# Patient Record
Sex: Male | Born: 1950 | Race: Black or African American | Hispanic: No | Marital: Married | Smoking: Current every day smoker
Health system: Southern US, Community
[De-identification: ages and names within clinical notes are randomized; demographics above are authoritative.]

## PROBLEM LIST (undated history)

## (undated) ENCOUNTER — Emergency Department (HOSPITAL_COMMUNITY): Payer: Medicare Other | Source: Home / Self Care

## (undated) ENCOUNTER — Emergency Department (HOSPITAL_COMMUNITY): Admission: EM | Payer: Medicare Other | Source: Home / Self Care

## (undated) DIAGNOSIS — D649 Anemia, unspecified: Secondary | ICD-10-CM

## (undated) DIAGNOSIS — F1910 Other psychoactive substance abuse, uncomplicated: Secondary | ICD-10-CM

## (undated) DIAGNOSIS — H409 Unspecified glaucoma: Secondary | ICD-10-CM

## (undated) DIAGNOSIS — N318 Other neuromuscular dysfunction of bladder: Secondary | ICD-10-CM

## (undated) DIAGNOSIS — G40909 Epilepsy, unspecified, not intractable, without status epilepticus: Secondary | ICD-10-CM

## (undated) DIAGNOSIS — Z9289 Personal history of other medical treatment: Secondary | ICD-10-CM

## (undated) DIAGNOSIS — S02400A Malar fracture unspecified, initial encounter for closed fracture: Secondary | ICD-10-CM

## (undated) DIAGNOSIS — F419 Anxiety disorder, unspecified: Secondary | ICD-10-CM

## (undated) DIAGNOSIS — S02401A Maxillary fracture, unspecified, initial encounter for closed fracture: Secondary | ICD-10-CM

## (undated) DIAGNOSIS — K5900 Constipation, unspecified: Secondary | ICD-10-CM

## (undated) DIAGNOSIS — I10 Essential (primary) hypertension: Secondary | ICD-10-CM

## (undated) DIAGNOSIS — R58 Hemorrhage, not elsewhere classified: Secondary | ICD-10-CM

## (undated) DIAGNOSIS — T7840XA Allergy, unspecified, initial encounter: Secondary | ICD-10-CM

## (undated) DIAGNOSIS — K219 Gastro-esophageal reflux disease without esophagitis: Secondary | ICD-10-CM

## (undated) DIAGNOSIS — B1920 Unspecified viral hepatitis C without hepatic coma: Secondary | ICD-10-CM

## (undated) DIAGNOSIS — K759 Inflammatory liver disease, unspecified: Secondary | ICD-10-CM

## (undated) DIAGNOSIS — S82251C Displaced comminuted fracture of shaft of right tibia, initial encounter for open fracture type IIIA, IIIB, or IIIC: Secondary | ICD-10-CM

## (undated) DIAGNOSIS — F0390 Unspecified dementia without behavioral disturbance: Secondary | ICD-10-CM

## (undated) DIAGNOSIS — F329 Major depressive disorder, single episode, unspecified: Secondary | ICD-10-CM

## (undated) DIAGNOSIS — S069X9A Unspecified intracranial injury with loss of consciousness of unspecified duration, initial encounter: Secondary | ICD-10-CM

## (undated) DIAGNOSIS — F32A Depression, unspecified: Secondary | ICD-10-CM

## (undated) DIAGNOSIS — J45909 Unspecified asthma, uncomplicated: Secondary | ICD-10-CM

## (undated) DIAGNOSIS — S72402A Unspecified fracture of lower end of left femur, initial encounter for closed fracture: Secondary | ICD-10-CM

## (undated) HISTORY — DX: Anemia, unspecified: D64.9

## (undated) HISTORY — DX: Allergy, unspecified, initial encounter: T78.40XA

## (undated) HISTORY — DX: Other psychoactive substance abuse, uncomplicated: F19.10

## (undated) HISTORY — DX: Anxiety disorder, unspecified: F41.9

## (undated) HISTORY — PX: ORIF ANKLE FRACTURE: SHX5408

## (undated) HISTORY — DX: Epilepsy, unspecified, not intractable, without status epilepticus: G40.909

---
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
## 2000-10-07 ENCOUNTER — Emergency Department (HOSPITAL_COMMUNITY): Admission: EM | Admit: 2000-10-07 | Discharge: 2000-10-08 | Payer: Self-pay | Admitting: *Deleted

## 2004-03-14 ENCOUNTER — Ambulatory Visit: Payer: Self-pay | Admitting: Family Medicine

## 2004-04-03 ENCOUNTER — Ambulatory Visit: Payer: Self-pay | Admitting: *Deleted

## 2004-07-05 ENCOUNTER — Ambulatory Visit (HOSPITAL_COMMUNITY): Admission: RE | Admit: 2004-07-05 | Discharge: 2004-07-05 | Payer: Self-pay | Admitting: Gastroenterology

## 2004-07-05 ENCOUNTER — Encounter (INDEPENDENT_AMBULATORY_CARE_PROVIDER_SITE_OTHER): Payer: Self-pay | Admitting: *Deleted

## 2004-09-11 ENCOUNTER — Ambulatory Visit: Payer: Self-pay | Admitting: Internal Medicine

## 2005-06-04 ENCOUNTER — Emergency Department (HOSPITAL_COMMUNITY): Admission: EM | Admit: 2005-06-04 | Discharge: 2005-06-04 | Payer: Self-pay | Admitting: Emergency Medicine

## 2005-09-19 ENCOUNTER — Emergency Department (HOSPITAL_COMMUNITY): Admission: EM | Admit: 2005-09-19 | Discharge: 2005-09-20 | Payer: Self-pay | Admitting: Emergency Medicine

## 2006-07-23 ENCOUNTER — Emergency Department (HOSPITAL_COMMUNITY): Admission: EM | Admit: 2006-07-23 | Discharge: 2006-07-24 | Payer: Self-pay | Admitting: Emergency Medicine

## 2006-08-19 ENCOUNTER — Emergency Department (HOSPITAL_COMMUNITY): Admission: EM | Admit: 2006-08-19 | Discharge: 2006-08-19 | Payer: Self-pay | Admitting: Emergency Medicine

## 2006-11-16 ENCOUNTER — Emergency Department (HOSPITAL_COMMUNITY): Admission: EM | Admit: 2006-11-16 | Discharge: 2006-11-16 | Payer: Self-pay | Admitting: Emergency Medicine

## 2007-05-16 ENCOUNTER — Ambulatory Visit: Payer: Self-pay | Admitting: Family Medicine

## 2007-05-16 LAB — CONVERTED CEMR LAB
AST: 49 units/L — ABNORMAL HIGH (ref 0–37)
BUN: 8 mg/dL (ref 6–23)
Basophils Relative: 0 % (ref 0–1)
CO2: 22 meq/L (ref 19–32)
Calcium: 9.7 mg/dL (ref 8.4–10.5)
Chloride: 106 meq/L (ref 96–112)
Creatinine, Ser: 0.83 mg/dL (ref 0.40–1.50)
Glucose, Bld: 90 mg/dL (ref 70–99)
Hemoglobin: 15.2 g/dL (ref 13.0–17.0)
Lymphs Abs: 2.4 10*3/uL (ref 0.7–4.0)
MCHC: 34.5 g/dL (ref 30.0–36.0)
Monocytes Absolute: 0.6 10*3/uL (ref 0.1–1.0)
Monocytes Relative: 10 % (ref 3–12)
Neutro Abs: 3.2 10*3/uL (ref 1.7–7.7)
PSA: 0.78 ng/mL (ref 0.10–4.00)
RBC: 4.81 M/uL (ref 4.22–5.81)
WBC: 6.3 10*3/uL (ref 4.0–10.5)

## 2008-04-06 ENCOUNTER — Ambulatory Visit: Payer: Self-pay | Admitting: Family Medicine

## 2008-11-25 ENCOUNTER — Ambulatory Visit: Payer: Self-pay | Admitting: Family Medicine

## 2008-12-22 ENCOUNTER — Encounter: Payer: Self-pay | Admitting: Internal Medicine

## 2008-12-22 ENCOUNTER — Ambulatory Visit (HOSPITAL_COMMUNITY): Admission: RE | Admit: 2008-12-22 | Discharge: 2008-12-22 | Payer: Self-pay | Admitting: Gastroenterology

## 2008-12-25 ENCOUNTER — Encounter: Payer: Self-pay | Admitting: Internal Medicine

## 2009-04-08 ENCOUNTER — Ambulatory Visit: Payer: Self-pay | Admitting: Family Medicine

## 2009-04-08 LAB — CONVERTED CEMR LAB
ALT: 62 units/L — ABNORMAL HIGH (ref 0–53)
Alkaline Phosphatase: 107 units/L (ref 39–117)
Basophils Absolute: 0 10*3/uL (ref 0.0–0.1)
Creatinine, Ser: 0.78 mg/dL (ref 0.40–1.50)
HCV Quantitative: 2830000 intl units/mL — ABNORMAL HIGH (ref <43)
INR: 1.1 (ref 0.0–1.5)
Lymphocytes Relative: 35 % (ref 12–46)
Lymphs Abs: 1.5 10*3/uL (ref 0.7–4.0)
Neutro Abs: 2.3 10*3/uL (ref 1.7–7.7)
Neutrophils Relative %: 52 % (ref 43–77)
Platelets: 179 10*3/uL (ref 150–400)
RDW: 13.6 % (ref 11.5–15.5)
Sodium: 141 meq/L (ref 135–145)
Total Bilirubin: 0.9 mg/dL (ref 0.3–1.2)
Total Protein: 7.2 g/dL (ref 6.0–8.3)
Vit D, 25-Hydroxy: 16 ng/mL — ABNORMAL LOW (ref 30–89)
WBC: 4.4 10*3/uL (ref 4.0–10.5)

## 2009-05-26 ENCOUNTER — Ambulatory Visit: Payer: Self-pay | Admitting: Gastroenterology

## 2009-10-31 ENCOUNTER — Emergency Department (HOSPITAL_COMMUNITY): Admission: EM | Admit: 2009-10-31 | Discharge: 2009-10-31 | Payer: Self-pay | Admitting: Emergency Medicine

## 2010-05-23 ENCOUNTER — Emergency Department (HOSPITAL_COMMUNITY)
Admission: EM | Admit: 2010-05-23 | Discharge: 2010-05-23 | Payer: Self-pay | Source: Home / Self Care | Admitting: Emergency Medicine

## 2010-09-05 LAB — COMPREHENSIVE METABOLIC PANEL
ALT: 177 U/L — ABNORMAL HIGH (ref 0–53)
Albumin: 3.8 g/dL (ref 3.5–5.2)
Alkaline Phosphatase: 109 U/L (ref 39–117)
BUN: 6 mg/dL (ref 6–23)
Chloride: 108 mEq/L (ref 96–112)
Potassium: 3.7 mEq/L (ref 3.5–5.1)
Sodium: 138 mEq/L (ref 135–145)
Total Bilirubin: 1 mg/dL (ref 0.3–1.2)
Total Protein: 6.7 g/dL (ref 6.0–8.3)

## 2010-12-12 ENCOUNTER — Encounter (HOSPITAL_BASED_OUTPATIENT_CLINIC_OR_DEPARTMENT_OTHER): Payer: Self-pay | Admitting: Psychology

## 2010-12-12 DIAGNOSIS — F1020 Alcohol dependence, uncomplicated: Secondary | ICD-10-CM

## 2010-12-12 DIAGNOSIS — F1220 Cannabis dependence, uncomplicated: Secondary | ICD-10-CM

## 2010-12-22 ENCOUNTER — Encounter (HOSPITAL_COMMUNITY): Payer: Self-pay | Admitting: Psychology

## 2010-12-29 ENCOUNTER — Encounter (HOSPITAL_COMMUNITY): Payer: Self-pay | Admitting: Psychology

## 2010-12-31 ENCOUNTER — Telehealth: Payer: Self-pay | Admitting: Gastroenterology

## 2010-12-31 NOTE — Telephone Encounter (Signed)
FINAL REPORT    [facility]                            [place], [place] [address]    Patient Name:  Stephen, Buckley Medical Record Number  [id number] Date of Service 09/06/2010 Attending Psychologist Stephen Buckley, Ph.D.  Primary Diagnosis: Chronic Hepatitis C (070.54) Evaluation Duration: 1 hour  REFERRING PHYSICIAN: Tina Griffiths, PA-C, and Dr. Jacqualine Buckley, [facility] Hepatology   CONFIDENTIAL PSYCHOLOGICAL EVALUATIOON  Stephen Buckley is a 60 year old partnered African-American male from [place], who has been diagnosed with hepatitis C (HCV). He was seen in consultation today at the request of Stephen Griffiths, PA-C, to assess his psychosocial functioning and appropriateness for HCV treatment.  BEHAVIORAL OBSERVATIONS: On time; brought to visit by partner but unwilling to have her in the interview.  MENTAL STATUS EXAM:                                             Appearance: Dressed and groomed adequately; poor dentition       Behavior: Calm; no psychomotor agitation or retardation          Attitude: Cooperative, honest and forthcoming                    Speech: Audible and appropriate                                  Mood: "good"                                                     Affect: Normal range                                             Thought Process: Linear, logical and goal-directed               Thought Content: Appropriate to interview; Denies SI/HI, intent  or plan Insight: Poor                                            Judgment: Poor  HCV HISTORY:                                                    Diagnosed: 5-6 years ago                                         Genotype: 1  Il28b: TT                                                        Liver Disease Status: Grade 3, Stage 0 per 2006 biopsy           HCV Treatment Status: Naive                                      Possible mode of transmission: Injection  drug abuse, intranasal  cocaine abuse  Knowledge of HCV: Below average                   Knowledge of antiviral therapy regimens: Below average  MEDICAL HISTORY: HCV; osteoarthritis; bronchitis                Subjective Health Rating (0:Poor to 10:Excellent): 6             Primary complaints include: chest pains, lacerations inside of nostrils Medications: Rescue inhaler prn                                  Medication Adherence: No barriers identified  LIFESTYLE HABITS:                                               Nicotine: Half PPD                                               Caffeine: 1-2 cups coffee yesterday; typically does not consume  caffeine Nutrition/Appetite: Fair appetite; skips lunch          Weight Change: Denied                                            Sleep: Averages 6 hours/night  FAMILY AND SOCIAL FUNCTIONING:                                  Born/Raised: [place]                                                   Early Family Life: "normal" but very poor; raised by mother; denied abuse or neglect; 2 brothers/1 sister living; 1 brother and 1 sister deceased   Marital Status: Never married; male partner x 8 years Stephen Buckley)  Children: None  Current Family Life/Environment: Living w/Stephen Buckley in apartment;  living situation is stable                                        Social Support: Primary school teacher, Education officer, environmental, friends  FAMILY AND SOCIAL FUNCTIONING COMMENT: Transportation barriers: has driver's license, but no reliable transportation; indicated  that he could take a bus from [place] to [place], but travel times would restrict clinic visit times.  EDUCATIONAL STATUS: High school graduate; no difficulties with  reading; some difficulty with writing.  OCCUPATIONAL STATUS: Unemployed since 11/2006; Location manager  by trade; history of work-related difficulties resulting in termination. Minimal income from odd-end jobs; Stephen Buckley  is also unemployed; pt  has never applied for disability; uninsured; has not applied to  [facility]; was referred to financial counselor after visit.  SUBSTANCE USE HISTORY:                                           Illicit Drug Use: Injection drug abuse 1-2 x in past (>20 years ago); used intranasal cocaine then crack cocaine daily from 1994 to 2004. Stopped cocaine use 04/2003 after developed breathing issues w/bronchitis. Used marijuana daily as a teenager, denied current use.                                    Licit Drug Abuse/Misuse: Pill abuse as a teenager                Alcohol Use: Drug of choice; chronic alcohol dependence; 3 x 24oz beers weekdays, up to 12 beers on weekend days. Continues to consume daily.         Past Treatment and Status: [facility] 2004 to abstain from cocaine; [facility] (treatment [facility]) in 2009 to reduce alcohol use. Briefly Alcoholics Anonymous (AA) in past. No current treatment. Longest period of abstinence was 4 years, which was primarily during his period of daily crack cocaine abuse.                   Current Status: Continues to consume alcohol daily, at same rates previously reported. Was advised to abstain by Dr. Jacqualine Buckley in 05/2009. Advised again by Stephen Buckley in 05/2010. Has made no attempt to reduce alcohol use. Discussed alcohol use at length today. Used  motivational interviewing to discuss patient's readiness for change. Patient receptive about health risks for HCV and impact on HCV treatment, especially given TT IL28b. Understands HCV treatment unlikely until alcohol dependence addressed. Patient claimed willingness to seek treatment at AA. Not interested in structured substance abuse treatment. Partner Stephen Buckley also consumes alcohol regularly, but pt was unwilling to bring her into visit to discuss need for pt to cut back.  Substance Use Comment: Per [place] [employer] offender record, he has a significant  drug/alcohol related legal history. Most recent was drunk   disorderly conduct, conviction 08/12/09. He served probation; no longer on probation.  MENTAL HEALTH HISTORY: No evidence of primary mental illness.   Psychiatric Hospitalizations: Denied                              Suicide Attempts: Denied  Suicidal or Homicidal Ideation: Denied                           Self-Injurious Behavior: Denied                                  Family Mental Health History: Unknown                            Psychiatric medication: Denied                                   Counseling or other Psychological Treatments: None outside of substance abuse treatment.                                                  Psychiatric diagnoses: Substance-related diagnoses only          Current Symptoms: No current sxs; pt denied any history of Major Depression, anxiety, Bipolar-spectrum, or psychotic-spectrum sxs. CES-D=23 in 05/2009; pt noted that he likely felt down because of no job, but denied sxs persisted for 2 weeks or more.  MOTIVATION FOR TREATMENT: Patient somewhat motivated for HCV treatment. After discussion of alcohol, he claimed that "this meeting" convinced him to stop drinking if he is ever to get treatment. However, it is unclear whether this was genuine.  CLINICAL IMPRESSIONS: Mr. Stephen Buckley is a 25 year old partnered African-American male with HCV, genotype 1A. He has a TT IL28b and Grade 3, Stage 0 disease per 2006 biopsy. He is not a candidate  for HCV treatment at this time given his ongoing alcohol abuse.  He was advised by the Liver team on 2 occasions (05/2009 and 05/2010) to reduce his alcohol use but has yet to make any reductions in  use. Today, after a lengthy discussion and use of motivational interviewing skills, he indicated his interest in abstaining from alcohol and becoming eligible for treatment. However, barriers for  successful abstinence include a partner who regularly consumes alcohol in the home with the patient. He was unwilling to bring her into the interview to discuss his HCV or need to abstain from alcohol. He was also  unwilling to consider structured substance abuse treatment. He indicated interest in rejoining AA and he will discuss his alcohol use with his pastor, with whom he shares a close relationship. We also offered to help him locate treatment services if he so desires. Additionally, other barriers for HCV treatment include limited transportation  from [place] to attend [facility] visits. He agreed to work on these barriers concurrently, and will follow through with establishing [facility] while addressing his alcohol use. We recommend  that he return for follow up in approximately 6-12 months to reassess his readiness for treatment.  DSM-IV PSYCHIATRIC DIAGNOSES:                                   AXIS I: Alcohol Dependence without Physiological Dependence; CocaineD ependence, in Sustained Full Remission  AXIS II: Deferred                                                AXIS III: HCV; osteoarthritis; bronchitis                        AXIS IV: occupational, financial, healthcare access  TREATMENT RECOMMENDATIONS AND PLAN: 1. Stephen Buckley is not a candidate for HCV treatment at this  time due to ongoing alcohol dependence. He expressed interest in returning to Alcoholics Anonymous, but he declined our offer to  assist him in finding more formal treatment programs. 2. He also needs to establish [facility] because he is uninsured. He agreed to follow up with the financial counselors after today's visit. 3. He will also need to establish reliable transportation to attend future and on-treatment [facility] visits. 4. We recommend his referring physician assess his ongoing alcohol use and when he has been sober for 6-12 months, he can be referred back to [place]  for re-evaluation for HCV treatment. Daily and excessive alcohol usse decreases the efficacy of IFN treatment and is a contraindication for treatment.  5. We provided him with our contact numbers and he may contact  us with further questions or concerns.  Scherrie Gerlach. Colvin Caroli, Ph.D.                                          Psychology Postdoctoral Fellow  Cecil Cranker. Sander Radon, Ph.D.                                            Licensed Clinical Psychologist [phone]                             Asst Professor of Medicine                                       [facility] Hepatology  dd 09/06/2010 by Stephen Buckley, Ph.D. dt 09/11/2010  9:38 A by jjb:       Electronically signed on 09/15/2010 by Stephen Buckley

## 2011-01-02 ENCOUNTER — Encounter (HOSPITAL_COMMUNITY): Payer: Self-pay | Admitting: Psychology

## 2011-03-15 ENCOUNTER — Ambulatory Visit (INDEPENDENT_AMBULATORY_CARE_PROVIDER_SITE_OTHER): Payer: Self-pay | Admitting: Gastroenterology

## 2011-03-15 ENCOUNTER — Other Ambulatory Visit: Payer: Self-pay | Admitting: Gastroenterology

## 2011-03-15 DIAGNOSIS — B182 Chronic viral hepatitis C: Secondary | ICD-10-CM

## 2011-03-16 LAB — CBC WITH DIFFERENTIAL/PLATELET
Basophils Absolute: 0 10*3/uL (ref 0.0–0.1)
Basophils Relative: 0 % (ref 0–1)
Eosinophils Absolute: 0.1 10*3/uL (ref 0.0–0.7)
Eosinophils Relative: 1 % (ref 0–5)
HCT: 41.6 % (ref 39.0–52.0)
Lymphocytes Relative: 48 % — ABNORMAL HIGH (ref 12–46)
MCH: 31.1 pg (ref 26.0–34.0)
MCHC: 35.3 g/dL (ref 30.0–36.0)
MCV: 88.1 fL (ref 78.0–100.0)
Monocytes Absolute: 0.6 10*3/uL (ref 0.1–1.0)
Platelets: 177 10*3/uL (ref 150–400)
RDW: 13 % (ref 11.5–15.5)

## 2011-03-16 LAB — COMPREHENSIVE METABOLIC PANEL
BUN: 8 mg/dL (ref 6–23)
CO2: 22 mEq/L (ref 19–32)
Glucose, Bld: 77 mg/dL (ref 70–99)
Sodium: 140 mEq/L (ref 135–145)
Total Bilirubin: 0.5 mg/dL (ref 0.3–1.2)
Total Protein: 7.2 g/dL (ref 6.0–8.3)

## 2011-03-19 LAB — INTERLEUKIN 28B POLYMORPHISM GENOTYPE, RT-PCR

## 2011-03-20 LAB — HEPATITIS C GENOTYPE

## 2011-03-22 NOTE — Progress Notes (Signed)
NAME:  Stephen Buckley, Stephen Buckley  MR#:  [id number]      DATE:  03/15/2011  DOB:  1950/11/15    cc: Referring Physician:  Dow Adolph, MD, Health [address], [address], [place], [place] [address], Fax [phone]    REASON FOR VISIT:  Followup genotype 1 hepatitis C.    History:  The patient returns today accompanied by his girlfriend. He is a 60 year old gentleman with history of genotype 1 hepatitis C (subtype unknown), IL 28 B TT, with a liver biopsy on 06/30/2004, showing grade  3, stage zero disease without significant iron accumulation. He is naive to treatment. When last seen on 06/13/2010, he was not considered eligible for treatment because he was consuming 6 beers per  day. In addition, there was thinking he would wait for triple therapy. He has not been seen since but returns today reporting that he has been abstinent from alcohol completely for the last 7 months. There  are no symptoms to suggest decompensated or cryoglobulin mediated disease.   PAST MEDICAL HISTORY:  Significant for back pain and left knee pain for which he is on baclofen. There been no other interval events.   CURRENT MEDICATIONS:  Vitamin D 50,000 units p.o. weekly, baclofen 10 mg p.o. daily p.r.n., sertraline at 25 mg p.o. daily, pantoprazole 40 mg p.o. daily.   ALLERGIES:  None.   habits:  Smoking, a few cigarettes on a daily basis. Alcohol denies for the last 7 months. Stopped on his own without any counseling.   REVIEW OF SYSTEMS:  All 10 systems reviewed today with the patient and they are negative other than which was mentioned above. He did not complete the CES-D.   PHYSICAL EXAMINATION:  Constitutional:  Appears stated age without significant peripheral wasting. Vital signs: Height 69 inches, weight 158 pounds, blood pressure 127/83, pulse 73, temperature is 97.7 Fahrenheit.  Ears,  nose, mouth and throat:  Poor dentition. Unremarkable oropharynx.  No thyromegaly or neck masses.   Chest:  Resonant to percussion.  Clear to auscultation.  Cardiovascular:  Heart sounds normal S1, S2 without  murmurs or rubs.  There is no peripheral edema.  Abdominal:  Normal bowel sounds.  No masses or tenderness.  I could not appreciate a liver edge or spleen tip.  I could not appreciate any hernias.   Lymphatics:  No cervical or inguinal lymphadenopathy.  Central Nervous System:  No asterixis or focal neurologic findings.  Dermatologic:   Anicteric without palmar erythema or spider angiomata.  Eyes:  Anicteric sclerae.  Pupils are equal and reactive to light.   Laboratories:  There are no more recent labs since 06/13/2010, at which time his total bilirubin was 0.6, albumin 4.1. INR 1.0, AST 125, ALT 117, ALP 226, GGT 848, TSH was normal at 0.7, CBC was unremarkable. Iron  saturation 47% with a ferritin of 397. His HCV RNA was 4,696,295 international units per mL.   ASSESSMENT:  The patient is a 60 year old gentleman with history of genotype 1 (subtype unknown) IL 28 B TT genotype, with biopsy on 06/30/2004, showing grade 3, stage zero disease. He has not been treated to date.  He presents as a much better candidate for therapy with triple therapy, however I think it is worth restaging his disease to see if he can still what wait until perhaps the availability of [facility] 435 or  whether we should go ahead and treat him now. In addition, I need to update his lab work since last being  seen over 9 months ago. If there is progression of fibrosis on biopsy, then it may be worth treating  him now rather than waiting for [facility] 435.  In my discussion today with the patient and his girlfriend, we discussed treatment with triple therapy. We discussed his previous genotyping and his biopsy findings. We discussed his treatment with  pegylated interferon and ribavirin and a protease inhibitor. I discussed the psychiatric, constitutional, and specific system side effects, as well as response rates. We discussed the  fact it would be  worth getting another biopsy and liver tests to see where we are at in terms of severity disease before making a final decision on treatment. He was agreeable to this.   PLAN:  1. Liver enzymes today.  2. Arrange for liver biopsy in the coming weeks. 3. He was previously vaccinated against hepatitis A and B by report. 4. Followup to be arranged after his biopsy is complete to discuss results.            Brooke Dare, MD   ADDENDUM G 1a  IL28B TT   ALT 73  403 .[id number]  D:  Thu Sep 20 14:39:49 2012 ; T:  Thu Sep 20 15:14:09 2012  Job #:  [id number]

## 2011-03-23 ENCOUNTER — Other Ambulatory Visit: Payer: Self-pay | Admitting: Gastroenterology

## 2011-03-23 DIAGNOSIS — B182 Chronic viral hepatitis C: Secondary | ICD-10-CM

## 2011-04-03 ENCOUNTER — Ambulatory Visit (HOSPITAL_COMMUNITY)
Admission: RE | Admit: 2011-04-03 | Discharge: 2011-04-03 | Disposition: A | Discharge status: Home / Self Care | Payer: Self-pay | Source: Ambulatory Visit | Attending: Gastroenterology | Admitting: Gastroenterology

## 2011-04-03 ENCOUNTER — Other Ambulatory Visit: Payer: Self-pay | Admitting: Interventional Radiology

## 2011-04-03 DIAGNOSIS — Z01812 Encounter for preprocedural laboratory examination: Secondary | ICD-10-CM | POA: Insufficient documentation

## 2011-04-03 DIAGNOSIS — F17200 Nicotine dependence, unspecified, uncomplicated: Secondary | ICD-10-CM | POA: Insufficient documentation

## 2011-04-03 DIAGNOSIS — K219 Gastro-esophageal reflux disease without esophagitis: Secondary | ICD-10-CM | POA: Insufficient documentation

## 2011-04-03 DIAGNOSIS — B182 Chronic viral hepatitis C: Secondary | ICD-10-CM | POA: Insufficient documentation

## 2011-04-03 LAB — CBC
MCH: 32.1 pg (ref 26.0–34.0)
MCHC: 36.2 g/dL — ABNORMAL HIGH (ref 30.0–36.0)
MCV: 88.6 fL (ref 78.0–100.0)
Platelets: 172 10*3/uL (ref 150–400)
RBC: 4.58 MIL/uL (ref 4.22–5.81)
RDW: 13.2 % (ref 11.5–15.5)

## 2011-04-03 LAB — PROTIME-INR: Prothrombin Time: 13.6 seconds (ref 11.6–15.2)

## 2011-04-05 ENCOUNTER — Encounter: Payer: Self-pay | Admitting: Gastroenterology

## 2011-07-11 ENCOUNTER — Emergency Department (HOSPITAL_COMMUNITY)
Admission: EM | Admit: 2011-07-11 | Discharge: 2011-07-11 | Disposition: A | Discharge status: Home / Self Care | Payer: Self-pay | Attending: Emergency Medicine | Admitting: Emergency Medicine

## 2011-07-11 ENCOUNTER — Encounter (HOSPITAL_COMMUNITY): Payer: Self-pay | Admitting: Emergency Medicine

## 2011-07-11 DIAGNOSIS — S61209A Unspecified open wound of unspecified finger without damage to nail, initial encounter: Secondary | ICD-10-CM | POA: Insufficient documentation

## 2011-07-11 DIAGNOSIS — Y92009 Unspecified place in unspecified non-institutional (private) residence as the place of occurrence of the external cause: Secondary | ICD-10-CM | POA: Insufficient documentation

## 2011-07-11 DIAGNOSIS — S61019A Laceration without foreign body of unspecified thumb without damage to nail, initial encounter: Secondary | ICD-10-CM

## 2011-07-11 DIAGNOSIS — W268XXA Contact with other sharp object(s), not elsewhere classified, initial encounter: Secondary | ICD-10-CM | POA: Insufficient documentation

## 2011-07-11 HISTORY — DX: Gastro-esophageal reflux disease without esophagitis: K21.9

## 2011-07-11 HISTORY — DX: Major depressive disorder, single episode, unspecified: F32.9

## 2011-07-11 HISTORY — DX: Essential (primary) hypertension: I10

## 2011-07-11 HISTORY — DX: Depression, unspecified: F32.A

## 2011-07-11 MED ORDER — LIDOCAINE HCL 1 % IJ SOLN
2.0000 mL | Freq: Once | INTRAMUSCULAR | Status: AC
  Administered 2011-07-11: 2 mL via INTRADERMAL

## 2011-07-11 MED ORDER — TETANUS-DIPHTH-ACELL PERTUSSIS 5-2.5-18.5 LF-MCG/0.5 IM SUSP
INTRAMUSCULAR | Status: AC
  Administered 2011-07-11: 0.5 mL via INTRAMUSCULAR
  Filled 2011-07-11: qty 0.5

## 2011-07-11 MED ORDER — TETANUS-DIPHTHERIA TOXOIDS TD 5-2 LFU IM INJ: 0.5000 mL | INJECTION | Freq: Once | INTRAMUSCULAR | Status: DC

## 2011-07-11 MED ORDER — LIDOCAINE HCL 1 % IJ SOLN
INTRAMUSCULAR | Status: AC
  Administered 2011-07-11: 10:00:00
  Filled 2011-07-11: qty 20

## 2011-07-11 NOTE — ED Notes (Signed)
Patient has lacteration to left thumb, patient states " I was cleaning aluminum with a razor blade and cut myself"

## 2011-07-11 NOTE — ED Notes (Signed)
Patient verbalized understanding of discharge instuctions.

## 2011-07-11 NOTE — ED Provider Notes (Signed)
History     CSN: [id number]  Arrival date & time 07/11/11  4098   First MD Initiated Contact with Patient 07/11/11 [phone]      No chief complaint on file.   (Consider location/radiation/quality/duration/timing/severity/associated sxs/prior treatment) The history is provided by the patient.  pt c/o laceration to left thumb. Accidentally occurred at home w razor blade while cleaing a piece of aluminum. No other injury. Right hand dominant. Constant, dull pain localized to cut. Worse w palpation. No numbness/weakness or limited rom. Tetanus unknown.   No past medical history on file.  No past surgical history on file.  No family history on file.  History  Substance Use Topics  . Smoking status: Not on file  . Smokeless tobacco: Not on file  . Alcohol Use: Not on file      Review of Systems  Constitutional: Negative for fever.  Gastrointestinal: Negative for nausea.  Skin: Positive for wound.  Neurological: Negative for numbness.    Allergies  Review of patient's allergies indicates not on file.  Home Medications  No current outpatient prescriptions on file.  BP 120/83  Pulse 72  Temp(Src) 98.2 F (36.8 C) (Oral)  Resp 16  SpO2 100%  Physical Exam  Nursing note and vitals reviewed. Constitutional: He is oriented to person, place, and time. He appears well-developed and well-nourished. No distress.  HENT:  Head: Atraumatic.  Eyes: Pupils are equal, round, and reactive to light.  Neck: Neck supple. No tracheal deviation present.  Cardiovascular: Normal rate.   Pulmonary/Chest: Effort normal. No accessory muscle usage. No respiratory distress.  Musculoskeletal: Normal range of motion.       1 cm laceration to dorsum left thumb. No joint exposed. Good rom. Normal cap refill distally. Sensation intact distally.   Neurological: He is alert and oriented to person, place, and time.  Skin: Skin is warm and dry.  Psychiatric: He has a normal mood and affect.    ED  Course  Procedures (including critical care time)     MDM  Wound sutured.   LACERATION REPAIR Performed by: Suzi Roots Authorized by: Suzi Roots Consent: Verbal consent obtained. Risks and benefits: risks, benefits and alternatives were discussed Consent given by: patient Patient identity confirmed: provided demographic data Prepped and Draped in normal sterile fashion Wound explored  Laceration Location: left thumb  Laceration Length: 1cm  No Foreign Bodies seen or palpated  Anesthesia: local infiltration  Local anesthetic: lidocaine 2% wo epinephrine  Anesthetic total: 1 ml  Irrigation method: syringe Amount of cleaning: standard  Skin closure: 4-0 prolene  Number of sutures: 2  Technique: simple interrupted  Patient tolerance: Patient tolerated the procedure well with no immediate complications.  No fb seen or felt.      tetnaus im.      Suzi Roots, MD 07/11/11 [phone]

## 2011-07-11 NOTE — ED Notes (Signed)
Pt states cut his lt thumb with a box cutter, bleeding active

## 2012-01-24 DIAGNOSIS — S069X9A Unspecified intracranial injury with loss of consciousness of unspecified duration, initial encounter: Secondary | ICD-10-CM

## 2012-01-24 DIAGNOSIS — S069XAA Unspecified intracranial injury with loss of consciousness status unknown, initial encounter: Secondary | ICD-10-CM

## 2012-01-24 HISTORY — DX: Unspecified intracranial injury with loss of consciousness of unspecified duration, initial encounter: S06.9X9A

## 2012-01-24 HISTORY — DX: Unspecified intracranial injury with loss of consciousness status unknown, initial encounter: S06.9XAA

## 2012-02-07 ENCOUNTER — Ambulatory Visit: Payer: Self-pay | Admitting: Gastroenterology

## 2012-02-14 ENCOUNTER — Ambulatory Visit (INDEPENDENT_AMBULATORY_CARE_PROVIDER_SITE_OTHER): Payer: Self-pay | Admitting: Gastroenterology

## 2012-02-14 DIAGNOSIS — B182 Chronic viral hepatitis C: Secondary | ICD-10-CM

## 2012-02-21 ENCOUNTER — Emergency Department (HOSPITAL_COMMUNITY): Payer: Medicaid Other

## 2012-02-21 ENCOUNTER — Inpatient Hospital Stay (HOSPITAL_COMMUNITY): Payer: Medicaid Other

## 2012-02-21 ENCOUNTER — Encounter (HOSPITAL_COMMUNITY): Payer: Self-pay | Admitting: *Deleted

## 2012-02-21 ENCOUNTER — Inpatient Hospital Stay (HOSPITAL_COMMUNITY): Payer: Medicaid Other | Admitting: *Deleted

## 2012-02-21 ENCOUNTER — Inpatient Hospital Stay (HOSPITAL_COMMUNITY)
Admission: EM | Admit: 2012-02-21 | Discharge: 2012-03-03 | DRG: 956 | Disposition: A | Discharge status: Rehabilitation Facility | Payer: Medicaid Other | Attending: General Surgery | Admitting: General Surgery

## 2012-02-21 ENCOUNTER — Encounter (HOSPITAL_COMMUNITY): Admission: EM | Disposition: A | Discharge status: Rehabilitation Facility | Payer: Self-pay | Source: Home / Self Care

## 2012-02-21 DIAGNOSIS — S82209B Unspecified fracture of shaft of unspecified tibia, initial encounter for open fracture type I or II: Secondary | ICD-10-CM

## 2012-02-21 DIAGNOSIS — S02109A Fracture of base of skull, unspecified side, initial encounter for closed fracture: Secondary | ICD-10-CM | POA: Diagnosis present

## 2012-02-21 DIAGNOSIS — R29898 Other symptoms and signs involving the musculoskeletal system: Secondary | ICD-10-CM | POA: Diagnosis present

## 2012-02-21 DIAGNOSIS — J95821 Acute postprocedural respiratory failure: Secondary | ICD-10-CM

## 2012-02-21 DIAGNOSIS — F17200 Nicotine dependence, unspecified, uncomplicated: Secondary | ICD-10-CM | POA: Diagnosis present

## 2012-02-21 DIAGNOSIS — IMO0002 Reserved for concepts with insufficient information to code with codable children: Secondary | ICD-10-CM | POA: Diagnosis present

## 2012-02-21 DIAGNOSIS — S7290XA Unspecified fracture of unspecified femur, initial encounter for closed fracture: Secondary | ICD-10-CM

## 2012-02-21 DIAGNOSIS — S022XXA Fracture of nasal bones, initial encounter for closed fracture: Secondary | ICD-10-CM | POA: Diagnosis present

## 2012-02-21 DIAGNOSIS — S82409B Unspecified fracture of shaft of unspecified fibula, initial encounter for open fracture type I or II: Secondary | ICD-10-CM

## 2012-02-21 DIAGNOSIS — S72309A Unspecified fracture of shaft of unspecified femur, initial encounter for closed fracture: Secondary | ICD-10-CM | POA: Diagnosis present

## 2012-02-21 DIAGNOSIS — J9600 Acute respiratory failure, unspecified whether with hypoxia or hypercapnia: Secondary | ICD-10-CM | POA: Diagnosis present

## 2012-02-21 DIAGNOSIS — D62 Acute posthemorrhagic anemia: Secondary | ICD-10-CM | POA: Diagnosis present

## 2012-02-21 DIAGNOSIS — S7292XA Unspecified fracture of left femur, initial encounter for closed fracture: Secondary | ICD-10-CM

## 2012-02-21 DIAGNOSIS — S82251C Displaced comminuted fracture of shaft of right tibia, initial encounter for open fracture type IIIA, IIIB, or IIIC: Secondary | ICD-10-CM | POA: Diagnosis present

## 2012-02-21 DIAGNOSIS — Y92410 Unspecified street and highway as the place of occurrence of the external cause: Secondary | ICD-10-CM

## 2012-02-21 DIAGNOSIS — S0240DA Maxillary fracture, left side, initial encounter for closed fracture: Secondary | ICD-10-CM | POA: Diagnosis present

## 2012-02-21 DIAGNOSIS — S72402A Unspecified fracture of lower end of left femur, initial encounter for closed fracture: Secondary | ICD-10-CM | POA: Diagnosis present

## 2012-02-21 DIAGNOSIS — F3289 Other specified depressive episodes: Secondary | ICD-10-CM | POA: Diagnosis present

## 2012-02-21 DIAGNOSIS — S065X9A Traumatic subdural hemorrhage with loss of consciousness of unspecified duration, initial encounter: Secondary | ICD-10-CM

## 2012-02-21 DIAGNOSIS — S0231XA Fracture of orbital floor, right side, initial encounter for closed fracture: Secondary | ICD-10-CM | POA: Diagnosis present

## 2012-02-21 DIAGNOSIS — S06339A Contusion and laceration of cerebrum, unspecified, with loss of consciousness of unspecified duration, initial encounter: Secondary | ICD-10-CM | POA: Diagnosis present

## 2012-02-21 DIAGNOSIS — F329 Major depressive disorder, single episode, unspecified: Secondary | ICD-10-CM | POA: Diagnosis present

## 2012-02-21 DIAGNOSIS — S0280XA Fracture of other specified skull and facial bones, unspecified side, initial encounter for closed fracture: Secondary | ICD-10-CM | POA: Diagnosis present

## 2012-02-21 DIAGNOSIS — S066X9A Traumatic subarachnoid hemorrhage with loss of consciousness of unspecified duration, initial encounter: Secondary | ICD-10-CM | POA: Diagnosis present

## 2012-02-21 DIAGNOSIS — S069X9A Unspecified intracranial injury with loss of consciousness of unspecified duration, initial encounter: Secondary | ICD-10-CM

## 2012-02-21 DIAGNOSIS — M79609 Pain in unspecified limb: Secondary | ICD-10-CM

## 2012-02-21 HISTORY — PX: FASCIOTOMY: SHX132

## 2012-02-21 HISTORY — DX: Unspecified fracture of lower end of left femur, initial encounter for closed fracture: S72.402A

## 2012-02-21 HISTORY — PX: TIBIA IM NAIL INSERTION: SHX2516

## 2012-02-21 HISTORY — PX: FEMORAL ARTERY EXPLORATION: SHX5160

## 2012-02-21 HISTORY — PX: APPLICATION OF WOUND VAC: SHX5189

## 2012-02-21 HISTORY — PX: I & D EXTREMITY: SHX5045

## 2012-02-21 HISTORY — DX: Displaced comminuted fracture of shaft of right tibia, initial encounter for open fracture type IIIA, IIIB, or IIIC: S82.251C

## 2012-02-21 HISTORY — PX: FEMUR IM NAIL: SHX1597

## 2012-02-21 LAB — RAPID URINE DRUG SCREEN, HOSP PERFORMED
Amphetamines: NOT DETECTED
Barbiturates: NOT DETECTED
Benzodiazepines: POSITIVE — AB

## 2012-02-21 LAB — CBC
HCT: 37.3 % — ABNORMAL LOW (ref 39.0–52.0)
Hemoglobin: 12.9 g/dL — ABNORMAL LOW (ref 13.0–17.0)
MCH: 30.6 pg (ref 26.0–34.0)
MCV: 88.4 fL (ref 78.0–100.0)
Platelets: 162 10*3/uL (ref 150–400)
RBC: 4.22 MIL/uL (ref 4.22–5.81)
WBC: 8.2 10*3/uL (ref 4.0–10.5)

## 2012-02-21 LAB — COMPREHENSIVE METABOLIC PANEL
AST: 73 U/L — ABNORMAL HIGH (ref 0–37)
BUN: 11 mg/dL (ref 6–23)
CO2: 22 mEq/L (ref 19–32)
Calcium: 9 mg/dL (ref 8.4–10.5)
Chloride: 106 mEq/L (ref 96–112)
Creatinine, Ser: 0.73 mg/dL (ref 0.50–1.35)
GFR calc Af Amer: >90 mL/min (ref >90)
GFR calc non Af Amer: >90 mL/min (ref >90)
Glucose, Bld: 152 mg/dL — ABNORMAL HIGH (ref 70–99)
Total Bilirubin: 0.4 mg/dL (ref 0.3–1.2)

## 2012-02-21 LAB — PROTIME-INR: INR: 1.13 (ref 0.00–1.49)

## 2012-02-21 LAB — POCT I-STAT, CHEM 8
Chloride: 107 mEq/L (ref 96–112)
Creatinine, Ser: 0.8 mg/dL (ref 0.50–1.35)
Glucose, Bld: 147 mg/dL — ABNORMAL HIGH (ref 70–99)
Potassium: 3.6 mEq/L (ref 3.5–5.1)

## 2012-02-21 LAB — LACTIC ACID, PLASMA: Lactic Acid, Venous: 1.4 mmol/L (ref 0.5–2.2)

## 2012-02-21 LAB — ABO/RH: ABO/RH(D): B POS

## 2012-02-21 SURGERY — INSERTION, INTRAMEDULLARY ROD, TIBIA
Anesthesia: General | Site: Leg Upper | Laterality: Right | Wound class: Dirty or Infected

## 2012-02-21 MED ORDER — HYDROMORPHONE HCL PF 1 MG/ML IJ SOLN
INTRAMUSCULAR | Status: AC
  Filled 2012-02-21: qty 1

## 2012-02-21 MED ORDER — FENTANYL CITRATE 0.05 MG/ML IJ SOLN
INTRAMUSCULAR | Status: AC
  Filled 2012-02-21: qty 2

## 2012-02-21 MED ORDER — MIDAZOLAM HCL 2 MG/2ML IJ SOLN
INTRAMUSCULAR | Status: AC
  Filled 2012-02-21: qty 2

## 2012-02-21 MED ORDER — DOCUSATE SODIUM 100 MG PO CAPS
100.0000 mg | ORAL_CAPSULE | Freq: Two times a day (BID) | ORAL | Status: DC
  Filled 2012-02-21 (×3): qty 1

## 2012-02-21 MED ORDER — CEFAZOLIN SODIUM 1-5 GM-% IV SOLN
1.0000 g | Freq: Once | INTRAVENOUS | Status: AC
  Administered 2012-02-21: 1 g via INTRAVENOUS
  Filled 2012-02-21: qty 100

## 2012-02-21 MED ORDER — HYDROMORPHONE HCL PF 1 MG/ML IJ SOLN
1.0000 mg | INTRAMUSCULAR | Status: DC | PRN
  Administered 2012-02-21 (×2): 1 mg via INTRAVENOUS

## 2012-02-21 MED ORDER — FENTANYL BOLUS VIA INFUSION
50.0000 ug | Freq: Four times a day (QID) | INTRAVENOUS | Status: DC | PRN
  Filled 2012-02-21: qty 100

## 2012-02-21 MED ORDER — PROPOFOL 10 MG/ML IV EMUL
INTRAVENOUS | Status: DC | PRN
  Administered 2012-02-21: 200 mg via INTRAVENOUS

## 2012-02-21 MED ORDER — IOHEXOL 300 MG/ML  SOLN
80.0000 mL | Freq: Once | INTRAMUSCULAR | Status: AC | PRN
  Administered 2012-02-21: 80 mL via INTRAVENOUS

## 2012-02-21 MED ORDER — CEFAZOLIN SODIUM-DEXTROSE 2-3 GM-% IV SOLR
INTRAVENOUS | Status: DC | PRN
  Administered 2012-02-21 (×2): 2 g via INTRAVENOUS

## 2012-02-21 MED ORDER — MIDAZOLAM BOLUS VIA INFUSION
1.0000 mg | INTRAVENOUS | Status: DC | PRN
  Filled 2012-02-21 (×2): qty 2

## 2012-02-21 MED ORDER — FENTANYL CITRATE 0.05 MG/ML IJ SOLN
100.0000 ug | Freq: Once | INTRAMUSCULAR | Status: AC
  Administered 2012-02-21: 100 ug via INTRAVENOUS

## 2012-02-21 MED ORDER — ONDANSETRON HCL 4 MG/2ML IJ SOLN: 4.0000 mg | Freq: Four times a day (QID) | INTRAMUSCULAR | Status: DC | PRN

## 2012-02-21 MED ORDER — CEFAZOLIN SODIUM 1-5 GM-% IV SOLN
1.0000 g | Freq: Three times a day (TID) | INTRAVENOUS | Status: DC
  Administered 2012-02-22 – 2012-02-26 (×13): 1 g via INTRAVENOUS
  Filled 2012-02-21 (×15): qty 50

## 2012-02-21 MED ORDER — ROCURONIUM BROMIDE 100 MG/10ML IV SOLN
INTRAVENOUS | Status: DC | PRN
  Administered 2012-02-21: 20 mg via INTRAVENOUS
  Administered 2012-02-21: 50 mg via INTRAVENOUS
  Administered 2012-02-21: 10 mg via INTRAVENOUS
  Administered 2012-02-21: 20 mg via INTRAVENOUS

## 2012-02-21 MED ORDER — SUCCINYLCHOLINE CHLORIDE 20 MG/ML IJ SOLN
INTRAMUSCULAR | Status: DC | PRN
  Administered 2012-02-21: 100 mg via INTRAVENOUS

## 2012-02-21 MED ORDER — CEFAZOLIN SODIUM 1-5 GM-% IV SOLN
INTRAVENOUS | Status: AC
Start: 2012-02-21 — End: 2012-02-21
  Filled 2012-02-21: qty 100

## 2012-02-21 MED ORDER — CEFAZOLIN SODIUM 1-5 GM-% IV SOLN
1.0000 g | Freq: Three times a day (TID) | INTRAVENOUS | Status: DC
  Administered 2012-02-21: 1 g via INTRAVENOUS
  Filled 2012-02-21 (×2): qty 50

## 2012-02-21 MED ORDER — LIDOCAINE HCL (CARDIAC) 20 MG/ML IV SOLN
INTRAVENOUS | Status: DC | PRN
  Administered 2012-02-21: 100 mg via INTRAVENOUS

## 2012-02-21 MED ORDER — PHENYLEPHRINE HCL 10 MG/ML IJ SOLN
INTRAMUSCULAR | Status: DC | PRN
  Administered 2012-02-21 (×3): 80 ug via INTRAVENOUS

## 2012-02-21 MED ORDER — LACTATED RINGERS IV SOLN
INTRAVENOUS | Status: DC | PRN
  Administered 2012-02-21 (×3): via INTRAVENOUS

## 2012-02-21 MED ORDER — PANTOPRAZOLE SODIUM 40 MG IV SOLR
40.0000 mg | Freq: Every day | INTRAVENOUS | Status: DC
  Administered 2012-02-21: 40 mg via INTRAVENOUS
  Filled 2012-02-21 (×2): qty 40

## 2012-02-21 MED ORDER — IOHEXOL 350 MG/ML SOLN
INTRAVENOUS | Status: DC | PRN
  Administered 2012-02-21: 30 mL via INTRAVENOUS

## 2012-02-21 MED ORDER — ONDANSETRON HCL 4 MG/2ML IJ SOLN
INTRAMUSCULAR | Status: AC
  Administered 2012-02-21: 4 mg
  Filled 2012-02-21: qty 2

## 2012-02-21 MED ORDER — FENTANYL CITRATE 0.05 MG/ML IJ SOLN
INTRAMUSCULAR | Status: DC | PRN
  Administered 2012-02-21 (×2): 100 ug via INTRAVENOUS
  Administered 2012-02-21: 50 ug via INTRAVENOUS
  Administered 2012-02-21 (×5): 100 ug via INTRAVENOUS
  Administered 2012-02-21: 50 ug via INTRAVENOUS
  Administered 2012-02-21 (×2): 100 ug via INTRAVENOUS

## 2012-02-21 MED ORDER — PANTOPRAZOLE SODIUM 40 MG PO TBEC: 40.0000 mg | DELAYED_RELEASE_TABLET | Freq: Every day | ORAL | Status: DC

## 2012-02-21 MED ORDER — ONDANSETRON HCL 4 MG PO TABS
4.0000 mg | ORAL_TABLET | Freq: Four times a day (QID) | ORAL | Status: DC | PRN
  Filled 2012-02-21: qty 0.5

## 2012-02-21 MED ORDER — SODIUM CHLORIDE 0.9 % IV SOLN
2.0000 mg/h | INTRAVENOUS | Status: DC
  Administered 2012-02-21: 2 mg/h via INTRAVENOUS
  Administered 2012-02-22: 1 mg/h via INTRAVENOUS
  Filled 2012-02-21 (×3): qty 10

## 2012-02-21 MED ORDER — POTASSIUM CHLORIDE IN NACL 20-0.9 MEQ/L-% IV SOLN
INTRAVENOUS | Status: DC
  Administered 2012-02-21: 100 mL/h via INTRAVENOUS
  Administered 2012-02-22 – 2012-02-23 (×2): via INTRAVENOUS
  Administered 2012-02-24 (×2): 75 mL/h via INTRAVENOUS
  Administered 2012-02-25: 11:00:00 via INTRAVENOUS
  Administered 2012-02-26 (×2): 75 mL via INTRAVENOUS
  Administered 2012-02-26 (×2): via INTRAVENOUS
  Filled 2012-02-21 (×12): qty 1000

## 2012-02-21 MED ORDER — SODIUM CHLORIDE 0.9 % IV SOLN
50.0000 ug/h | INTRAVENOUS | Status: DC
  Administered 2012-02-21: 50 ug/h via INTRAVENOUS
  Administered 2012-02-23: 30 ug/h via INTRAVENOUS
  Administered 2012-02-25: 40 ug/h via INTRAVENOUS
  Administered 2012-02-26 – 2012-02-27 (×2): 200 ug/h via INTRAVENOUS
  Filled 2012-02-21 (×5): qty 50

## 2012-02-21 MED ORDER — SODIUM CHLORIDE 0.9 % IR SOLN
Status: DC | PRN
  Administered 2012-02-21: 9000 mL

## 2012-02-21 MED ORDER — MIDAZOLAM HCL 2 MG/2ML IJ SOLN
2.0000 mg | Freq: Once | INTRAMUSCULAR | Status: AC
Start: 2012-02-21 — End: 2012-02-21
  Administered 2012-02-21: 2 mg via INTRAVENOUS

## 2012-02-21 MED ORDER — ALBUMIN HUMAN 5 % IV SOLN
INTRAVENOUS | Status: DC | PRN
  Administered 2012-02-21 (×2): via INTRAVENOUS

## 2012-02-21 MED ORDER — SODIUM CHLORIDE 0.9 % IV SOLN
INTRAVENOUS | Status: DC | PRN
  Administered 2012-02-21: 15:00:00 via INTRAVENOUS

## 2012-02-21 SURGICAL SUPPLY — 114 items
10mm tibial nail/360mm (Nail) ×1 IMPLANT
500ML CANISTER ×1 IMPLANT
ADH SKN CLS LQ APL DERMABOND (GAUZE/BANDAGES/DRESSINGS) ×3
BANDAGE ELASTIC 4 VELCRO ST LF (GAUZE/BANDAGES/DRESSINGS) ×5 IMPLANT
BANDAGE ELASTIC 6 VELCRO ST LF (GAUZE/BANDAGES/DRESSINGS) ×5 IMPLANT
BANDAGE GAUZE ELAST BULKY 4 IN (GAUZE/BANDAGES/DRESSINGS) ×10 IMPLANT
BIOMET LAG SCREW GUIDEWIRE 3.2MM ×1 IMPLANT
BIT DRILL 2.5X110 QC LCP DISP (BIT) ×1 IMPLANT
BIT DRILL CAL 3.2 LONG (BIT) ×1 IMPLANT
BIT DRILL CALIBRATED 4.2 (BIT) IMPLANT
BIT DRILL CALIBRATED 4.3MMX365 (DRILL) IMPLANT
BIT DRILL CROWE PNT TWST 4.5MM (DRILL) IMPLANT
BLADE SURG 10 STRL SS (BLADE) ×7 IMPLANT
BLADE SURG 15 STRL LF DISP TIS (BLADE) IMPLANT
BLADE SURG 15 STRL SS (BLADE) ×4
BNDG COHESIVE 4X5 TAN STRL (GAUZE/BANDAGES/DRESSINGS) ×4 IMPLANT
BNDG GAUZE STRTCH 6 (GAUZE/BANDAGES/DRESSINGS) ×12 IMPLANT
BRUSH SCRUB DISP (MISCELLANEOUS) ×8 IMPLANT
CANISTER WOUND CARE 500ML ATS (WOUND CARE) ×2 IMPLANT
CLOTH BEACON ORANGE TIMEOUT ST (SAFETY) ×4 IMPLANT
COVER SURGICAL LIGHT HANDLE (MISCELLANEOUS) ×7 IMPLANT
DERMABOND ADHESIVE PROPEN (GAUZE/BANDAGES/DRESSINGS) ×1
DERMABOND ADVANCED .7 DNX6 (GAUZE/BANDAGES/DRESSINGS) IMPLANT
DRAPE C-ARM 42X72 X-RAY (DRAPES) ×4 IMPLANT
DRAPE C-ARMOR (DRAPES) ×7 IMPLANT
DRAPE EXTREMITY BILATERAL (DRAPE) ×1 IMPLANT
DRAPE INCISE IOBAN 66X45 STRL (DRAPES) ×4 IMPLANT
DRAPE ORTHO SPLIT 77X108 STRL (DRAPES) ×4
DRAPE PROXIMA HALF (DRAPES) ×4 IMPLANT
DRAPE SURG ORHT 6 SPLT 77X108 (DRAPES) ×6 IMPLANT
DRAPE U-SHAPE 47X51 STRL (DRAPES) ×6 IMPLANT
DRILL BIT CALIBRATED 4.2 (BIT) ×4
DRILL CALIBRATED 4.3MMX365 (DRILL) ×4
DRILL CROWE POINT TWIST 4.5MM (DRILL) ×4
DRSG ADAPTIC 3X8 NADH LF (GAUZE/BANDAGES/DRESSINGS) ×4 IMPLANT
DRSG MEPILEX BORDER 4X4 (GAUZE/BANDAGES/DRESSINGS) ×1 IMPLANT
DRSG MEPITEL 4X7.2 (GAUZE/BANDAGES/DRESSINGS) ×2 IMPLANT
DRSG PAD ABDOMINAL 8X10 ST (GAUZE/BANDAGES/DRESSINGS) ×1 IMPLANT
DRSG VAC ATS MED SENSATRAC (GAUZE/BANDAGES/DRESSINGS) ×1 IMPLANT
DRSG VAC ATS SM SENSATRAC (GAUZE/BANDAGES/DRESSINGS) ×1 IMPLANT
ELECT CAUTERY BLADE 6.4 (BLADE) ×1 IMPLANT
ELECT REM PT RETURN 9FT ADLT (ELECTROSURGICAL) ×4
ELECTRODE REM PT RTRN 9FT ADLT (ELECTROSURGICAL) ×3 IMPLANT
EVACUATOR 1/8 PVC DRAIN (DRAIN) IMPLANT
GLOVE BIO SURGEON STRL SZ7.5 (GLOVE) ×7 IMPLANT
GLOVE BIO SURGEON STRL SZ8 (GLOVE) ×6 IMPLANT
GLOVE BIO SURGEON STRL SZ8.5 (GLOVE) ×3 IMPLANT
GLOVE BIOGEL PI IND STRL 7.5 (GLOVE) ×3 IMPLANT
GLOVE BIOGEL PI IND STRL 8 (GLOVE) ×3 IMPLANT
GLOVE BIOGEL PI INDICATOR 7.5 (GLOVE) ×1
GLOVE BIOGEL PI INDICATOR 8 (GLOVE) ×1
GOWN PREVENTION PLUS XLARGE (GOWN DISPOSABLE) ×4 IMPLANT
GOWN STRL NON-REIN LRG LVL3 (GOWN DISPOSABLE) ×8 IMPLANT
GUIDEWIRE BEAD TIP (WIRE) ×1 IMPLANT
HANDPIECE INTERPULSE COAX TIP (DISPOSABLE) ×4
KIT BASIN OR (CUSTOM PROCEDURE TRAY) ×5 IMPLANT
KIT INFUSE LRG II (Orthopedic Implant) ×1 IMPLANT
KIT ROOM TURNOVER OR (KITS) ×4 IMPLANT
MANIFOLD NEPTUNE II (INSTRUMENTS) ×4 IMPLANT
NAIL FEM RETRO 10.5X400 (Nail) ×1 IMPLANT
NDL PERC 18GX7CM (NEEDLE) IMPLANT
NEEDLE PERC 18GX7CM (NEEDLE) ×4 IMPLANT
NS IRRIG 1000ML POUR BTL (IV SOLUTION) ×4 IMPLANT
PACK GENERAL/GYN (CUSTOM PROCEDURE TRAY) ×4 IMPLANT
PACK ORTHO EXTREMITY (CUSTOM PROCEDURE TRAY) ×3 IMPLANT
PACK PERIPHERAL VASCULAR (CUSTOM PROCEDURE TRAY) ×1 IMPLANT
PAD ARMBOARD 7.5X6 YLW CONV (MISCELLANEOUS) ×8 IMPLANT
PAD CAST 4YDX4 CTTN HI CHSV (CAST SUPPLIES) IMPLANT
PADDING CAST ABS 6INX4YD NS (CAST SUPPLIES) ×1
PADDING CAST ABS COTTON 6X4 NS (CAST SUPPLIES) IMPLANT
PADDING CAST COTTON 4X4 STRL (CAST SUPPLIES) ×8
PADDING CAST COTTON 6X4 STRL (CAST SUPPLIES) ×4 IMPLANT
PLATE LCP 3.5 1/3 TUB 5HX57 (Plate) ×1 IMPLANT
REAMER ROD DEEP FLUTE 2.5X950 (INSTRUMENTS) ×1 IMPLANT
SCREW CORT TI DBL LEAD 5X32 (Screw) ×1 IMPLANT
SCREW CORT TI DBL LEAD 5X34 (Screw) ×1 IMPLANT
SCREW CORT TI DBL LEAD 5X70 (Screw) ×2 IMPLANT
SCREW CORT TI DBL LEAD 5X75 (Screw) ×2 IMPLANT
SCREW CORTEX 3.5 10MM (Screw) ×3 IMPLANT
SCREW CORTEX 3.5 12MM (Screw) ×1 IMPLANT
SCREW DUAL CORE 5.0 75MM (Screw) ×1 IMPLANT
SCREW DUAL CORE LOCKING 5.0X70 (Screw) ×1 IMPLANT
SCREW LOCK CORT ST 3.5X10 (Screw) IMPLANT
SCREW LOCK CORT ST 3.5X12 (Screw) IMPLANT
SCREW LOCK STAR 5X30 (Screw) ×1 IMPLANT
SCREW LOCK STAR 5X32 (Screw) ×1 IMPLANT
SET HNDPC FAN SPRY TIP SCT (DISPOSABLE) IMPLANT
SPONGE GAUZE 4X4 12PLY (GAUZE/BANDAGES/DRESSINGS) ×5 IMPLANT
SPONGE LAP 18X18 X RAY DECT (DISPOSABLE) ×5 IMPLANT
STAPLER VISISTAT 35W (STAPLE) ×4 IMPLANT
STOCKINETTE IMPERVIOUS 9X36 MD (GAUZE/BANDAGES/DRESSINGS) ×4 IMPLANT
STOPCOCK 4 WAY LG BORE MALE ST (IV SETS) ×1 IMPLANT
STRIP CLOSURE SKIN 1/2X4 (GAUZE/BANDAGES/DRESSINGS) ×3 IMPLANT
SUT ETHILON 1 TP 1 60 (SUTURE) ×1 IMPLANT
SUT ETHILON 2 0 FS 18 (SUTURE) ×5 IMPLANT
SUT PDS AB 2-0 CT1 27 (SUTURE) ×1 IMPLANT
SUT PROLENE 3 0 PS 2 (SUTURE) IMPLANT
SUT VIC AB 0 CT1 27 (SUTURE) ×8
SUT VIC AB 0 CT1 27XBRD ANBCTR (SUTURE) IMPLANT
SUT VIC AB 2-0 CT1 27 (SUTURE) ×8
SUT VIC AB 2-0 CT1 TAPERPNT 27 (SUTURE) IMPLANT
SUT VIC AB 2-0 CT3 27 (SUTURE) IMPLANT
SUT VIC AB 3-0 SH 27 (SUTURE) ×4
SUT VIC AB 3-0 SH 27XBRD (SUTURE) IMPLANT
SUT VICRYL 4-0 PS2 18IN ABS (SUTURE) ×1 IMPLANT
TOWEL OR 17X24 6PK STRL BLUE (TOWEL DISPOSABLE) ×4 IMPLANT
TOWEL OR 17X26 10 PK STRL BLUE (TOWEL DISPOSABLE) ×9 IMPLANT
TRAY FOLEY CATH 14FR (SET/KITS/TRAYS/PACK) ×3 IMPLANT
TUBE ANAEROBIC SPECIMEN COL (MISCELLANEOUS) IMPLANT
TUBE CONNECTING 12X1/4 (SUCTIONS) ×3 IMPLANT
TUBING EXTENTION W/L.L. (IV SETS) ×1 IMPLANT
UNDERPAD 30X30 INCONTINENT (UNDERPADS AND DIAPERS) ×5 IMPLANT
WATER STERILE IRR 1000ML POUR (IV SOLUTION) ×4 IMPLANT
YANKAUER SUCT BULB TIP NO VENT (SUCTIONS) ×4 IMPLANT

## 2012-02-21 NOTE — Brief Op Note (Signed)
02/21/2012  6:41 PM  PATIENT:  Stephen Buckley  61 y.o. male  PRE-OPERATIVE DIAGNOSIS:   R open tibia, grade 3A L distal femoral shaft fracture Pulseless right leg below knee  POST-OPERATIVE DIAGNOSIS:   R open tibia, grade 3A L distal femoral shaft fracture Multifactorial reduction in vascular flow right leg, restored  PROCEDURE:  Procedure(s) (LRB): INTRAMEDULLARY (IM) NAIL TIBIAL (Right) with Synthes EX 10mm x INTRAMEDULLARY (IM) RETROGRADE FEMORAL NAILING (Left) Biomet [place] 10.62mm x IRRIGATION AND DEBRIDEMENT EXTREMITY (Right) tibia including bone APPLICATION OF WOUND VAC (Right) Large 4 compartment fasciotomy  SURGEON:  Surgeon(s) and Role:    * Budd Palmer, MD - Primary  PHYSICIAN ASSISTANT: [name], [facility]  ANESTHESIA:   general  EBL:  Total I/O In: 2200 [I.V.:1700; IV Piggyback:500] Out: 1350 [Urine:1000; Blood:350]  DRAINS: none  LOCAL MEDICATIONS USED:  NONE  SPECIMEN:  No Specimen  DISPOSITION OF SPECIMEN:  N/A  COUNTS:  YES  TOURNIQUET:  * No tourniquets in log *  DICTATION: .Other Dictation: Dictation Number [id number] and [id number]  PLAN OF CARE: Admit to inpatient   PATIENT DISPOSITION:  ICU, hemodynamically stable   Delay start of Pharmacological VTE agent (>24hrs) due to surgical blood loss or risk of bleeding: no

## 2012-02-21 NOTE — Preoperative (Signed)
Beta Blockers   Reason not to administer Beta Blockers:Not Applicable 

## 2012-02-21 NOTE — Consult Note (Signed)
I have seen and examined the patient. I agree with the findings above. The risks and benefits of surgery, including the possibility of infection, nerve injury, vessel injury, wound breakdown, arthritis, symptomatic hardware, DVT/ PE, loss of motion, and need for further surgery among others were discussed.  After this discussion, patient wished to proceed.  Budd Palmer, MD 02/21/2012 1:18 PM

## 2012-02-21 NOTE — Procedures (Signed)
FAST  Four quadrant ultrasound performed in the trauma resuscitation room.  No evidence of intra-abdominal blood.  CT scan pending.  Marta Lamas. Gae Bon, MD, FACS [phone] Trauma Surgeon
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
## 2012-02-21 NOTE — Progress Notes (Signed)
Orthopedic Tech Progress Note Patient Details:  Stephen Buckley 1950/09/19 [id number]  Ortho Devices Type of Ortho Device: Knee Immobilizer;Long leg splint Ortho Device/Splint Location: left long leg splint , right knee immobilizer Ortho Device/Splint Interventions: Application   Cammer, Mickie Bail 02/21/2012, 10:45 AM
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
## 2012-02-21 NOTE — ED Provider Notes (Signed)
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 interpretation on 02/21/2012 at 08:10 a.m. to Dr. Lindie Spruce, who verbally acknowledged these results.   Original Report Authenticated By: Jamesetta Orleans. MATTERN, M.D.      1. Subdural hematoma   2. Femur fracture, left       MDM  Airway stable. Will turn over to trauma team for workup.         Loren Racer, MD 02/22/12 [phone]

## 2012-02-21 NOTE — Progress Notes (Signed)
Chaplain responded to trauma page.  No family present as of this time. Patient sleeping.  No needs identified by RN. Please page [phone] if needed.  Rev. [place], [place] [phone]
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
## 2012-02-21 NOTE — OR Nursing (Signed)
1st call @[phone]  to [phone]

## 2012-02-21 NOTE — Anesthesia Procedure Notes (Signed)
Procedure Name: Intubation Date/Time: 02/21/2012 2:05 PM Performed by: Glendora Score A Pre-anesthesia Checklist: Patient identified, Emergency Drugs available, Suction available and Patient being monitored Patient Re-evaluated:Patient Re-evaluated prior to inductionOxygen Delivery Method: Circle system utilized Preoxygenation: Pre-oxygenation with 100% oxygen Intubation Type: IV induction and Rapid sequence Tube size: 7.5 mm Number of attempts: 1 Airway Equipment and Method: Stylet and Video-laryngoscopy Placement Confirmation: ETT inserted through vocal cords under direct vision,  positive ETCO2 and breath sounds checked- equal and bilateral Secured at: 23 cm Tube secured with: Tape Dental Injury: Teeth and Oropharynx as per pre-operative assessment
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
## 2012-02-21 NOTE — Progress Notes (Signed)
NAME:  Stephen Buckley, Stephen Buckley  MR#:  [id number]      DATE:  02/14/2012  DOB:  [birth year]    cc: Primary Care Physician: Jaclyn Shaggy, MD, [address] [place], [place] [address], Fax [phone]  Referring Physician: Dow Adolph, MD, [facility], [address], [place], [place] [address], Fax [phone]    REASON FOR VISIT:  Follow up of genotype 1 hepatitis C.   HISTORY:  The patient returns accompanied by his girlfriend. He is a 61 year old gentleman with history genotype 1 hepatitis C, subtype unknown, IL28B TT with a liver biopsy on 04/09/2005, showing grade 3 stage zero disease without significant iron accumulation. He has remained naive to treatment. While followed by me previously, I would not consider him eligible for treatment because of his significant alcohol use. When I saw him in followup on 03/15/2011, he had been abstinent for 7 months. I arranged for a liver biopsy to restage his disease. This was done on 04/03/2011, and showed of MHAI of 3/18 with a fibrosis score 1/6. This corresponds on the Ludwig and Batts scale roughly as grade 1 inflammation/stage I fibrosis. He has not been back since that time.   Currently he has no symptoms referable to his history of hepatitis C. There are no symptoms to suggest cryoglobulin mediated or decompensated liver disease. Although he complains of intermittent right flank pain, this is not a symptom of hepatitis C and beyond the scope of my practice. I have noted, however, that he has lost 18 pounds since last being seen. When seen on 12/04/2011, his weight was 64.9 kilograms compared to today's weight of 63.2 kilograms so virtually unchanged since June.   PAST MEDICAL HISTORY:  There has been no other interval changes.   CURRENT MEDICATIONS:  1. Baclofen 10 mg daily p.r.n.  2. Tapazole 40 mg daily.  3. Vitamin D 50,000 units weekly.   ALLERGIES:  Denies.   HABITS:  Smoking half pack of  cigarettes per day. Alcohol, he has not had any alcohol in over 18 months.   REVIEW OF SYSTEMS:  All 10 systems reviewed today with the patient. The only other complaint that he has is that of urinary incontinence at times, but otherwise all 10 systems reviewed today with the patient and they are negative other than which was mentioned above.   PHYSICAL EXAMINATION:  Constitutional: Well appearing without stigmata of chronic liver disease. Vital signs: Height 69 inches, weight 139 pounds or 63.2 kilograms. Blood pressure 102/74, pulse 72, temperature 97.8 Fahrenheit. Ears, nose, mouth, and throat: Poor dentition. Otherwise, unremarkable oropharynx with no thyromegaly or neck masses. Chest: resonant to percussion. Clear to auscultation. Cardiovascular: Heart sounds normal S1, S2 without murmurs or rubs. There is no peripheral edema. Abdominal exam: Normal bowel sounds. No masses or tenderness. I could not appreciate liver edge or spleen tip. Lymphatics: No cervical or inguinal lymphadenopathy. Central nervous system: No asterixis or focal neurologic findings. Dermatologic: Anicteric. There was no palmar erythema. Eyes: Anicteric sclera. Pupils equal and reactive to light.   LABORATORIES:  Done on 12/04/2011, [facility], which was not sent with the records sent and is not accessible through [facility]. It is my understanding [facility] is now closed, so it may not be possible to get these lab results.   There has been no further imaging liver since ultrasound-guided biopsy on 04/03/2011.   ASSESSMENT:  The patient is a 61 year old gentleman with history of genotype 1, subtype unknown, IL28B TT  genotype, with biopsy 06/30/2004, showing grade 3 stage zero disease and a biopsy on 04/03/2011, showing grade 2 stage I fibrosis at the very most. He remains naive to treatment. His abstinence from alcohol is commendable because this previously represented a contraindication to treating him. It would be worth his  while to wait for the availability of sofosbuvir in combination with peginterferon, and ribavirin, which is expected after December 2012.   In my discussion today with the patient, we discussed the significance of his genotype and his biopsy findings. I explained he could certainly afford to wait for the availability of sofosbuvir. I also told him that his other complaints are not directly referable to his liver disease, and so therefore he should seek care through his primary physician for the urinary incontinence.   PLAN:  1. I did not draw any labs today.  2. I will call [facility] to see if there is any way of getting those labs drawn from June faxed to [place].  3. Previously reported to be hepatitis A and B vaccinated in the past.  4. He will be followed up in early 2014, at which time I expect that sofosbuvir will be on the market to be used.               Brooke Dare, MD   ADDENDUM Labs from 12/17/11 AST 56  ALT 57  ALP 86  T bili 0.7 Alb 3.9  creat 0.82  [phone]  D:  Thu Aug 22 16:52:35 2013 ; T:  Thu Aug 22 19:45:14 2013  Job #:  [id number]

## 2012-02-21 NOTE — Transfer of Care (Signed)
Immediate Anesthesia Transfer of Care Note  Patient: Stephen Buckley  Procedure(s) Performed: Procedure(s) (LRB): INTRAMEDULLARY (IM) NAIL TIBIAL (Right) IRRIGATION AND DEBRIDEMENT EXTREMITY (Right) INTRAMEDULLARY (IM) RETROGRADE FEMORAL NAILING (Left) APPLICATION OF WOUND VAC (Right) FASCIOTOMY (Right) LOWER EXTREMITY ANGIOGRAM (Right) FEMORAL ARTERY EXPLORATION (Right)  Patient Location: NICU  Anesthesia Type: General  Level of Consciousness: sedated and unresponsive  Airway & Oxygen Therapy: Patient remains intubated per anesthesia plan  Post-op Assessment: Report given to PACU RN, Post -op Vital signs reviewed and stable and Patient moving all extremities  Post vital signs: Reviewed and stable  Complications: No apparent anesthesia complications
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
## 2012-02-21 NOTE — ED Notes (Signed)
Patient with family at bedside at this time, patient arousable to voice, patient in NAD at this time

## 2012-02-21 NOTE — ED Notes (Signed)
ortho tech completed applying long leg splint on left leg and knee immobilizer on right leg

## 2012-02-21 NOTE — ED Notes (Signed)
Patient in NAD at this time, vitals WNL, patient resting with eyes closed , easily arousable

## 2012-02-21 NOTE — Anesthesia Postprocedure Evaluation (Addendum)
  Anesthesia Post-op Note  Patient: Stephen Buckley  Procedure(s) Performed: Procedure(s) (LRB): INTRAMEDULLARY (IM) NAIL TIBIAL (Right) IRRIGATION AND DEBRIDEMENT EXTREMITY (Right) INTRAMEDULLARY (IM) RETROGRADE FEMORAL NAILING (Left) APPLICATION OF WOUND VAC (Right) FASCIOTOMY (Right) LOWER EXTREMITY ANGIOGRAM (Right) FEMORAL ARTERY EXPLORATION (Right)  Patient Location: PACU  Anesthesia Type: General  Level of Consciousness: sedated and Patient remains intubated per anesthesia plan  Airway and Oxygen Therapy: Patient remains intubated per anesthesia plan and Patient placed on Ventilator (see vital sign flow sheet for setting)  Post-op Pain: none  Post-op Assessment: Post-op Vital signs reviewed, Patient's Cardiovascular Status Stable, Respiratory Function Stable, Patent Airway, No signs of Nausea or vomiting and Pain level controlled  Post-op Vital Signs: stable  Complications: No apparent anesthesia complications

## 2012-02-22 ENCOUNTER — Inpatient Hospital Stay (HOSPITAL_COMMUNITY): Payer: Medicaid Other

## 2012-02-22 ENCOUNTER — Encounter (HOSPITAL_COMMUNITY): Payer: Self-pay | Admitting: Orthopedic Surgery

## 2012-02-22 DIAGNOSIS — S82251C Displaced comminuted fracture of shaft of right tibia, initial encounter for open fracture type IIIA, IIIB, or IIIC: Secondary | ICD-10-CM

## 2012-02-22 DIAGNOSIS — S72402A Unspecified fracture of lower end of left femur, initial encounter for closed fracture: Secondary | ICD-10-CM | POA: Diagnosis present

## 2012-02-22 HISTORY — DX: Unspecified fracture of lower end of left femur, initial encounter for closed fracture: S72.402A

## 2012-02-22 HISTORY — DX: Displaced comminuted fracture of shaft of right tibia, initial encounter for open fracture type IIIA, IIIB, or IIIC: S82.251C

## 2012-02-22 LAB — BASIC METABOLIC PANEL
BUN: 13 mg/dL (ref 6–23)
CO2: 24 mEq/L (ref 19–32)
Chloride: 109 mEq/L (ref 96–112)
Creatinine, Ser: 0.97 mg/dL (ref 0.50–1.35)
GFR calc Af Amer: >90 mL/min (ref >90)
Glucose, Bld: 155 mg/dL — ABNORMAL HIGH (ref 70–99)
Potassium: 4.5 mEq/L (ref 3.5–5.1)

## 2012-02-22 LAB — CBC
HCT: 20.3 % — ABNORMAL LOW (ref 39.0–52.0)
Hemoglobin: 6.9 g/dL — CL (ref 13.0–17.0)
MCV: 90.2 fL (ref 78.0–100.0)
RDW: 14.1 % (ref 11.5–15.5)
WBC: 15.9 10*3/uL — ABNORMAL HIGH (ref 4.0–10.5)

## 2012-02-22 LAB — BLOOD GAS, ARTERIAL
Acid-base deficit: 1.5 mmol/L (ref 0.0–2.0)
Drawn by: 311371
FIO2: 0.4 %
MECHVT: 650 mL
RATE: 12 resp/min
pCO2 arterial: 37.9 mmHg (ref 35.0–45.0)
pH, Arterial: 7.395 (ref 7.350–7.450)

## 2012-02-22 LAB — HEMOGLOBIN AND HEMATOCRIT, BLOOD: HCT: 25 % — ABNORMAL LOW (ref 39.0–52.0)

## 2012-02-22 MED ORDER — PANTOPRAZOLE SODIUM 40 MG PO PACK
40.0000 mg | PACK | Freq: Every day | ORAL | Status: DC
  Administered 2012-02-22 – 2012-02-27 (×6): 40 mg
  Filled 2012-02-22 (×8): qty 20

## 2012-02-22 MED ORDER — SODIUM CHLORIDE 0.9 % IJ SOLN: 10.0000 mL | INTRAMUSCULAR | Status: DC | PRN

## 2012-02-22 MED ORDER — CHLORHEXIDINE GLUCONATE 0.12 % MT SOLN
15.0000 mL | Freq: Two times a day (BID) | OROMUCOSAL | Status: DC
  Administered 2012-02-22 – 2012-03-03 (×19): 15 mL via OROMUCOSAL
  Filled 2012-02-22 (×23): qty 15

## 2012-02-22 MED ORDER — BIOTENE DRY MOUTH MT LIQD
15.0000 mL | Freq: Four times a day (QID) | OROMUCOSAL | Status: DC
  Administered 2012-02-22 – 2012-03-03 (×32): 15 mL via OROMUCOSAL

## 2012-02-22 MED ORDER — SODIUM CHLORIDE 0.9 % IJ SOLN
10.0000 mL | Freq: Two times a day (BID) | INTRAMUSCULAR | Status: DC
  Administered 2012-02-22: 30 mL
  Administered 2012-02-23 – 2012-02-24 (×4): 10 mL
  Administered 2012-02-25: 20 mL
  Administered 2012-02-25: 10 mL
  Administered 2012-02-26: 20 mL
  Administered 2012-02-26 – 2012-03-02 (×8): 10 mL
  Administered 2012-03-03: 30 mL

## 2012-02-22 MED ORDER — PIVOT 1.5 CAL PO LIQD
1000.0000 mL | ORAL | Status: DC
  Filled 2012-02-22 (×2): qty 1000

## 2012-02-22 MED ORDER — ADULT MULTIVITAMIN LIQUID CH
5.0000 mL | Freq: Every day | ORAL | Status: DC
  Administered 2012-02-22 – 2012-02-27 (×6): 5 mL
  Filled 2012-02-22 (×8): qty 5

## 2012-02-22 MED ORDER — PIVOT 1.5 CAL PO LIQD
1000.0000 mL | ORAL | Status: DC
  Administered 2012-02-22 – 2012-02-26 (×4): 1000 mL
  Filled 2012-02-22 (×8): qty 1000

## 2012-02-22 MED ORDER — DOCUSATE SODIUM 50 MG/5ML PO LIQD
100.0000 mg | Freq: Every day | ORAL | Status: DC
  Administered 2012-02-22 – 2012-02-27 (×6): 100 mg via ORAL
  Filled 2012-02-22 (×8): qty 10

## 2012-02-22 MED ORDER — PIVOT 1.5 CAL PO LIQD
1000.0000 mL | ORAL | Status: DC
  Filled 2012-02-22: qty 1000

## 2012-02-22 MED ORDER — PRO-STAT SUGAR FREE PO LIQD
30.0000 mL | Freq: Two times a day (BID) | ORAL | Status: DC
  Administered 2012-02-22 – 2012-02-27 (×11): 30 mL
  Filled 2012-02-22 (×12): qty 30
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
## 2012-02-22 NOTE — Op Note (Signed)
NAMEMarland Buckley  GEAROLD, WAINER NO.:  [id number]  MEDICAL RECORD NO.:  [id number]  LOCATION:  3104                         FACILITY:  [facility]  PHYSICIAN:  Doralee Albino. Carola Frost, M.D. DATE OF BIRTH:  10-16-1950  DATE OF PROCEDURE: DATE OF DISCHARGE:                              OPERATIVE REPORT   ADDENDUM:  [name], PA-C assisted me throughout that procedure and was absolutely necessary for assistance with control of the fracture. Also with placement of both provisional and definitive fixation and reaming.  It should be noted that prior to closure, we did remove the one-third tubular plate and applied a large Infuse BMP sponge.  Attention was then turned to the left leg where a closed reduction maneuver was performed after placing appropriate bumps and using the radiolucent triangle to reduce the fracture.  A 2-cm incision was made medial to the patellar tendon with use of the guidepin for the correct starting point and trajectory, sequential reaming up to 12 and placement of a 10.5-mm Phoenix Biomet nail, 380 mm in length.  All distal locks were placed and a telescoping device used to convert these locking bolts to fixed angle.  Proximally, two screws were placed using perfect circle technique.  Final images showed appropriate reduction, hardware, trajectory, and length.  [name] pulled traction and maintain the reduction throughout instrumentation and his assistance was necessary for the case.  Wounds were irrigated and closed in standard layered fashion.  During application of the dressings, re-evaluation was performed of the extremities, palpating a 1 to 2+ pulse on the left and not being able to palpate any pulses on the right, which was a variance from preop.  The foot likewise was cold.  Compartments remained soft in the leg.  After warming, I again repeated the vascular examination, finding an excellent femoral pulse without a palpable or discernible  popliteal pulse, no discernible dorsalis pedis or posterior tibial pulses either by Doppler or palpation.  I contacted the Vascular Service and Dr. Edilia Bo who was on-call, discussed the case with him and he came in and we proceeded with a 4-compartment fasciotomy, which I performed.  These were through extensile incisions making certain I was in both the anterior and posterior compartment clearly identify superficial peroneal nerve.  The muscle tissue did not appear to be under significant duress and the muscle was pink and contractile.  This was true on the anterior and lateral compartments as well as the superficial, deep and deep posterior, which was entered after takedown of the soleus.  [name] assisted me with these as well and then we applied wound VACs to both the medial and lateral fasciotomy wounds to help with control of swelling and delayed closure.  Dr. Edilia Bo performed an arteriogram, which did show patent vasculature and adequate anterior tib flow despite some generalized vessel disease and another irregularities that were noted on his separate dictation.  At that point, we then proceeded with application of noncompressive dressings and transport to the ICU in stable condition, but intubated.  PROGNOSIS:  Continue to follow Mr. Delprado who will remain on the Trauma Service with Dr. Edilia Bo consulting from Vascular.  Because of his head injury with intracerebral hemorrhage, he was  a poor candidate for revascularization or other similar procedure.  He remains at high risk for infection and skin breakdown given the significant trauma about the knee, but we were hopeful this is mitigated by intramedullary fixation.     Doralee Albino. Carola Frost, M.D.     MHH/MEDQ  D:  02/21/2012  T:  02/22/2012  Job:  [id number]

## 2012-02-22 NOTE — Progress Notes (Signed)
Neurosurgeon notified of CT scan results. Recommends not weaning patient for 5 days. Trauma MD notified. Will continue to assess.
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
## 2012-02-22 NOTE — Progress Notes (Signed)
UR completed 

## 2012-02-22 NOTE — Progress Notes (Signed)
VASCULAR PROGRESS NOTE  SUBJECTIVE: Sedated  PHYSICAL EXAM: Filed Vitals:   02/22/12 0900 02/22/12 0905 02/22/12 0915 02/22/12 0930  BP: 105/71 105/71 107/62 103/89  Pulse: 100 99 97 112  Temp:  98.8 F (37.1 C)    TempSrc:  Oral    Resp: 12 12 12 13   Height:      Weight:      SpO2: 100%      Doppler right DP and PT - brisk  LABS: Lab Results  Component Value Date   WBC 15.9* 02/22/2012   HGB 6.9* 02/22/2012   HCT 20.3* 02/22/2012   MCV 90.2 02/22/2012   PLT 119* 02/22/2012   ASSESSMENT/PLAN: 1. Vascular will be available as needed.  Waverly Ferrari, MD, FACS Beeper: [phone] 02/22/2012

## 2012-02-22 NOTE — Progress Notes (Signed)
Peripherally Inserted Central Catheter/Midline Placement  The IV Nurse has discussed with the patient and/or persons authorized to consent for the patient, the purpose of this procedure and the potential benefits and risks involved with this procedure.  The benefits include less needle sticks, lab draws from the catheter and patient may be discharged home with the catheter.  Risks include, but not limited to, infection, bleeding, blood clot (thrombus formation), and puncture of an artery; nerve damage and irregular heat beat.  Alternatives to this procedure were also discussed.  PICC/Midline Placement Documentation  PICC Triple Lumen 02/22/12 PICC Right Basilic (Active)       Sybella Harnish, Lajean Manes 02/22/2012, 10:38 AM

## 2012-02-22 NOTE — Progress Notes (Addendum)
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 Extremities: palp DPs BLE, ortho dressings Neuro: on sedation but F/C, moved BLE and LLE with wake-up this AM Pupils L 3mm R 2mm slug - girlfrien says L pupil larger chronically Results for orders placed during the hospital encounter of 02/21/12 (from the past 24 hour(s))  URINE RAPID DRUG SCREEN (HOSP PERFORMED)     Status: Abnormal   Collection Time   02/21/12  9:12 AM      Component Value Range   Opiates NONE DETECTED  NONE DETECTED   Cocaine NONE DETECTED  NONE DETECTED   Benzodiazepines POSITIVE (*) NONE DETECTED  Amphetamines NONE DETECTED  NONE DETECTED   Tetrahydrocannabinol POSITIVE (*) NONE DETECTED   Barbiturates NONE DETECTED  NONE DETECTED  MRSA PCR SCREENING     Status: Normal   Collection Time   02/21/12  9:35 PM      Component Value Range   MRSA by PCR NEGATIVE  NEGATIVE  BLOOD GAS, ARTERIAL     Status: Abnormal   Collection Time   02/22/12 12:28 AM      Component Value Range   FIO2 0.40     Delivery systems VENTILATOR     Mode PRESSURE REGULATED VOLUME CONTROL     VT 650     Rate 12     Peep/cpap 5.0     pH, Arterial 7.395  7.350 - 7.450   pCO2 arterial 37.9  35.0 - 45.0 mmHg   pO2, Arterial 168.0 (*) 80.0 - 100.0 mmHg   Bicarbonate 22.7  20.0 - 24.0 mEq/L   TCO2 23.8  0 - 100 mmol/L   Acid-base deficit 1.5  0.0 -  2.0 mmol/L   O2 Saturation 100.0     Patient temperature 98.6     Collection site RIGHT RADIAL     Drawn by [id number]     Sample type ARTERIAL DRAW     Allens test (pass/fail) PASS  PASS  CBC     Status: Abnormal   Collection Time   02/22/12  5:07 AM      Component Value Range   WBC 15.9 (*) 4.0 - 10.5 K/uL   RBC 2.25 (*) 4.22 - 5.81 MIL/uL   Hemoglobin 6.9 (*) 13.0 - 17.0 g/dL   HCT 11.9 (*) 14.7 - 82.9 %   MCV 90.2  78.0 - 100.0 fL   MCH 30.7  26.0 - 34.0 pg   MCHC 34.0  30.0 - 36.0 g/dL   RDW 56.2  13.0 - 86.5 %   Platelets 119 (*) 150 - 400 K/uL  BASIC METABOLIC PANEL     Status: Abnormal   Collection Time   02/22/12  5:07 AM      Component Value Range   Sodium 141  135 - 145 mEq/L   Potassium 4.5  3.5 - 5.1 mEq/L   Chloride 109  96 - 112 mEq/L   CO2 24  19 - 32 mEq/L   Glucose, Bld 155 (*) 70 - 99 mg/dL   BUN 13  6 - 23 mg/dL   Creatinine, Ser 7.84  0.50 - 1.35 mg/dL   Calcium 7.6 (*) 8.4 - 10.5 mg/dL   GFR calc non Af Amer 89 (*) >90 mL/min   GFR calc Af Amer >90  >90 mL/min  PREPARE RBC (CROSSMATCH)     Status: Normal   Collection Time   02/22/12  6:32 AM      Component Value Range   Order Confirmation ORDER PROCESSED BY BLOOD BANK      Assessment & Plan: Present on Admission:  **None**   LOS: 1 day   Additional comments:I reviewed the patient's new clinical lab test results. and CXR PHBC TBI/R SDH, R temporal ICC, R SAH - F/U CT H per Dr. Gerlene Fee today Nasal, B orbit, and L max sinus Fxs - per Dr. Annalee Genta non-operative management VDRF - will try to begin weaning, good gas exchange and appropriate PCO2 ABL anemia - transfuse 2U PRBC ID - Ancef open Fx Open R tib fib Fx, L distal femur Fx - S/P ORIF by Dr. Carola Frost FEN - start TF VTE -  PAS (TBI) I spoke to the patient's girlfriend at the bedside Critical Care Total Time*: 6 Minutes  Violeta Gelinas, MD, MPH, FACS Pager: [phone]  02/22/2012  *Care during the described time interval was provided by me  and/or other providers on the critical care team.  I have reviewed this patient's available data, including medical history, events of note, physical examination and test results as part of my evaluation.

## 2012-02-22 NOTE — Progress Notes (Addendum)
Clinical Social Work  CSW attempted to meet with patient but patient out of room for procedure. No family present. CSW attempted to submit pasarr but needs patient social security number. CSW saved FL2 in [facility] in case SNF placement is needed. CSW needs patient or family permission before faxing out for bed offer. CSW will continue to follow.  [place], [place] [phone] (Coverage for Macario Golds)

## 2012-02-22 NOTE — Progress Notes (Signed)
I have seen and examined the patient. I agree with the findings above. Return to OR on Tue for fasciotomy closure on the right  Arren Laminack H, MD 02/22/2012 12:39 PM
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
## 2012-02-23 ENCOUNTER — Inpatient Hospital Stay (HOSPITAL_COMMUNITY): Payer: Medicaid Other

## 2012-02-23 LAB — TYPE AND SCREEN
ABO/RH(D): B POS
Antibody Screen: NEGATIVE
Unit division: 0
Unit division: 0

## 2012-02-23 LAB — BLOOD GAS, ARTERIAL
Acid-Base Excess: 1.6 mmol/L (ref 0.0–2.0)
Bicarbonate: 25.1 mEq/L — ABNORMAL HIGH (ref 20.0–24.0)
FIO2: 0.3 %
MECHVT: 650 mL
O2 Saturation: 99.5 %
PEEP: 5 cmH2O
Patient temperature: 98.6
RATE: 12 resp/min
TCO2: 26.2 mmol/L (ref 0–100)
pCO2 arterial: 36.2 mmHg (ref 35.0–45.0)
pH, Arterial: 7.455 — ABNORMAL HIGH (ref 7.350–7.450)
pO2, Arterial: 101 mmHg — ABNORMAL HIGH (ref 80.0–100.0)

## 2012-02-23 LAB — BASIC METABOLIC PANEL
Calcium: 7.4 mg/dL — ABNORMAL LOW (ref 8.4–10.5)
GFR calc Af Amer: >90 mL/min (ref >90)
GFR calc non Af Amer: >90 mL/min (ref >90)
Glucose, Bld: 143 mg/dL — ABNORMAL HIGH (ref 70–99)
Potassium: 3.8 mEq/L (ref 3.5–5.1)
Sodium: 142 mEq/L (ref 135–145)

## 2012-02-23 LAB — CBC
Hemoglobin: 7.6 g/dL — ABNORMAL LOW (ref 13.0–17.0)
MCH: 30.8 pg (ref 26.0–34.0)
MCHC: 34.9 g/dL (ref 30.0–36.0)
RDW: 14 % (ref 11.5–15.5)

## 2012-02-23 LAB — GLUCOSE, CAPILLARY: Glucose-Capillary: 121 mg/dL — ABNORMAL HIGH (ref 70–99)

## 2012-02-23 MED ORDER — INSULIN ASPART 100 UNIT/ML ~~LOC~~ SOLN
0.0000 [IU] | SUBCUTANEOUS | Status: DC
  Administered 2012-02-23 – 2012-02-28 (×19): 2 [IU] via SUBCUTANEOUS
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
## 2012-02-23 NOTE — Progress Notes (Signed)
Patient ID: Stephen Buckley, male   DOB: 10/20/1950, 61 y.o.   MRN: [id number] Follow up - Trauma and Critical Care  Patient Details:    Stephen Buckley is an 61 y.o. male.  Lines/tubes : Airway 7.5 mm (Active)  Secured at (cm) 23 cm 02/23/2012  7:41 AM  Measured From Lips 02/23/2012  7:41 AM  Secured Location Right 02/23/2012  7:41 AM  Secured By [employer] 02/23/2012  7:41 AM  Tube Holder Repositioned Yes 02/23/2012  7:41 AM  Cuff Pressure (cm H2O) 24 cm H2O 02/22/2012  8:36 PM  Site Condition Dry 02/22/2012  8:00 PM     PICC Triple Lumen 02/22/12 PICC Right Basilic (Active)  Indication for Insertion or Continuance of Line Limited venous access - need for IV therapy >5 days (PICC only) 02/22/2012 10:00 PM  Length mark (cm) 2 cm 02/22/2012 10:38 AM  Site Assessment Clean;Dry;Intact 02/22/2012 10:00 PM  Lumen #1 Status Flushed;Saline locked 02/22/2012 10:00 PM  Lumen #2 Status Flushed;Saline locked 02/22/2012 10:00 PM  Lumen #3 Status Infusing 02/22/2012 10:00 PM  Dressing Type Transparent 02/22/2012 10:00 PM  Dressing Status Clean;Dry;Intact;Antimicrobial disc in place 02/22/2012 10:00 PM  Line Care Connections checked and tightened;Line pulled back 02/22/2012 10:00 PM  Dressing Intervention Dressing reinforced 02/22/2012 10:00 PM  Dressing Change Due 02/27/12 02/22/2012 10:00 PM     Negative Pressure Wound Therapy Leg Right (Active)  Site / Wound Assessment Other (Comment) 02/22/2012  8:00 PM  Cycle Continuous 02/22/2012  8:00 PM  Target Pressure (mmHg) 125 02/22/2012  8:00 PM  Canister Changed No 02/22/2012  8:00 PM  Dressing Status Intact 02/22/2012  8:00 PM  Drainage Amount Other (Comment) 02/22/2012  8:00 PM  Drainage Description Serosanguineous 02/22/2012  8:00 PM  Output (mL) 350 mL 02/23/2012  5:51 AM     NG/OG Tube Orogastric 16 Fr. Center mouth (Active)  Placement Verification Auscultation 02/22/2012  8:00 PM  Site Assessment Clean;Dry;Intact 02/22/2012  8:00 PM  Status  Infusing tube feed 02/22/2012  8:00 PM  Drainage Appearance Tan 02/22/2012  8:00 PM  Gastric Residual 15 mL 02/23/2012 12:00 AM  Intake (mL) 40 mL 02/23/2012  8:00 AM     Urethral Catheter Straight-tip 14 Fr. (Active)  Indication for Insertion or Continuance of Catheter Prolonged immobilization 02/23/2012  8:00 AM  Site Assessment Clean;Intact;Dry 02/23/2012  8:00 AM  Collection Container Standard drainage bag 02/23/2012  8:00 AM  Securement Method Leg strap 02/23/2012  8:00 AM  Urinary Catheter Interventions Unclamped 02/23/2012  8:00 AM  Output (mL) 30 mL 02/22/2012  6:00 AM    Microbiology/Sepsis markers: Results for orders placed during the hospital encounter of 02/21/12  MRSA PCR SCREENING     Status: Normal   Collection Time   02/21/12  9:35 PM      Component Value Range Status Comment   MRSA by PCR NEGATIVE  NEGATIVE Final     Anti-infectives:  Anti-infectives     Start     Dose/Rate Route Frequency Ordered Stop   02/22/12 0400   ceFAZolin (ANCEF) IVPB 1 g/50 mL premix        1 g 100 mL/hr over 30 Minutes Intravenous Every 8 hours 02/21/12 2143     02/21/12 0800   ceFAZolin (ANCEF) IVPB 1 g/50 mL premix  Status:  Discontinued        1 g 100 mL/hr over 30 Minutes Intravenous 3 times per day 02/21/12 0758 02/21/12 2145   02/21/12 0730   ceFAZolin (ANCEF) IVPB  1 g/50 mL premix        1 g 100 mL/hr over 30 Minutes Intravenous  Once 02/21/12 1610 02/21/12 0800             Consults: Treatment Team:  Budd Palmer, MD Reinaldo Meeker, MD    Events:  Subjective:    Overnight Issues: Remains stable on vent  Objective:  Vital signs for last 24 hours: Temp:  [98.9 F (37.2 C)-100 F (37.8 C)] 99.1 F (37.3 C) (08/31 0700) Pulse Rate:  [80-114] 97  (08/31 0800) Resp:  [12-16] 15  (08/31 0800) BP: (94-133)/(52-96) 104/58 mmHg (08/31 0800) SpO2:  [99 %-100 %] 100 % (08/31 0800) FiO2 (%):  [30 %-40 %] 30 % (08/31 0741) Weight:  [154 lb 12.2 oz (70.2 kg)] 154 lb  12.2 oz (70.2 kg) (08/31 0600)  Hemodynamic parameters for last 24 hours:    Intake/Output from previous day: 08/30 0701 - 08/31 0700 In: 2677.7 [I.V.:1407.7; Blood:700; NG/GT:370; IV Piggyback:200] Out: 9604 [VWUJW:1191; Drains:350]  Intake/Output this shift: Total I/O In: 122 [I.V.:82; NG/GT:40] Out: 150 [Urine:150]  Vent settings for last 24 hours: Vent Mode:  [-] PRVC FiO2 (%):  [30 %-40 %] 30 % Set Rate:  [12 bmp] 12 bmp Vt Set:  [650 mL] 650 mL PEEP:  [5 cmH20] 5 cmH20 Plateau Pressure:  [19 cmH20-21 cmH20] 20 cmH20  Physical Exam:  Intubated and sedated c-collar in place Lungs clear cv RRR Abd soft, non distended Ext:  VAC in place  Results for orders placed during the hospital encounter of 02/21/12 (from the past 24 hour(s))  HEMOGLOBIN AND HEMATOCRIT, BLOOD     Status: Abnormal   Collection Time   02/22/12  2:30 PM      Component Value Range   Hemoglobin 8.8 (*) 13.0 - 17.0 g/dL   HCT 47.8 (*) 29.5 - 62.1 %  CBC     Status: Abnormal   Collection Time   02/23/12  4:15 AM      Component Value Range   WBC 11.3 (*) 4.0 - 10.5 K/uL   RBC 2.47 (*) 4.22 - 5.81 MIL/uL   Hemoglobin 7.6 (*) 13.0 - 17.0 g/dL   HCT 30.8 (*) 65.7 - 84.6 %   MCV 88.3  78.0 - 100.0 fL   MCH 30.8  26.0 - 34.0 pg   MCHC 34.9  30.0 - 36.0 g/dL   RDW 96.2  95.2 - 84.1 %   Platelets 94 (*) 150 - 400 K/uL  BASIC METABOLIC PANEL     Status: Abnormal   Collection Time   02/23/12  4:15 AM      Component Value Range   Sodium 142  135 - 145 mEq/L   Potassium 3.8  3.5 - 5.1 mEq/L   Chloride 112  96 - 112 mEq/L   CO2 27  19 - 32 mEq/L   Glucose, Bld 143 (*) 70 - 99 mg/dL   BUN 17  6 - 23 mg/dL   Creatinine, Ser 3.24  0.50 - 1.35 mg/dL   Calcium 7.4 (*) 8.4 - 10.5 mg/dL   GFR calc non Af Amer >90  >90 mL/min   GFR calc Af Amer >90  >90 mL/min     Assessment/Plan:   NEURO  Per neurosurg     PULM  Continue current vent settings.   Hold on weening until next week per neurosur request             RENAL  Good UOP,  lytes stable     GI  Tolerating tube feeds     ID  Continue current antibiotics     HEME  Hgb/hct up after two units           Global Issues      LOS: 2 days  Critical Care Total Time*: 15 Minutes  Ashton Sabine A 02/23/2012  *Care during the described time interval was provided by me and/or other providers on the critical care team.  I have reviewed this patient's available data, including medical history, events of note, physical examination and test results as part of my evaluation.

## 2012-02-23 NOTE — Progress Notes (Signed)
Patient ID: Stephen Buckley, male   DOB: Oct 22, 1950, 61 y.o.   MRN: [id number]  Wound VACS functioning well.  Minimal blood in tubes.  Will follow next week by Dr Carola Frost and [name], PA-C  Oris Drone. Aleda Grana [facility] [phone]  02/23/2012 11:10 AM

## 2012-02-24 LAB — BLOOD GAS, ARTERIAL
Acid-Base Excess: 1.7 mmol/L (ref 0.0–2.0)
FIO2: 0.3 %
O2 Saturation: 99 %
TCO2: 26.6 mmol/L (ref 0–100)
pCO2 arterial: 38.5 mmHg (ref 35.0–45.0)
pO2, Arterial: 110 mmHg — ABNORMAL HIGH (ref 80.0–100.0)

## 2012-02-24 LAB — CBC
HCT: 18.5 % — ABNORMAL LOW (ref 39.0–52.0)
Hemoglobin: 6.4 g/dL — CL (ref 13.0–17.0)
MCV: 90.7 fL (ref 78.0–100.0)
RBC: 2.04 MIL/uL — ABNORMAL LOW (ref 4.22–5.81)
WBC: 9.9 10*3/uL (ref 4.0–10.5)

## 2012-02-24 LAB — PREPARE RBC (CROSSMATCH)

## 2012-02-24 LAB — GLUCOSE, CAPILLARY
Glucose-Capillary: 137 mg/dL — ABNORMAL HIGH (ref 70–99)
Glucose-Capillary: 141 mg/dL — ABNORMAL HIGH (ref 70–99)
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
## 2012-02-24 NOTE — Progress Notes (Signed)
Subjective: Patient reports sedated and on vent.  Objective: Vital signs in last 24 hours: Temp:  [98 F (36.7 C)-100 F (37.8 C)] 99.6 F (37.6 C) (09/01 0845) Pulse Rate:  [78-96] 81  (09/01 0900) Resp:  [12-17] 12  (09/01 0900) BP: (103-141)/(50-70) 123/63 mmHg (09/01 0900) SpO2:  [99 %-100 %] 100 % (09/01 0900) FiO2 (%):  [30 %] 30 % (09/01 0800) Weight:  [70.7 kg (155 lb 13.8 oz)] 70.7 kg (155 lb 13.8 oz) (09/01 0500)  Intake/Output from previous day: 08/31 0701 - 09/01 0700 In: 3662.6 [I.V.:2122.6; Blood:350; NG/GT:1040; IV Piggyback:150] Out: 2125 [Urine:2025; Drains:100] Intake/Output this shift: Total I/O In: 472 [I.V.:82; Blood:350; NG/GT:40] Out: 140 [Urine:140]  Physical Exam: PERRL. Patient has been purposeful with arms off sedation, wearing mitts, not following commands. Has opened eyes.  Lab Results:  [facility] 02/24/12 0438 02/23/12 0415  WBC 9.9 11.3*  HGB 6.4* 7.6*  HCT 18.5* 21.8*  PLT 91* 94*   BMET  Basename 02/23/12 0415 02/22/12 0507  NA 142 141  K 3.8 4.5  CL 112 109  CO2 27 24  GLUCOSE 143* 155*  BUN 17 13  CREATININE 0.76 0.97  CALCIUM 7.4* 7.6*    Studies/Results: Ct Head Wo Contrast  02/23/2012  *RADIOLOGY REPORT*  Clinical Data: Decreased responsiveness.  Follow up intracranial hemorrhage.  CT HEAD WITHOUT CONTRAST  Technique:  Contiguous axial images were obtained from the base of the skull through the vertex without contrast.  Comparison: Unenhanced cranial CT yesterday and 02/21/2012.  Findings: No significant change in the hemorrhagic contusion involving the right frontal and temporal lobes, that there is significant increase in the amount of associated edema.  No change in the small convexity right subdural hematoma measuring approximately 6 mm in maximum thickness (image 20).  No change in the subarachnoid hemorrhage involving both cerebral convexities and the right sylvian fissure.  Increased mass effect with effacement of the  right lateral ventricle, effacement of the right-sided cortical sulci, and shift of the midline of approximately 5 mm (3 mm yesterday). No new areas of intracranial hemorrhage.  Mucus retention cyst or polyp in the left sphenoid sinus and mucosal thickening involving both sphenoid sinuses.  Mastoid air cells and middle ear cavities well-aerated.  Bilateral carotid siphon and left vertebral artery atherosclerosis.  Old blowout type fracture of the medial wall of the right orbit as noted previously.  IMPRESSION:  1.  Increased edema in the right cerebral hemisphere related to the hemorrhagic contusions in the right frontal and temporal lobes. This accounts for increased mass effect and shift of the midline to the left approximating 5 mm (3 mm yesterday). 2.  Stable convexity subdural hematoma on the right and stable bilateral convexities subarachnoid hemorrhage and subarachnoid hemorrhage in the right sylvian fissure. 3.  No new intracranial hemorrhage. 4.  Chronic bilateral sphenoid sinusitis.   Original Report Authenticated By: Arnell Sieving, M.D.    Ct Head Wo Contrast  02/22/2012  *RADIOLOGY REPORT*  Clinical Data: Head trauma  CT HEAD WITHOUT CONTRAST  Technique:  Contiguous axial images were obtained from the base of the skull through the vertex without contrast.  Comparison: CT 02/21/2012  Findings: There has been significant worsening of hemorrhagic contusion on the right.  There is increased parenchymal hemorrhage in the right lateral frontal lobe and also in the right temporal lobe.  Decrease in surrounding white matter edema and mass effect. There is also a small amount of hemorrhagic contusion in the left inferior frontal  lobe which is new.  Small right-sided subdural hematoma measures approximately 3 mm and is slightly larger.  Bilateral tentorial subdural hematoma.  Small amount of interhemispheric subdural blood.  Subarachnoid hemorrhage is present in the right sylvian fissure and over the  convexity.  This also has increased slightly in the interval.  Ventricle size is normal.  Mild midline shift 2.5 mm toward the left.  Facial fractures as described previously.  IMPRESSION: Significant progression of hemorrhagic contusion in the right frontal and temporal lobe of a new small hemorrhagic contusion in the left inferior frontal lobe.  Small right-sided subdural hematoma is slightly larger.  Increase of subarachnoid hemorrhage.   Original Report Authenticated By: Camelia Phenes, M.D.    Dg Chest Port 1 View  02/23/2012  *RADIOLOGY REPORT*  Clinical Data: Assess support apparatus.  Evaluate endotracheal tube.  PORTABLE CHEST - 1 VIEW  Comparison: 02/21/2012.  Findings: Endotracheal tube tip 56 mm from the carina.  Enteric tube is present with the proximal side port in the proximal stomach.  Basilar atelectasis.  No airspace disease or effusion. No pneumothorax.  Right upper extremity PICC is present with the tip terminating at the cavoatrial junction.  IMPRESSION:  1.  Support apparatus in good position.  Endotracheal tube tip 56 mm from the carina. 2.  Mild basilar atelectasis.   Original Report Authenticated By: Andreas Newport, M.D.     Assessment/Plan: Expected evolution of hemorrhagic contusions on CT. Exam stable.  Continue supportive care and ventilator.  Will repeat Head CT in am.  Situation reviewed with patient's girlfriend.    LOS: 3 days    Dorian Heckle, MD 02/24/2012, 9:28 AM

## 2012-02-25 ENCOUNTER — Inpatient Hospital Stay (HOSPITAL_COMMUNITY): Payer: Medicaid Other

## 2012-02-25 LAB — TYPE AND SCREEN
ABO/RH(D): B POS
Antibody Screen: NEGATIVE
Unit division: 0

## 2012-02-25 LAB — GLUCOSE, CAPILLARY
Glucose-Capillary: 131 mg/dL — ABNORMAL HIGH (ref 70–99)
Glucose-Capillary: 129 mg/dL — ABNORMAL HIGH (ref 70–99)
Glucose-Capillary: 122 mg/dL — ABNORMAL HIGH (ref 70–99)

## 2012-02-25 LAB — COMPREHENSIVE METABOLIC PANEL
Alkaline Phosphatase: 51 U/L (ref 39–117)
BUN: 18 mg/dL (ref 6–23)
CO2: 27 mEq/L (ref 19–32)
Chloride: 116 mEq/L — ABNORMAL HIGH (ref 96–112)
Creatinine, Ser: 0.63 mg/dL (ref 0.50–1.35)
GFR calc non Af Amer: >90 mL/min (ref >90)
Glucose, Bld: 126 mg/dL — ABNORMAL HIGH (ref 70–99)
Potassium: 3.6 mEq/L (ref 3.5–5.1)
Total Bilirubin: 0.5 mg/dL (ref 0.3–1.2)

## 2012-02-25 LAB — PREALBUMIN: Prealbumin: 6.6 mg/dL — ABNORMAL LOW (ref 17.0–34.0)

## 2012-02-25 LAB — CBC
HCT: 25.7 % — ABNORMAL LOW (ref 39.0–52.0)
Hemoglobin: 8.7 g/dL — ABNORMAL LOW (ref 13.0–17.0)
MCV: 88.6 fL (ref 78.0–100.0)
RBC: 2.9 MIL/uL — ABNORMAL LOW (ref 4.22–5.81)
WBC: 10.2 10*3/uL (ref 4.0–10.5)

## 2012-02-25 MED ORDER — CEFAZOLIN SODIUM 1-5 GM-% IV SOLN
1.0000 g | Freq: Once | INTRAVENOUS | Status: AC
  Administered 2012-02-26: 1 g via INTRAVENOUS
  Filled 2012-02-25: qty 50
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
## 2012-02-25 NOTE — Progress Notes (Signed)
Subjective: Patient reports opens eyes  Objective: Vital signs in last 24 hours: Temp:  [98.6 F (37 C)-100.2 F (37.9 C)] 99 F (37.2 C) (09/02 0400) Pulse Rate:  [68-90] 68  (09/02 0600) Resp:  [12-18] 12  (09/02 0600) BP: (110-134)/(54-68) 120/63 mmHg (09/02 0600) SpO2:  [97 %-100 %] 100 % (09/02 0600) FiO2 (%):  [30 %] 30 % (09/02 0600) Weight:  [69.6 kg (153 lb 7 oz)] 69.6 kg (153 lb 7 oz) (09/02 0500)  Intake/Output from previous day: 09/01 0701 - 09/02 0700 In: 3241 [I.V.:1801; Blood:350; NG/GT:1040; IV Piggyback:50] Out: 2140 [Urine:2140] Intake/Output this shift: Total I/O In: 1319 [I.V.:879; NG/GT:440] Out: 915 [Urine:915]  Physical Exam: Opens eyes, attends, not following commands, spontaneously moves extremities and withdraws  Lab Results:  [facility] 02/25/12 0440 02/24/12 0438  WBC 10.2 9.9  HGB 8.7* 6.4*  HCT 25.7* 18.5*  PLT 119* 91*   BMET  Basename 02/25/12 0440 02/23/12 0415  NA 148* 142  K 3.6 3.8  CL 116* 112  CO2 27 27  GLUCOSE 126* 143*  BUN 18 17  CREATININE 0.63 0.76  CALCIUM 8.2* 7.4*    Studies/Results: Ct Head Wo Contrast  02/25/2012  *RADIOLOGY REPORT*  Clinical Data: Hemorrhagic contusions, follow-up  CT HEAD WITHOUT CONTRAST  Technique:  Contiguous axial images were obtained from the base of the skull through the vertex without contrast.  Comparison: 02/23/2012  Findings: Again identified numerous hemorrhagic contusions within the right frontal and temporal lobes. Additional small focus of hemorrhage at left subfrontal region stable. Small amount of scattered subarachnoid blood, decreased since previous exam. Posterior right parietal subdural hematoma decreased in size from 6 to 4 mm. Again identified extension of subdural hematoma along the right tentorium to the falx. Approximately 4 mm of right to left midline shift little changed. No new areas of hemorrhage identified. Persistent diffuse edema in the right hemisphere. No hydrocephalus  or new areas of infarction. Bones and sinuses stable.  IMPRESSION: No significant change in previously identified hemorrhagic contusions of the right cerebral hemisphere. Persistent right hemispheric edema with 4 mm of right-to-left midline shift, 5 mm previously. Decreased subarachnoid blood. Slightly decreased right posterior parietal subdural hematoma.   Original Report Authenticated By: Lollie Marrow, M.D.    Ct Head Wo Contrast  02/23/2012  *RADIOLOGY REPORT*  Clinical Data: Decreased responsiveness.  Follow up intracranial hemorrhage.  CT HEAD WITHOUT CONTRAST  Technique:  Contiguous axial images were obtained from the base of the skull through the vertex without contrast.  Comparison: Unenhanced cranial CT yesterday and 02/21/2012.  Findings: No significant change in the hemorrhagic contusion involving the right frontal and temporal lobes, that there is significant increase in the amount of associated edema.  No change in the small convexity right subdural hematoma measuring approximately 6 mm in maximum thickness (image 20).  No change in the subarachnoid hemorrhage involving both cerebral convexities and the right sylvian fissure.  Increased mass effect with effacement of the right lateral ventricle, effacement of the right-sided cortical sulci, and shift of the midline of approximately 5 mm (3 mm yesterday). No new areas of intracranial hemorrhage.  Mucus retention cyst or polyp in the left sphenoid sinus and mucosal thickening involving both sphenoid sinuses.  Mastoid air cells and middle ear cavities well-aerated.  Bilateral carotid siphon and left vertebral artery atherosclerosis.  Old blowout type fracture of the medial wall of the right orbit as noted previously.  IMPRESSION:  1.  Increased edema in the right cerebral  hemisphere related to the hemorrhagic contusions in the right frontal and temporal lobes. This accounts for increased mass effect and shift of the midline to the left approximating 5  mm (3 mm yesterday). 2.  Stable convexity subdural hematoma on the right and stable bilateral convexities subarachnoid hemorrhage and subarachnoid hemorrhage in the right sylvian fissure. 3.  No new intracranial hemorrhage. 4.  Chronic bilateral sphenoid sinusitis.   Original Report Authenticated By: Arnell Sieving, M.D.     Assessment/Plan: Head CT stable today.  Continue support with vent.  Still some shift (4mm).    LOS: 4 days    Dorian Heckle, MD 02/25/2012, 6:46 AM

## 2012-02-26 ENCOUNTER — Encounter (HOSPITAL_COMMUNITY): Payer: Self-pay | Admitting: Certified Registered"

## 2012-02-26 ENCOUNTER — Inpatient Hospital Stay (HOSPITAL_COMMUNITY): Payer: Medicaid Other

## 2012-02-26 ENCOUNTER — Inpatient Hospital Stay (HOSPITAL_COMMUNITY): Payer: Medicaid Other | Admitting: Certified Registered"

## 2012-02-26 ENCOUNTER — Encounter (HOSPITAL_COMMUNITY): Admission: EM | Disposition: A | Discharge status: Rehabilitation Facility | Payer: Self-pay | Source: Home / Self Care

## 2012-02-26 DIAGNOSIS — S065X9A Traumatic subdural hemorrhage with loss of consciousness of unspecified duration, initial encounter: Secondary | ICD-10-CM | POA: Diagnosis present

## 2012-02-26 DIAGNOSIS — D62 Acute posthemorrhagic anemia: Secondary | ICD-10-CM | POA: Diagnosis not present

## 2012-02-26 DIAGNOSIS — S066X9A Traumatic subarachnoid hemorrhage with loss of consciousness of unspecified duration, initial encounter: Secondary | ICD-10-CM | POA: Diagnosis present

## 2012-02-26 DIAGNOSIS — S022XXA Fracture of nasal bones, initial encounter for closed fracture: Secondary | ICD-10-CM | POA: Diagnosis present

## 2012-02-26 DIAGNOSIS — J95821 Acute postprocedural respiratory failure: Secondary | ICD-10-CM | POA: Diagnosis present

## 2012-02-26 DIAGNOSIS — S0240DA Maxillary fracture, left side, initial encounter for closed fracture: Secondary | ICD-10-CM | POA: Diagnosis present

## 2012-02-26 DIAGNOSIS — S0231XA Fracture of orbital floor, right side, initial encounter for closed fracture: Secondary | ICD-10-CM | POA: Diagnosis present

## 2012-02-26 LAB — GLUCOSE, CAPILLARY

## 2012-02-26 LAB — URINE MICROSCOPIC-ADD ON

## 2012-02-26 LAB — URINALYSIS, ROUTINE W REFLEX MICROSCOPIC
Bilirubin Urine: NEGATIVE
Glucose, UA: NEGATIVE mg/dL
Hgb urine dipstick: NEGATIVE
Specific Gravity, Urine: 1.017 (ref 1.005–1.030)

## 2012-02-26 LAB — CBC
HCT: 27.6 % — ABNORMAL LOW (ref 39.0–52.0)
Hemoglobin: 9 g/dL — ABNORMAL LOW (ref 13.0–17.0)
MCHC: 32.6 g/dL (ref 30.0–36.0)
MCV: 91.4 fL (ref 78.0–100.0)
WBC: 10.3 10*3/uL (ref 4.0–10.5)

## 2012-02-26 SURGERY — INCISION AND DRAINAGE WOUND WITH FASCIOTOMY
Anesthesia: General | Site: Leg Lower | Laterality: Right | Wound class: Clean

## 2012-02-26 MED ORDER — ACETAMINOPHEN 160 MG/5ML PO SOLN
650.0000 mg | ORAL | Status: DC | PRN
  Administered 2012-02-26 – 2012-02-27 (×2): 650 mg
  Filled 2012-02-26 (×3): qty 20.3

## 2012-02-26 MED ORDER — SODIUM CHLORIDE 0.9 % IR SOLN
Status: DC | PRN
  Administered 2012-02-26: 3000 mL

## 2012-02-26 MED ORDER — LACTATED RINGERS IV SOLN
INTRAVENOUS | Status: DC | PRN
  Administered 2012-02-26: 08:00:00 via INTRAVENOUS

## 2012-02-26 MED ORDER — ROCURONIUM BROMIDE 100 MG/10ML IV SOLN
INTRAVENOUS | Status: DC | PRN
  Administered 2012-02-26: 30 mg via INTRAVENOUS
  Administered 2012-02-26 (×2): 10 mg via INTRAVENOUS

## 2012-02-26 MED ORDER — SODIUM CHLORIDE 0.9 % IV SOLN
2500.0000 ug | INTRAVENOUS | Status: DC | PRN
  Administered 2012-02-26: 200 ug/h via INTRAVENOUS

## 2012-02-26 SURGICAL SUPPLY — 39 items
BANDAGE GAUZE ELAST BULKY 4 IN (GAUZE/BANDAGES/DRESSINGS) ×2 IMPLANT
BLADE SURG 10 STRL SS (BLADE) ×2 IMPLANT
BNDG ADH 5X4 AIR PERM ELC (GAUZE/BANDAGES/DRESSINGS) ×2
BNDG CMPR MED 10X6 ELC LF (GAUZE/BANDAGES/DRESSINGS) ×2
BNDG COHESIVE 4X5 TAN STRL (GAUZE/BANDAGES/DRESSINGS) IMPLANT
BNDG COHESIVE 4X5 WHT NS (GAUZE/BANDAGES/DRESSINGS) ×2 IMPLANT
BNDG ELASTIC 6X10 VLCR STRL LF (GAUZE/BANDAGES/DRESSINGS) ×2 IMPLANT
CLOTH BEACON ORANGE TIMEOUT ST (SAFETY) ×2 IMPLANT
COVER SURGICAL LIGHT HANDLE (MISCELLANEOUS) ×2 IMPLANT
DRAPE U-SHAPE 47X51 STRL (DRAPES) ×2 IMPLANT
DRSG ADAPTIC 3X8 NADH LF (GAUZE/BANDAGES/DRESSINGS) ×6 IMPLANT
DRSG MEPILEX BORDER 4X4 (GAUZE/BANDAGES/DRESSINGS) ×4 IMPLANT
ELECT REM PT RETURN 9FT ADLT (ELECTROSURGICAL) ×3
ELECTRODE REM PT RTRN 9FT ADLT (ELECTROSURGICAL) ×1 IMPLANT
GLOVE BIO SURGEON STRL SZ7.5 (GLOVE) ×2 IMPLANT
GLOVE BIO SURGEON STRL SZ8 (GLOVE) ×2 IMPLANT
GLOVE BIOGEL PI IND STRL 7.5 (GLOVE) ×1 IMPLANT
GLOVE BIOGEL PI IND STRL 8 (GLOVE) ×1 IMPLANT
GLOVE BIOGEL PI INDICATOR 7.5 (GLOVE) ×1
GLOVE BIOGEL PI INDICATOR 8 (GLOVE) ×1
GOWN PREVENTION PLUS XLARGE (GOWN DISPOSABLE) ×2 IMPLANT
GOWN STRL NON-REIN LRG LVL3 (GOWN DISPOSABLE) ×2 IMPLANT
KIT BASIN OR (CUSTOM PROCEDURE TRAY) ×2 IMPLANT
KIT ROOM TURNOVER OR (KITS) ×2 IMPLANT
NS IRRIG 1000ML POUR BTL (IV SOLUTION) ×2 IMPLANT
PACK ORTHO EXTREMITY (CUSTOM PROCEDURE TRAY) ×2 IMPLANT
PADDING CAST COTTON 6X4 STRL (CAST SUPPLIES) IMPLANT
SPONGE GAUZE 4X4 12PLY (GAUZE/BANDAGES/DRESSINGS) ×4 IMPLANT
SPONGE LAP 18X18 X RAY DECT (DISPOSABLE) ×2 IMPLANT
STOCKINETTE IMPERVIOUS 9X36 MD (GAUZE/BANDAGES/DRESSINGS) ×2 IMPLANT
SUT ETHILON 1 TP 1 60 (SUTURE) ×6 IMPLANT
SUT ETHILON 2 0 FS 18 (SUTURE) ×2 IMPLANT
SUT ETHILON 3 0 PS 1 (SUTURE) ×4 IMPLANT
SUT PDS AB 2-0 CT1 27 (SUTURE) ×6 IMPLANT
TOWEL OR 17X24 6PK STRL BLUE (TOWEL DISPOSABLE) ×2 IMPLANT
TOWEL OR 17X26 10 PK STRL BLUE (TOWEL DISPOSABLE) ×2 IMPLANT
TUBE CONNECTING 12X1/4 (SUCTIONS) ×2 IMPLANT
UNDERPAD 30X30 INCONTINENT (UNDERPADS AND DIAPERS) ×2 IMPLANT
YANKAUER SUCT BULB TIP NO VENT (SUCTIONS) ×2 IMPLANT

## 2012-02-26 NOTE — Progress Notes (Signed)
Pt back from OR without complications. Fiance updated on pt status.

## 2012-02-26 NOTE — Preoperative (Signed)
Beta Blockers   Reason not to administer Beta Blockers:Not Applicable 

## 2012-02-26 NOTE — Anesthesia Postprocedure Evaluation (Signed)
  Anesthesia Post-op Note  Patient: Stephen Buckley  Procedure(s) Performed: Procedure(s) (LRB) with comments: INCISION AND DRAINAGE WOUND WITH FASCIOTOMY (Right) - Closure of Right Leg Fasciotomy Wounds   Patient Location: SICU  Anesthesia Type: General  Level of Consciousness: sedated  Airway and Oxygen Therapy: Patient remains intubated per anesthesia plan  Post-op Pain: mild  Post-op Assessment: Patient's Cardiovascular Status Stable and Respiratory Function Stable  Post-op Vital Signs: stable  Complications: No apparent anesthesia complications

## 2012-02-26 NOTE — Progress Notes (Signed)
UR complete 
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
## 2012-02-26 NOTE — Progress Notes (Signed)
Pt transported to OR by RN, Respiratory, and OR tech. No complications. Report given to [facility], Scientist, clinical (histocompatibility and immunogenetics).

## 2012-02-26 NOTE — Progress Notes (Signed)
Orthopedic Tech Progress Note Patient Details:  Stephen Buckley [birth year] [id number]  Ortho Devices Type of Ortho Device: Other (comment) Ortho Device/Splint Location: right parfo boot Ortho Device/Splint Interventions: Application   Cammer, Mickie Bail 02/26/2012, 1:42 PM

## 2012-02-26 NOTE — Transfer of Care (Signed)
Immediate Anesthesia Transfer of Care Note  Patient: Stephen Buckley  Procedure(s) Performed: Procedure(s) (LRB): INCISION AND DRAINAGE WOUND WITH FASCIOTOMY (Right)  Patient Location: NICU  Anesthesia Type: General  Level of Consciousness: unresponsive  Airway & Oxygen Therapy: Patient remains intubated per anesthesia plan  Post-op Assessment: Report given to PACU RN and Post -op Vital signs reviewed and stable  Post vital signs: Reviewed and stable  Complications: No apparent anesthesia complications

## 2012-02-26 NOTE — Clinical Social Work Note (Addendum)
Clinical Social Worker spoke with patient girlfriend Reynaldo Minium [phone] / [phone]) over the phone to offer continued support and discuss patient home situation for discharge planning needs.  Patient girlfriend states that they live in condo type housing and they have a 1st floor and 2nd floor.  She feels certain that there is enough room for patient to sleep downstairs in a hospital bed if needed.  Patient girlfriend is home 24 hours a day to provide assistance as needed.  CSW spoke about different rehab options prior to discharge home and patient girlfriend in agreement at this time.  MD hopeful for extubation in the next few days and CSW will begin to explore SNF vs. CIR for discharge needs.  Clinical Social Worker remains available for emotional support and discharge planning needs.  Macario Golds, [place] [phone]

## 2012-02-26 NOTE — Brief Op Note (Signed)
02/21/2012 - 02/26/2012  9:06 AM  PATIENT:  Stephen Buckley  61 y.o. male  PRE-OPERATIVE DIAGNOSIS:  open fasciotomy wounds right leg   POST-OPERATIVE DIAGNOSIS:  open fasciotomy wound right leg  PROCEDURE:  Procedure(s) (LRB): IRRIGATION AND WOUND CLOSURE S/P FASCIOTOMY (Right), 38 CM TOTAL;  21 cm lateral layered with retention sutures, 17cm medial layered  SURGEON:  Surgeon(s) and Role:    * Budd Palmer, MD - Primary  PHYSICIAN ASSISTANT: [name], [facility]  ANESTHESIA:   general  EBL:  Total I/O In: 95 [I.V.:95] Out: 25 [Blood:25]  BLOOD ADMINISTERED:none  DRAINS: none   LOCAL MEDICATIONS USED:  NONE  SPECIMEN:  No Specimen  DISPOSITION OF SPECIMEN:  N/A  COUNTS:  YES  TOURNIQUET:  * No tourniquets in log *  DICTATION: .Other Dictation: Dictation Number [id number]  PLAN OF CARE: Admit to inpatient   PATIENT DISPOSITION:  ICU, stable   Delay start of Pharmacological VTE agent (>24hrs) due to surgical blood loss or risk of bleeding: no

## 2012-02-26 NOTE — Anesthesia Preprocedure Evaluation (Addendum)
Anesthesia Evaluation  Patient identified by MRN, date of birth, ID band Patient unresponsive    Reviewed: Allergy & Precautions, H&P , NPO status , Patient's Chart, lab work & pertinent test results  Airway       Dental   Pulmonary Current Smoker,  breath sounds clear to auscultation        Cardiovascular negative cardio ROS  Rhythm:Regular Rate:Normal     Neuro/Psych    GI/Hepatic GERD-  Medicated and Controlled,(+) Hepatitis -Patient's nurse states patient has Hepatitis C.  No note of such found in chart.   Endo/Other  negative endocrine ROS  Renal/GU negative Renal ROS     Musculoskeletal negative musculoskeletal ROS (+)   Abdominal   Peds  Hematology negative hematology ROS (+)   Anesthesia Other Findings   Reproductive/Obstetrics                         Anesthesia Physical Anesthesia Plan  ASA: III  Anesthesia Plan: General   Post-op Pain Management:    Induction: Intravenous  Airway Management Planned: Oral ETT  Additional Equipment:   Intra-op Plan:   Post-operative Plan: Post-operative intubation/ventilation  Informed Consent: I have reviewed the patients History and Physical, chart, labs and discussed the procedure including the risks, benefits and alternatives for the proposed anesthesia with the patient or authorized representative who has indicated his/her understanding and acceptance.     Plan Discussed with: Anesthesiologist, Surgeon and CRNA  Anesthesia Plan Comments:        Anesthesia Quick Evaluation

## 2012-02-26 NOTE — Progress Notes (Signed)
Patient ID: Stephen Buckley, male   DOB: 06/03/1951, 61 y.o.   MRN: [id number] Subjective: Patient reports intubated  Objective: Vital signs in last 24 hours: Temp:  [98.5 F (36.9 C)-100.7 F (38.2 C)] 98.8 F (37.1 C) (09/03 0400) Pulse Rate:  [71-101] 76  (09/03 0727) Resp:  [12-20] 13  (09/03 0727) BP: (114-195)/(57-95) 115/57 mmHg (09/03 0700) SpO2:  [98 %-100 %] 98 % (09/03 0727) FiO2 (%):  [30 %] 30 % (09/03 0727) Weight:  [70.7 kg (155 lb 13.8 oz)] 70.7 kg (155 lb 13.8 oz) (09/03 0500)  Intake/Output from previous day: 09/02 0701 - 09/03 0700 In: 2790.1 [I.V.:1760.1; NG/GT:740; IV Piggyback:200] Out: 3175 [Urine:2850; Drains:325] Intake/Output this shift:    eyes open, following commands, nodding yes and no  Lab Results:  Basename 02/25/12 0440 02/24/12 0438  WBC 10.2 9.9  HGB 8.7* 6.4*  HCT 25.7* 18.5*  PLT 119* 91*   BMET  Basename 02/25/12 0440  NA 148*  K 3.6  CL 116*  CO2 27  GLUCOSE 126*  BUN 18  CREATININE 0.63  CALCIUM 8.2*    Studies/Results: Ct Head Wo Contrast  02/25/2012  *RADIOLOGY REPORT*  Clinical Data: Hemorrhagic contusions, follow-up  CT HEAD WITHOUT CONTRAST  Technique:  Contiguous axial images were obtained from the base of the skull through the vertex without contrast.  Comparison: 02/23/2012  Findings: Again identified numerous hemorrhagic contusions within the right frontal and temporal lobes. Additional small focus of hemorrhage at left subfrontal region stable. Small amount of scattered subarachnoid blood, decreased since previous exam. Posterior right parietal subdural hematoma decreased in size from 6 to 4 mm. Again identified extension of subdural hematoma along the right tentorium to the falx. Approximately 4 mm of right to left midline shift little changed. No new areas of hemorrhage identified. Persistent diffuse edema in the right hemisphere. No hydrocephalus or new areas of infarction. Bones and sinuses stable.  IMPRESSION: No  significant change in previously identified hemorrhagic contusions of the right cerebral hemisphere. Persistent right hemispheric edema with 4 mm of right-to-left midline shift, 5 mm previously. Decreased subarachnoid blood. Slightly decreased right posterior parietal subdural hematoma.   Original Report Authenticated By: Lollie Marrow, M.D.     Assessment/Plan: Marked improvement. Will continue present rx.  LOS: 5 days  as above   Reinaldo Meeker, MD 02/26/2012, 7:49 AM
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
## 2012-02-26 NOTE — Progress Notes (Signed)
Hopefully secretions will improve and allow extubation over the next day or two. CXR ETT OK Patient examined and I agree with the assessment and plan  Violeta Gelinas, MD, MPH, FACS Pager: [phone]  02/26/2012 2:02 PM

## 2012-02-27 ENCOUNTER — Inpatient Hospital Stay (HOSPITAL_COMMUNITY): Payer: Medicaid Other

## 2012-02-27 ENCOUNTER — Encounter (HOSPITAL_COMMUNITY): Payer: Self-pay | Admitting: Orthopedic Surgery

## 2012-02-27 LAB — GLUCOSE, CAPILLARY
Glucose-Capillary: 113 mg/dL — ABNORMAL HIGH (ref 70–99)
Glucose-Capillary: 115 mg/dL — ABNORMAL HIGH (ref 70–99)

## 2012-02-27 LAB — CBC
HCT: 25.3 % — ABNORMAL LOW (ref 39.0–52.0)
Hemoglobin: 8.3 g/dL — ABNORMAL LOW (ref 13.0–17.0)
MCH: 30.2 pg (ref 26.0–34.0)
MCV: 92 fL (ref 78.0–100.0)
RBC: 2.75 MIL/uL — ABNORMAL LOW (ref 4.22–5.81)

## 2012-02-27 LAB — URINE CULTURE
Colony Count: NO GROWTH
Culture: NO GROWTH

## 2012-02-27 LAB — BASIC METABOLIC PANEL
BUN: 20 mg/dL (ref 6–23)
CO2: 28 mEq/L (ref 19–32)
Calcium: 8.3 mg/dL — ABNORMAL LOW (ref 8.4–10.5)
Creatinine, Ser: 0.69 mg/dL (ref 0.50–1.35)
Glucose, Bld: 124 mg/dL — ABNORMAL HIGH (ref 70–99)
Sodium: 151 mEq/L — ABNORMAL HIGH (ref 135–145)

## 2012-02-27 MED ORDER — PIPERACILLIN-TAZOBACTAM 3.375 G IVPB
3.3750 g | Freq: Three times a day (TID) | INTRAVENOUS | Status: DC
  Administered 2012-02-27 – 2012-03-03 (×14): 3.375 g via INTRAVENOUS
  Filled 2012-02-27 (×21): qty 50

## 2012-02-27 MED ORDER — SODIUM CHLORIDE 0.45 % IV SOLN
INTRAVENOUS | Status: DC
  Administered 2012-02-27: 50 mL/h via INTRAVENOUS

## 2012-02-27 MED ORDER — VANCOMYCIN HCL 1000 MG IV SOLR
1250.0000 mg | Freq: Two times a day (BID) | INTRAVENOUS | Status: DC
  Administered 2012-02-27 – 2012-03-02 (×8): 1250 mg via INTRAVENOUS
  Filled 2012-02-27 (×9): qty 1250
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
## 2012-02-27 NOTE — Progress Notes (Signed)
02/27/12 extubate pt to 4L . Pt. Is stable RT will continue monitor

## 2012-02-27 NOTE — Progress Notes (Signed)
Will extubate the patient .  Doing well with weaning.  If needs to be reintubated with use subglottic tube..  This patient has been seen and I agree with the findings and treatment plan.  Marta Lamas. Gae Bon, MD, FACS [phone] (pager) [phone] (direct pager) Trauma Surgeon

## 2012-02-27 NOTE — Progress Notes (Signed)
Patient ID: Stephen Buckley, male   DOB: [birth year], 61 y.o.   MRN: [id number] Neuro stable. Resting now but nurse says still following commands. Issue at this time is respiratory. Once weaned will need aggressive rehab. Will recheck scan tomorrow.
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
## 2012-02-28 ENCOUNTER — Inpatient Hospital Stay (HOSPITAL_COMMUNITY): Payer: Medicaid Other

## 2012-02-28 ENCOUNTER — Encounter (HOSPITAL_COMMUNITY): Payer: Self-pay | Admitting: Radiology

## 2012-02-28 DIAGNOSIS — I609 Nontraumatic subarachnoid hemorrhage, unspecified: Secondary | ICD-10-CM

## 2012-02-28 DIAGNOSIS — I619 Nontraumatic intracerebral hemorrhage, unspecified: Secondary | ICD-10-CM

## 2012-02-28 DIAGNOSIS — IMO0002 Reserved for concepts with insufficient information to code with codable children: Secondary | ICD-10-CM

## 2012-02-28 DIAGNOSIS — S82109B Unspecified fracture of upper end of unspecified tibia, initial encounter for open fracture type I or II: Secondary | ICD-10-CM

## 2012-02-28 DIAGNOSIS — R1310 Dysphagia, unspecified: Secondary | ICD-10-CM

## 2012-02-28 DIAGNOSIS — S069X9A Unspecified intracranial injury with loss of consciousness of unspecified duration, initial encounter: Secondary | ICD-10-CM

## 2012-02-28 DIAGNOSIS — S72453A Displaced supracondylar fracture without intracondylar extension of lower end of unspecified femur, initial encounter for closed fracture: Secondary | ICD-10-CM

## 2012-02-28 LAB — GLUCOSE, CAPILLARY
Glucose-Capillary: 116 mg/dL — ABNORMAL HIGH (ref 70–99)
Glucose-Capillary: 124 mg/dL — ABNORMAL HIGH (ref 70–99)
Glucose-Capillary: 133 mg/dL — ABNORMAL HIGH (ref 70–99)

## 2012-02-28 LAB — CBC
HCT: 27.6 % — ABNORMAL LOW (ref 39.0–52.0)
MCHC: 32.2 g/dL (ref 30.0–36.0)
Platelets: 221 10*3/uL (ref 150–400)
RDW: 14.8 % (ref 11.5–15.5)
WBC: 14.7 10*3/uL — ABNORMAL HIGH (ref 4.0–10.5)

## 2012-02-28 LAB — CULTURE, RESPIRATORY W GRAM STAIN

## 2012-02-28 MED ORDER — SERTRALINE HCL 50 MG PO TABS
50.0000 mg | ORAL_TABLET | Freq: Every day | ORAL | Status: DC
  Administered 2012-02-28 – 2012-03-03 (×5): 50 mg via ORAL
  Filled 2012-02-28 (×5): qty 1
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
## 2012-02-28 NOTE — Clinical Social Work Note (Signed)
Clinical Social Worker spoke with patient, patient girlfriend, and patient niece at bedside to offer continued support and discuss patient discharge plans.  Patient girlfriend is still able to provide 24 hour support at home and make arrangements for patient to stay on the first floor.  Patient is now extubated but shows definite signs of a Traumatic Brain Injury.  CSW awaiting inpatient rehab consult.  Patient girlfriend expressed hesitancy with SNF at this time.  Patient girlfriend has spoken with her landlord who has provided permission for patient to return home and her provide care to him in the home.  Patient with other family and friends available for support.  Patient has already applied for Disability per patient girlfriend.  Patient girlfriend is unsure but feels as though patient also applied for Medicaid.  Financial Counseling to see patient and patient family to get remaining details.  CSW will continue to follow for support and to facilitate patient discharge needs if appropriate.  Stephen Buckley, [place] [phone]
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
## 2012-02-28 NOTE — Evaluation (Signed)
SLP has reviewed and agrees with student's note below.  Breck Coons [employer] [phone]  02/28/2012
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
## 2012-02-28 NOTE — Progress Notes (Signed)
Pt reviewed in hospital LOS meeting today.  

## 2012-02-29 DIAGNOSIS — E876 Hypokalemia: Secondary | ICD-10-CM

## 2012-02-29 LAB — CBC
Platelets: 282 10*3/uL (ref 150–400)
RBC: 3.07 MIL/uL — ABNORMAL LOW (ref 4.22–5.81)
WBC: 14.1 10*3/uL — ABNORMAL HIGH (ref 4.0–10.5)

## 2012-02-29 LAB — BASIC METABOLIC PANEL
CO2: 25 mEq/L (ref 19–32)
Chloride: 111 mEq/L (ref 96–112)
GFR calc Af Amer: >90 mL/min (ref >90)
Potassium: 3.2 mEq/L — ABNORMAL LOW (ref 3.5–5.1)
Sodium: 144 mEq/L (ref 135–145)

## 2012-02-29 LAB — GLUCOSE, CAPILLARY
Glucose-Capillary: 110 mg/dL — ABNORMAL HIGH (ref 70–99)
Glucose-Capillary: 94 mg/dL (ref 70–99)

## 2012-02-29 MED ORDER — DOCUSATE SODIUM 100 MG PO CAPS
100.0000 mg | ORAL_CAPSULE | Freq: Two times a day (BID) | ORAL | Status: DC
  Administered 2012-02-29 – 2012-03-03 (×7): 100 mg via ORAL
  Filled 2012-02-29 (×8): qty 1

## 2012-02-29 MED ORDER — ADULT MULTIVITAMIN W/MINERALS CH
1.0000 | ORAL_TABLET | Freq: Every day | ORAL | Status: DC
  Administered 2012-02-29 – 2012-03-03 (×4): 1 via ORAL
  Filled 2012-02-29 (×4): qty 1

## 2012-02-29 MED ORDER — ZOLPIDEM TARTRATE 5 MG PO TABS
5.0000 mg | ORAL_TABLET | Freq: Once | ORAL | Status: AC
  Administered 2012-02-29: 5 mg via ORAL
  Filled 2012-02-29: qty 1

## 2012-02-29 MED ORDER — PANTOPRAZOLE SODIUM 40 MG PO TBEC
40.0000 mg | DELAYED_RELEASE_TABLET | Freq: Every day | ORAL | Status: DC
  Administered 2012-02-29: 40 mg via ORAL
  Filled 2012-02-29: qty 1

## 2012-02-29 MED ORDER — HYDROMORPHONE HCL PF 1 MG/ML IJ SOLN
0.5000 mg | INTRAMUSCULAR | Status: DC | PRN
  Administered 2012-03-01 – 2012-03-03 (×10): 0.5 mg via INTRAVENOUS
  Filled 2012-02-29 (×10): qty 1

## 2012-02-29 NOTE — Progress Notes (Signed)
Pt pulled his PICC line out. Picc catheter intact, bleeding stopped with direct pressure. Pressure dressing applied. Sitter states  "turned my back to look in the closet and the patient pulled out the picc line".
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
## 2012-02-29 NOTE — Progress Notes (Signed)
Physical Therapy Treatment Patient Details Name: Stephen Buckley MRN: [id number] DOB: 06-Oct-1950 Today's Date: 02/29/2012 Time: 4098-1191 PT Time Calculation (min): 30 min  PT Assessment / Plan / Recommendation Comments on Treatment Session  pt continues to have problems maintaining focus on task.  With cuing, pt made attempts to assist, but lost focus before completion of the task.    Follow Up Recommendations  Inpatient Rehab    Barriers to Discharge        Equipment Recommendations  None recommended by SLP    Recommendations for Other Services Rehab consult  Frequency Min 3X/week   Plan Discharge plan remains appropriate;Frequency remains appropriate    Precautions / Restrictions Precautions Precautions: Fall Restrictions RLE Weight Bearing: Non weight bearing LLE Weight Bearing: Non weight bearing   Pertinent Vitals/Pain     Mobility  Bed Mobility Bed Mobility: Supine to Sit;Sitting - Scoot to Edge of Bed Supine to Sit: 1: +2 Total assist;HOB flat Supine to Sit: Patient Percentage: 20% Sitting - Scoot to Edge of Bed: 1: +2 Total assist Sitting - Scoot to Edge of Bed: Patient Percentage: 10% Details for Bed Mobility Assistance: v/tc's for guidance and to keep on task; truncal assist significant throughout Transfers Transfers: Counselling psychologist Transfer: 1: +2 Total assist Anterior-Posterior Transfers: Patient Percentage:  (15%) Details for Transfer Assistance: vc's for help keeping pt focused on task. Hands/UE;s placed appropriately to get pt participation.  Significant truncal assist Ambulation/Gait Ambulation/Gait Assistance: Not tested (comment) Stairs: No Wheelchair Mobility Wheelchair Mobility: No    Exercises     PT Diagnosis:    PT Problem List:   PT Treatment Interventions:     PT Goals Acute Rehab PT Goals Time For Goal Achievement: 03/13/12 Potential to Achieve Goals: Good PT Goal: Supine/Side to Sit - Progress:  Progressing toward goal PT Transfer Goal: Bed to Chair/Chair to Bed - Progress: Not met  Visit Information  Last PT Received On: 02/29/12 Assistance Needed: +2 PT/OT Co-Evaluation/Treatment: Yes    Subjective Data      Cognition  Overall Cognitive Status: Impaired Arousal/Alertness: Awake/alert Orientation Level: Disoriented to;Place;Time;Situation Behavior During Session: Restless Current Attention Level: Sustained Following Commands: Follows multi-step commands inconsistently Safety/Judgement: Decreased awareness of safety precautions;Decreased safety judgement for tasks assessed;Impulsive;Decreased awareness of need for assistance    Balance  Balance Balance Assessed: Yes Static Sitting Balance Static Sitting - Balance Support: Feet unsupported;No upper extremity supported Static Sitting - Level of Assistance: 1: +1 Total assist Static Sitting - Comment/# of Minutes: 8  End of Session PT - End of Session Activity Tolerance: Patient tolerated treatment well Patient left: in chair;with call bell/phone within reach;with family/visitor present Nurse Communication: Mobility status;Need for lift equipment;Other (comment)   GP     Danyia Borunda, Eliseo Gum 02/29/2012, 12:57 PM  02/29/2012  Irwinton Bing, PT [phone] (pager)

## 2012-02-29 NOTE — Progress Notes (Signed)
Patient seen and examined.  Agree with PA's note.  
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
## 2012-02-29 NOTE — Clinical Social Work Note (Signed)
Clinical Social Worker continuing to follow patient for emotional support and discharge needs.  Per chart and phone conversation with inpatient rehab admissions coordinator patient will be admitted to inpatient rehab next week once medically stable.  Patient girlfriend in agreement with this plan and plans to provide care at discharge.  Patient girlfriend is unable to get home over the weekend with limited/no transportation home and back to the hospital, therefore CSW provided with food vouchers for the weekend.  Patient girlfriend overly appreciative.  CSW remains available for support as needed.  Macario Golds, [place] [phone]
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
## 2012-02-29 NOTE — Progress Notes (Signed)
Speech Language Pathology Dysphagia Treatment Patient Details Name: Stephen Buckley MRN: [id number] DOB: 03/22/1951 Today's Date: 02/29/2012 Time: 5621-3086 SLP Time Calculation (min): 18 min  Assessment / Plan / Recommendation Clinical Impression  Py. seen for dysphagia treatment and possibility of liquid upgrade.  Pt. in recliner with family present.  He continues to be confused requiring frequent redirection,however calmer and less restless than yesterday.  SLP provided total assit during cup sips of thin water due to significant impulsivity and attempts to drink entire cup in consecutive sips.  Intermittent wet vocal quality and consistent delayed cough with water observed.  Nectar thick juice significantly reduced s/s aspiration.  Educated girlfriend regarding impulsivity as she stated several times "he needs more to drink, he drinks it all in one gulp" as well as swallow precautions and clinical rationale.  Recommend he continue Dys 2 diet with nectar thick liquids over weekend and SLP can f/u Monday for potential diet and liquid upgrade.    Diet Recommendation  Continue with Current Diet: Dysphagia 2 (fine chop);Nectar-thick liquid    SLP Plan Continue with current plan of care       Swallowing Goals  SLP Swallowing Goals Patient will consume recommended diet without observed clinical signs of aspiration with: Moderate assistance Swallow Study Goal #1 - Progress: Progressing toward goal Patient will utilize recommended strategies during swallow to increase swallowing safety with: Moderate assistance Swallow Study Goal #2 - Progress: Progressing toward goal  General Respiratory Status: Room air Behavior/Cognition: Confused;Impulsive;Distractible;Requires cueing;Decreased sustained attention Oral Cavity - Dentition: Poor condition;Missing dentition Patient Positioning: Upright in chair  Oral Cavity - Oral Hygiene Does patient have any of the following "at risk" factors?: Diet -  patient on thickened liquids;Other - dysphagia Brush patient's teeth BID with toothbrush (using toothpaste with fluoride): Yes Patient is HIGH RISK - Oral Care Protocol followed (see row info): Yes   Dysphagia Treatment Treatment focused on: Skilled observation of diet tolerance;Upgraded PO texture trials;Patient/family/caregiver education Treatment Methods/Modalities: Skilled observation Patient observed directly with PO's: Yes Type of PO's observed: Thin liquids;Nectar-thick liquids Feeding: Needs assist Liquids provided via: Cup Oral Phase Signs & Symptoms: Anterior loss/spillage Pharyngeal Phase Signs & Symptoms: Delayed cough;Wet vocal quality Type of cueing: Verbal;Tactile;Visual Amount of cueing: Total   GO     Breck Coons [place].Ed [employer] [phone]
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
## 2012-02-29 NOTE — Progress Notes (Signed)
Report called, Pt transferring to 5N18 via bed with belongings, girlfriend and sitter. Stephen Buckley

## 2012-02-29 NOTE — Progress Notes (Signed)
UR complete 
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
## 2012-02-29 NOTE — Progress Notes (Signed)
Patient ID: Stephen Buckley, male   DOB: 02-Jun-1951, 61 y.o.   MRN: [id number] Patient now extubated and in intermediate care. Is awake, alert, conversant. Follows complex commands.  Impression is doing much better. Should be ready for disposition soon.

## 2012-02-29 NOTE — Progress Notes (Signed)
I met with patient and his girlfriend at bedside. Patient will benefit from an inpt rehab stay and can be admitted next week when felt medically ready. GF in agreement. [phone]

## 2012-03-01 LAB — BASIC METABOLIC PANEL
BUN: 12 mg/dL (ref 6–23)
Calcium: 8.9 mg/dL (ref 8.4–10.5)
GFR calc Af Amer: >90 mL/min (ref >90)
GFR calc non Af Amer: >90 mL/min (ref >90)
Glucose, Bld: 110 mg/dL — ABNORMAL HIGH (ref 70–99)
Sodium: 141 mEq/L (ref 135–145)

## 2012-03-01 MED ORDER — RESOURCE THICKENUP CLEAR PO POWD
ORAL | Status: DC | PRN
  Filled 2012-03-01: qty 125

## 2012-03-01 MED ORDER — WHITE PETROLATUM GEL
Status: AC
  Administered 2012-03-01: 11:00:00
  Filled 2012-03-01: qty 5
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
## 2012-03-01 NOTE — Progress Notes (Signed)
Patient pulled off his condom catheter around midnight. Non restraint mitts were placed at this time to prevent him from pulling his IV or condom cath.

## 2012-03-01 NOTE — Progress Notes (Signed)
Administered with dilaudid 1 mg via IV as per MD's order. Patient sleeping with family and sitter at bedside.

## 2012-03-01 NOTE — Progress Notes (Signed)
Patient noted with attempts to slide himself out of lounge chair by bedside. Staff assisted him back to bed via saralift.

## 2012-03-01 NOTE — Progress Notes (Signed)
Subjective: Patient no c/o  Objective: Vital signs in last 24 hours: Temp:  [98.5 F (36.9 C)-100.6 F (38.1 C)] 100.1 F (37.8 C) (09/07 0646) Pulse Rate:  [71-73] 73  (09/07 0646) Resp:  [18-32] 18  (09/07 0646) BP: (144-151)/(67-76) 151/67 mmHg (09/07 0646) SpO2:  [100 %] 100 % (09/07 0646)  Intake/Output from previous day: 09/06 0701 - 09/07 0700 In: 2040 [P.O.:1440; IV Piggyback:600] Out: 775 [Urine:775] Intake/Output this shift:    PE -  AAOx2 , FC all 4   Lab Results:  [facility] 02/29/12 0356 02/28/12 0448  WBC 14.1* 14.7*  HGB 9.2* 8.9*  HCT 27.6* 27.6*  PLT 282 221   BMET  Basename 03/01/12 0630 02/29/12 0356  NA 141 144  K 3.5 3.2*  CL 109 111  CO2 23 25  GLUCOSE 110* 122*  BUN 12 12  CREATININE 0.70 0.58  CALCIUM 8.9 8.6    Studies/Results: No results found.  Assessment/Plan: Making progress - rehab next week  LOS: 9 days     Maxon Kresse R, MD 03/01/2012, 9:32 AM
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
## 2012-03-02 MED ORDER — HYDROCODONE-ACETAMINOPHEN 5-325 MG PO TABS
1.0000 | ORAL_TABLET | ORAL | Status: DC | PRN
  Administered 2012-03-02 – 2012-03-03 (×4): 2 via ORAL
  Filled 2012-03-02 (×4): qty 2

## 2012-03-02 MED ORDER — LORAZEPAM 2 MG/ML IJ SOLN
1.0000 mg | Freq: Three times a day (TID) | INTRAMUSCULAR | Status: DC | PRN
  Filled 2012-03-02: qty 1

## 2012-03-02 MED ORDER — VANCOMYCIN HCL IN DEXTROSE 1-5 GM/200ML-% IV SOLN
1000.0000 mg | Freq: Three times a day (TID) | INTRAVENOUS | Status: DC
  Administered 2012-03-02 – 2012-03-03 (×4): 1000 mg via INTRAVENOUS
  Filled 2012-03-02 (×6): qty 200

## 2012-03-02 NOTE — Progress Notes (Signed)
Neuro unchanged. Waiting for rehabilitation
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
## 2012-03-02 NOTE — Progress Notes (Signed)
Patient continues to exhibit s/s of confusion using inappropriate language: profanity and vulgar language on male care providers. Pulling tubings and drains. Mittens applied to both hands occasionally to prevent harming himself. Administered pain reliever as ordered. Tolerating well thickened po fluids. Dr Corliss Skains notified regarding patient's behavior. Dr Ezzard Standing visited and spoke to patient and ordered vicodin for pain control. Will continue to provide nursing care and monitor to prevent injury.

## 2012-03-03 ENCOUNTER — Inpatient Hospital Stay (HOSPITAL_COMMUNITY)
Admission: EM | Admit: 2012-03-03 | Discharge: 2012-03-27 | DRG: 946 | Disposition: A | Discharge status: Home Health | Payer: Medicaid Other | Source: Other Acute Inpatient Hospital | Attending: Physical Medicine & Rehabilitation | Admitting: Physical Medicine & Rehabilitation

## 2012-03-03 DIAGNOSIS — S0633AA Contusion and laceration of cerebrum, unspecified, with loss of consciousness status unknown, initial encounter: Secondary | ICD-10-CM

## 2012-03-03 DIAGNOSIS — Z9889 Other specified postprocedural states: Secondary | ICD-10-CM

## 2012-03-03 DIAGNOSIS — Y9301 Activity, walking, marching and hiking: Secondary | ICD-10-CM

## 2012-03-03 DIAGNOSIS — R1310 Dysphagia, unspecified: Secondary | ICD-10-CM

## 2012-03-03 DIAGNOSIS — S82209B Unspecified fracture of shaft of unspecified tibia, initial encounter for open fracture type I or II: Secondary | ICD-10-CM

## 2012-03-03 DIAGNOSIS — S022XXA Fracture of nasal bones, initial encounter for closed fracture: Secondary | ICD-10-CM

## 2012-03-03 DIAGNOSIS — S069X9A Unspecified intracranial injury with loss of consciousness of unspecified duration, initial encounter: Secondary | ICD-10-CM

## 2012-03-03 DIAGNOSIS — J189 Pneumonia, unspecified organism: Secondary | ICD-10-CM

## 2012-03-03 DIAGNOSIS — Z5189 Encounter for other specified aftercare: Secondary | ICD-10-CM

## 2012-03-03 DIAGNOSIS — F329 Major depressive disorder, single episode, unspecified: Secondary | ICD-10-CM

## 2012-03-03 DIAGNOSIS — D72829 Elevated white blood cell count, unspecified: Secondary | ICD-10-CM

## 2012-03-03 DIAGNOSIS — S0280XA Fracture of other specified skull and facial bones, unspecified side, initial encounter for closed fracture: Secondary | ICD-10-CM

## 2012-03-03 DIAGNOSIS — IMO0002 Reserved for concepts with insufficient information to code with codable children: Secondary | ICD-10-CM

## 2012-03-03 DIAGNOSIS — S72309A Unspecified fracture of shaft of unspecified femur, initial encounter for closed fracture: Secondary | ICD-10-CM

## 2012-03-03 DIAGNOSIS — S72409A Unspecified fracture of lower end of unspecified femur, initial encounter for closed fracture: Secondary | ICD-10-CM

## 2012-03-03 DIAGNOSIS — S82109A Unspecified fracture of upper end of unspecified tibia, initial encounter for closed fracture: Secondary | ICD-10-CM

## 2012-03-03 DIAGNOSIS — S06330A Contusion and laceration of cerebrum, unspecified, without loss of consciousness, initial encounter: Secondary | ICD-10-CM

## 2012-03-03 DIAGNOSIS — Y92410 Unspecified street and highway as the place of occurrence of the external cause: Secondary | ICD-10-CM

## 2012-03-03 DIAGNOSIS — K5900 Constipation, unspecified: Secondary | ICD-10-CM

## 2012-03-03 DIAGNOSIS — F17200 Nicotine dependence, unspecified, uncomplicated: Secondary | ICD-10-CM

## 2012-03-03 DIAGNOSIS — F3289 Other specified depressive episodes: Secondary | ICD-10-CM

## 2012-03-03 DIAGNOSIS — D649 Anemia, unspecified: Secondary | ICD-10-CM

## 2012-03-03 LAB — CULTURE, BLOOD (ROUTINE X 2): Culture: NO GROWTH

## 2012-03-03 MED ORDER — LORAZEPAM 2 MG/ML IJ SOLN
0.5000 mg | INTRAMUSCULAR | Status: DC | PRN
  Administered 2012-03-24: 0.5 mg via INTRAMUSCULAR
  Filled 2012-03-03 (×3): qty 1

## 2012-03-03 MED ORDER — SERTRALINE HCL 50 MG PO TABS
50.0000 mg | ORAL_TABLET | Freq: Every day | ORAL | Status: DC
  Administered 2012-03-04 – 2012-03-14 (×11): 50 mg via ORAL
  Filled 2012-03-03 (×15): qty 1

## 2012-03-03 MED ORDER — ONDANSETRON HCL 4 MG/2ML IJ SOLN: 4.0000 mg | Freq: Four times a day (QID) | INTRAMUSCULAR | Status: DC | PRN

## 2012-03-03 MED ORDER — LORAZEPAM 0.5 MG PO TABS
0.5000 mg | ORAL_TABLET | ORAL | Status: DC | PRN
  Administered 2012-03-03 – 2012-03-27 (×50): 0.5 mg via ORAL
  Filled 2012-03-03 (×53): qty 1

## 2012-03-03 MED ORDER — ONDANSETRON HCL 4 MG PO TABS: 4.0000 mg | ORAL_TABLET | Freq: Four times a day (QID) | ORAL | Status: DC | PRN

## 2012-03-03 MED ORDER — RESOURCE THICKENUP CLEAR PO POWD
ORAL | Status: DC | PRN
  Filled 2012-03-03: qty 125

## 2012-03-03 MED ORDER — ACETAMINOPHEN 325 MG PO TABS
325.0000 mg | ORAL_TABLET | ORAL | Status: DC | PRN
  Administered 2012-03-13: 650 mg via ORAL
  Filled 2012-03-03: qty 2

## 2012-03-03 MED ORDER — SORBITOL 70 % SOLN
30.0000 mL | Freq: Every day | Status: DC | PRN
  Administered 2012-03-14 – 2012-03-17 (×3): 30 mL via ORAL
  Filled 2012-03-03 (×3): qty 30

## 2012-03-03 MED ORDER — HYDROCODONE-ACETAMINOPHEN 5-325 MG PO TABS
1.0000 | ORAL_TABLET | ORAL | Status: DC | PRN
  Administered 2012-03-03 – 2012-03-05 (×8): 2 via ORAL
  Administered 2012-03-05: 1 via ORAL
  Administered 2012-03-05 – 2012-03-06 (×3): 2 via ORAL
  Administered 2012-03-07: 1 via ORAL
  Administered 2012-03-07 (×4): 2 via ORAL
  Administered 2012-03-07 – 2012-03-08 (×2): 1 via ORAL
  Administered 2012-03-08 – 2012-03-09 (×4): 2 via ORAL
  Administered 2012-03-09: 1 via ORAL
  Administered 2012-03-10 (×2): 2 via ORAL
  Administered 2012-03-10 (×2): 1 via ORAL
  Administered 2012-03-10 – 2012-03-11 (×2): 2 via ORAL
  Administered 2012-03-11 (×3): 1 via ORAL
  Administered 2012-03-12 – 2012-03-20 (×13): 2 via ORAL
  Administered 2012-03-21 – 2012-03-22 (×2): 1 via ORAL
  Administered 2012-03-23 – 2012-03-25 (×6): 2 via ORAL
  Administered 2012-03-25: 1 via ORAL
  Administered 2012-03-25: 2 via ORAL
  Administered 2012-03-26: 1 via ORAL
  Administered 2012-03-26: 2 via ORAL
  Administered 2012-03-26 (×2): 1 via ORAL
  Administered 2012-03-27 (×2): 2 via ORAL
  Filled 2012-03-03: qty 1
  Filled 2012-03-03: qty 2
  Filled 2012-03-03: qty 1
  Filled 2012-03-03 (×5): qty 2
  Filled 2012-03-03: qty 1
  Filled 2012-03-03 (×4): qty 2
  Filled 2012-03-03: qty 1
  Filled 2012-03-03 (×3): qty 2
  Filled 2012-03-03: qty 1
  Filled 2012-03-03: qty 2
  Filled 2012-03-03 (×3): qty 1
  Filled 2012-03-03 (×6): qty 2
  Filled 2012-03-03: qty 1
  Filled 2012-03-03 (×7): qty 2
  Filled 2012-03-03: qty 1
  Filled 2012-03-03 (×9): qty 2
  Filled 2012-03-03: qty 1
  Filled 2012-03-03: qty 2
  Filled 2012-03-03: qty 1
  Filled 2012-03-03 (×13): qty 2
  Filled 2012-03-03: qty 1
  Filled 2012-03-03: qty 2

## 2012-03-03 MED ORDER — ADULT MULTIVITAMIN W/MINERALS CH
1.0000 | ORAL_TABLET | Freq: Every day | ORAL | Status: DC
  Administered 2012-03-04 – 2012-03-12 (×9): 1 via ORAL
  Filled 2012-03-03 (×13): qty 1

## 2012-03-03 MED ORDER — BIOTENE DRY MOUTH MT LIQD
15.0000 mL | Freq: Four times a day (QID) | OROMUCOSAL | Status: DC
  Administered 2012-03-04 – 2012-03-19 (×25): 15 mL via OROMUCOSAL
  Filled 2012-03-03: qty 15

## 2012-03-03 MED ORDER — CHLORHEXIDINE GLUCONATE 0.12 % MT SOLN
15.0000 mL | Freq: Two times a day (BID) | OROMUCOSAL | Status: DC
  Administered 2012-03-03 – 2012-03-19 (×27): 15 mL via OROMUCOSAL
  Filled 2012-03-03 (×36): qty 15

## 2012-03-03 MED ORDER — POLYETHYLENE GLYCOL 3350 17 G PO PACK
17.0000 g | PACK | Freq: Every day | ORAL | Status: DC | PRN
  Administered 2012-03-14: 17 g via ORAL
  Filled 2012-03-03 (×2): qty 1

## 2012-03-03 NOTE — Progress Notes (Signed)
CIR pending, I also spoke to the patient's girlfriend. Patient examined and I agree with the assessment and plan  Violeta Gelinas, MD, MPH, FACS Pager: [phone]  03/03/2012 2:10 PM
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
## 2012-03-03 NOTE — Progress Notes (Addendum)
Speech Language Pathology Dysphagia Treatment Patient Details Name: Stephen Buckley MRN: [id number] DOB: 26-Oct-1950 Today's Date: 03/03/2012 Time: 4098-1191 SLP Time Calculation (min): 25 min  Assessment / Plan / Recommendation Clinical Impression  Treatment focused on dysphagia and cognitive therapy and ability to upgrade to thin at bedside.  Girlfriend reports she has been giving him thin water over the weekend via a sippy cup.  SLP reviewed recommendations thus far for nectar thick and clinical rationale.  Pt. exhibited one delayed cough out of 6 sips thin liquids.  He continues to need verbal/tactile/visual cues to take small single sips.  Pt. is congested and coughs without food/liquid therefore difficult to assess safety with thin at bedside.  SLP has been unable to recommend thin liquids via bedside observation thus far.  Given continuing s/s of penetration and aspiration at bedside, recent temp spikes (1013 Sat.), recommend an MBS to determine safest liquid consistency.  Stephen Buckley continues to exhibit disorientation to place and situation.  He required max verbal and visual cues to sustain attention to task as he is highly distractible and needs frequent redirection.  MBS will be scheduled for 9/10.    Diet Recommendation  Continue with Current Diet: Dysphagia 2 (fine chop);Nectar-thick liquid    SLP Plan MBS       Swallowing Goals  SLP Swallowing Goals Patient will consume recommended diet without observed clinical signs of aspiration with: Moderate assistance Swallow Study Goal #1 - Progress: Progressing toward goal Patient will utilize recommended strategies during swallow to increase swallowing safety with: Moderate assistance Swallow Study Goal #2 - Progress: Progressing toward goal  General Temperature Spikes Noted: Yes (101.3 on 9/7, 100.6 on 9/6) Respiratory Status: Room air Behavior/Cognition: Impulsive;Distractible;Requires cueing;Decreased sustained  attention;Confused;Alert Oral Cavity - Dentition: Poor condition;Missing dentition Patient Positioning: Upright in bed  Oral Cavity - Oral Hygiene Does patient have any of the following "at risk" factors?: None of the above Brush patient's teeth BID with toothbrush (using toothpaste with fluoride): Yes Patient is HIGH RISK - Oral Care Protocol followed (see row info): Yes   Dysphagia Treatment Treatment focused on: Skilled observation of diet tolerance;Upgraded PO texture trials;Patient/family/caregiver education Treatment Methods/Modalities: Skilled observation Patient observed directly with PO's: Yes Type of PO's observed: Thin liquids;Nectar-thick liquids;Dysphagia 2 (chopped) Feeding: Needs assist Liquids provided via: Cup (family brought in sippy cup) Oral Phase Signs & Symptoms: Anterior loss/spillage Pharyngeal Phase Signs & Symptoms: Delayed cough Type of cueing: Verbal;Tactile;Visual Amount of cueing: Maximal   GO     Breck Coons Brit Carbonell M.Ed [employer] [phone]  03/03/2012

## 2012-03-03 NOTE — Clinical Social Work Note (Signed)
Clinical Social Worker was continuing to follow for support.  Patient girlfriend feels comfortable at this time with patient transfer to inpatient rehab and then plans to take him home with her following his rehab stay.  Patient and patient girlfriend motivated to return home.  Patient girlfriend to complete [employer] application for patient transportation to and from MD appointments once home.  Patient to transfer to inpatient rehab today - CSW with follow up on rehab.  Clinical Social Worker will sign off for now as social work intervention is no longer needed. Please consult [place] again if new need arises.  Macario Golds, [place] [phone]
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
## 2012-03-03 NOTE — Progress Notes (Signed)
Pt moving often while in bed. Continues to try to get out of bed. Pain medication given to diminish pain from movement.Is still disoriented to place and time. Is having more normal conversations and is less hostile and aggressive to staff.

## 2012-03-03 NOTE — Progress Notes (Signed)
Occupational Therapy Treatment Patient Details Name: Stephen Buckley MRN: [id number] DOB: [birth year] Today's Date: 03/03/2012 Time: 4098-1191 OT Time Calculation (min): 23 min  OT Assessment / Plan / Recommendation Comments on Treatment Session Decreased participation today, most likely due to medicine. Plan is for pt to D/C to CIR.    Follow Up Recommendations  Inpatient Rehab    Barriers to Discharge       Equipment Recommendations  Defer to next venue    Recommendations for Other Services Rehab consult  Frequency Min 2X/week   Plan Discharge plan remains appropriate    Precautions / Restrictions Precautions Precautions: Fall Restrictions RLE Weight Bearing: Non weight bearing   Pertinent Vitals/Pain none    ADL  Lower Body Bathing: +1 Total assistance Upper Body Dressing: Maximal assistance;Performed Where Assessed - Upper Body Dressing: Supine, head of bed up Toileting - Clothing Manipulation and Hygiene: +1 Total assistance Where Assessed - Toileting Clothing Manipulation and Hygiene: Rolling right and/or left;Supine, head of bed flat ADL Comments: Worked with girlfriend with toileting and hygiene after toileting @ bed level.    OT Diagnosis:    OT Problem List:   OT Treatment Interventions:     OT Goals Acute Rehab OT Goals OT Goal Formulation: With family Time For Goal Achievement: 03/13/12 Potential to Achieve Goals: Good ADL Goals Pt Will Perform Grooming: with min assist;Sitting, chair;Supported;with cueing (comment type and amount) ADL Goal: Grooming - Progress: Progressing toward goals Miscellaneous OT Goals Miscellaneous OT Goal #1: Pt will demonstrate sustain attention 30 seconds to demonstrate progress toward [place] recovery level V OT Goal: Miscellaneous Goal #1 - Progress: Progressing toward goals Miscellaneous OT Goal #2: Pt will follow simple command 4 out 5 times during session OT Goal: Miscellaneous Goal #2 - Progress: Progressing  toward goals  Visit Information  Last OT Received On: 03/03/12    Subjective Data      Prior Functioning       Cognition  Overall Cognitive Status: Impaired Area of Impairment: Attention;Memory;Following commands;Safety/judgement;Awareness of errors;Awareness of deficits;Problem solving;Rancho level Arousal/Alertness: Lethargic Orientation Level: Disoriented to;Place;Time;Situation Behavior During Session: Lethargic Current Attention Level: Focused Following Commands: Follows one step commands inconsistently Safety/Judgement: Decreased awareness of safety precautions;Decreased safety judgement for tasks assessed;Impulsive;Decreased awareness of need for assistance Cognition - Other Comments: increased lethargy today. Pt's girlfriend states that it is due to medicine that he was given for agitation and that this is not his "normal" behavior. Dr. Riley Kill aware.     Mobility  Shoulder Instructions Bed Mobility Bed Mobility: Rolling Right;Rolling Left Rolling Right: 1: +1 Total assist Rolling Left: 1: +1 Total assist       Exercises      Balance     End of Session OT - End of Session Activity Tolerance: Treatment limited secondary to medication Patient left: in bed;with call bell/phone within reach;with family/visitor present Nurse Communication: Other (comment) (lethargy due to ?medicine)  GO     Stephen Buckley,HILLARY 03/03/2012, 5:10 PM [facility], OTR/L  [phone] 03/03/2012

## 2012-03-03 NOTE — Anesthesia Postprocedure Evaluation (Signed)
  Anesthesia Post-op Note  Patient: Stephen Buckley  Procedure(s) Performed: Procedure(s) (LRB) with comments: INTRAMEDULLARY (IM) NAIL TIBIAL (Right) IRRIGATION AND DEBRIDEMENT EXTREMITY (Right) INTRAMEDULLARY (IM) RETROGRADE FEMORAL NAILING (Left) APPLICATION OF WOUND VAC (Right) FASCIOTOMY (Right) - start of Procedure:  19:54-End: 20:46 LOWER EXTREMITY ANGIOGRAM (Right) FEMORAL ARTERY EXPLORATION (Right) - Right Femoral Artery Cutdown.    Start of Case:  19:58-End: 20:40  Patient Location: PACU  Anesthesia Type: General  Level of Consciousness: sedated and Patient remains intubated per anesthesia plan  Airway and Oxygen Therapy: Patient remains intubated per anesthesia plan and Patient placed on Ventilator (see vital sign flow sheet for setting)  Post-op Pain: none  Post-op Assessment: Post-op Vital signs reviewed, Patient's Cardiovascular Status Stable, Respiratory Function Stable, Patent Airway, No signs of Nausea or vomiting and Pain level controlled  Post-op Vital Signs: stable  Complications: No apparent anesthesia complications

## 2012-03-03 NOTE — Interval H&P Note (Signed)
Stephen Buckley was admitted today to Inpatient Rehabilitation with the diagnosis of severe TBI/polytrauma.  The patient's history has been reviewed, patient examined, and there is no change in status.  Patient continues to be appropriate for intensive inpatient rehabilitation.  I have reviewed the patient's chart and labs.  Questions were answered to the patient's satisfaction.  SWARTZ,ZACHARY T 03/03/2012, 8:14 PM
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
## 2012-03-04 ENCOUNTER — Inpatient Hospital Stay (HOSPITAL_COMMUNITY): Payer: Medicaid Other | Admitting: *Deleted

## 2012-03-04 ENCOUNTER — Inpatient Hospital Stay (HOSPITAL_COMMUNITY): Payer: Medicaid Other

## 2012-03-04 ENCOUNTER — Inpatient Hospital Stay (HOSPITAL_COMMUNITY): Payer: Medicaid Other | Admitting: Physical Therapy

## 2012-03-04 ENCOUNTER — Inpatient Hospital Stay (HOSPITAL_COMMUNITY): Payer: Medicaid Other | Admitting: Occupational Therapy

## 2012-03-04 DIAGNOSIS — IMO0002 Reserved for concepts with insufficient information to code with codable children: Secondary | ICD-10-CM

## 2012-03-04 DIAGNOSIS — Z5189 Encounter for other specified aftercare: Secondary | ICD-10-CM

## 2012-03-04 DIAGNOSIS — S069XAA Unspecified intracranial injury with loss of consciousness status unknown, initial encounter: Secondary | ICD-10-CM

## 2012-03-04 DIAGNOSIS — S069X9A Unspecified intracranial injury with loss of consciousness of unspecified duration, initial encounter: Secondary | ICD-10-CM

## 2012-03-04 DIAGNOSIS — S82109A Unspecified fracture of upper end of unspecified tibia, initial encounter for closed fracture: Secondary | ICD-10-CM

## 2012-03-04 DIAGNOSIS — S72309A Unspecified fracture of shaft of unspecified femur, initial encounter for closed fracture: Secondary | ICD-10-CM

## 2012-03-04 LAB — COMPREHENSIVE METABOLIC PANEL
ALT: 35 U/L (ref 0–53)
AST: 42 U/L — ABNORMAL HIGH (ref 0–37)
Albumin: 2.9 g/dL — ABNORMAL LOW (ref 3.5–5.2)
Alkaline Phosphatase: 100 U/L (ref 39–117)
BUN: 12 mg/dL (ref 6–23)
CO2: 27 mEq/L (ref 19–32)
Calcium: 9.4 mg/dL (ref 8.4–10.5)
Chloride: 103 mEq/L (ref 96–112)
Creatinine, Ser: 0.66 mg/dL (ref 0.50–1.35)
GFR calc Af Amer: >90 mL/min (ref >90)
GFR calc non Af Amer: >90 mL/min (ref >90)
Glucose, Bld: 107 mg/dL — ABNORMAL HIGH (ref 70–99)
Potassium: 3.5 mEq/L (ref 3.5–5.1)
Sodium: 137 mEq/L (ref 135–145)
Total Bilirubin: 1 mg/dL (ref 0.3–1.2)
Total Protein: 7 g/dL (ref 6.0–8.3)

## 2012-03-04 LAB — URINALYSIS, ROUTINE W REFLEX MICROSCOPIC
Bilirubin Urine: NEGATIVE
Glucose, UA: NEGATIVE mg/dL
Hgb urine dipstick: NEGATIVE
Ketones, ur: NEGATIVE mg/dL
Leukocytes, UA: NEGATIVE
Nitrite: NEGATIVE
Protein, ur: NEGATIVE mg/dL
Specific Gravity, Urine: 1.007 (ref 1.005–1.030)
Urobilinogen, UA: 1 mg/dL (ref 0.0–1.0)
pH: 6.5 (ref 5.0–8.0)

## 2012-03-04 LAB — CBC WITH DIFFERENTIAL/PLATELET
Basophils Absolute: 0.1 10*3/uL (ref 0.0–0.1)
Basophils Relative: 0 % (ref 0–1)
Eosinophils Absolute: 0.2 10*3/uL (ref 0.0–0.7)
Eosinophils Relative: 1 % (ref 0–5)
HCT: 33.8 % — ABNORMAL LOW (ref 39.0–52.0)
Hemoglobin: 11.3 g/dL — ABNORMAL LOW (ref 13.0–17.0)
Lymphocytes Relative: 13 % (ref 12–46)
Lymphs Abs: 2.3 10*3/uL (ref 0.7–4.0)
MCH: 30 pg (ref 26.0–34.0)
MCHC: 33.4 g/dL (ref 30.0–36.0)
MCV: 89.7 fL (ref 78.0–100.0)
Monocytes Absolute: 1.8 10*3/uL — ABNORMAL HIGH (ref 0.1–1.0)
Monocytes Relative: 10 % (ref 3–12)
Neutro Abs: 13.9 10*3/uL — ABNORMAL HIGH (ref 1.7–7.7)
Neutrophils Relative %: 76 % (ref 43–77)
Platelets: 438 10*3/uL — ABNORMAL HIGH (ref 150–400)
RBC: 3.77 MIL/uL — ABNORMAL LOW (ref 4.22–5.81)
RDW: 14.4 % (ref 11.5–15.5)
WBC: 18.2 10*3/uL — ABNORMAL HIGH (ref 4.0–10.5)
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
## 2012-03-04 NOTE — Progress Notes (Signed)
Patient information reviewed and entered into [employer]-PRO system by Hser Belanger, RN, CRRN, PPS Coordinator.  Information including medical coding and functional independence measure will be reviewed and updated through discharge.    

## 2012-03-04 NOTE — Plan of Care (Signed)
Problem: Consults Goal: RH BRAIN INJURY PATIENT EDUCATION Description: See Patient Education module for eduction specifics  Outcome: Progressing Pts girlfriend
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
## 2012-03-04 NOTE — Progress Notes (Signed)
Recreational Therapy Session Note  Patient Details  Name: Jaydyn Bozzo MRN: [id number] Date of Birth: 1951-04-21 Today's Date: 03/04/2012 Time:  930-10  Skilled Therapeutic Interventions/Progress Updates: pt sleeping throughout eval.  Discussed TR role on treatment team and initiated leisure interest assessment with girlfriend.  Pt on HOLD for TR services at this time, will continue to monitor through team for future participation.  Nekayla Heider 03/04/2012, 4:31 PM
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
## 2012-03-05 ENCOUNTER — Inpatient Hospital Stay (HOSPITAL_COMMUNITY): Payer: Medicaid Other | Admitting: Speech Pathology

## 2012-03-05 ENCOUNTER — Inpatient Hospital Stay (HOSPITAL_COMMUNITY): Payer: Medicaid Other

## 2012-03-05 ENCOUNTER — Inpatient Hospital Stay (HOSPITAL_COMMUNITY): Payer: Medicaid Other | Admitting: *Deleted

## 2012-03-05 DIAGNOSIS — S069X9A Unspecified intracranial injury with loss of consciousness of unspecified duration, initial encounter: Secondary | ICD-10-CM

## 2012-03-05 DIAGNOSIS — IMO0002 Reserved for concepts with insufficient information to code with codable children: Secondary | ICD-10-CM

## 2012-03-05 DIAGNOSIS — S72309A Unspecified fracture of shaft of unspecified femur, initial encounter for closed fracture: Secondary | ICD-10-CM

## 2012-03-05 DIAGNOSIS — Z5189 Encounter for other specified aftercare: Secondary | ICD-10-CM

## 2012-03-05 DIAGNOSIS — S82109A Unspecified fracture of upper end of unspecified tibia, initial encounter for closed fracture: Secondary | ICD-10-CM

## 2012-03-05 LAB — CBC
HCT: 33.1 % — ABNORMAL LOW (ref 39.0–52.0)
MCH: 29.8 pg (ref 26.0–34.0)
MCV: 88.7 fL (ref 78.0–100.0)
RBC: 3.73 MIL/uL — ABNORMAL LOW (ref 4.22–5.81)
RDW: 14.5 % (ref 11.5–15.5)
WBC: 17.4 10*3/uL — ABNORMAL HIGH (ref 4.0–10.5)

## 2012-03-05 LAB — URINE CULTURE
Colony Count: NO GROWTH
Culture: NO GROWTH

## 2012-03-05 MED ORDER — NICOTINE 21 MG/24HR TD PT24
21.0000 mg | MEDICATED_PATCH | Freq: Every day | TRANSDERMAL | Status: DC
  Administered 2012-03-05 – 2012-03-07 (×3): 21 mg via TRANSDERMAL
  Filled 2012-03-05 (×5): qty 1
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
## 2012-03-05 NOTE — Progress Notes (Signed)
Speech Language Pathology Daily Session Note  Patient Details  Name: Miguel Christiana MRN: [id number] Date of Birth: 09-01-1950  Today's Date: 03/05/2012 Time: 1415-1510 Time Calculation (min): 55 min  Short Term Goals: Week 1: SLP Short Term Goal 1 (Week 1): Patient will sustain attention to basic, familiar task for 1 minute increments with mod assist verbal and visual cues. SLP Short Term Goal 2 (Week 1): Patient will utilize external aids to assist with orientation with max assist multi modal cues. SLP Short Term Goal 3 (Week 1): Patient will consume Dys.2 textures and nectar-thick liquids without observed s/s of aspiration and max assist to carryover safe swallow strategies. SLP Short Term Goal 4 (Week 1): Patient will consume trials of thin liquid via cup with mod assist cues and no overt s/s of aspiration.  Skilled Therapeutic Interventions: Treatment focus on attention, initiation, orientation and participation in tasks. Pt asleep in recliner and required Max questions to engage in conversation. Pt required Total A verbal cues to orient to place and situation and demonstrated language of confusion throughout session. Pt required total A to demonstrate focus attention to color matching task from field of two.  Pt able to name 100% of colors but unable to functionally match the colors due to attention. Pt also independently able to add numbers from two cards X 1.    FIM:  Comprehension Comprehension Mode: Auditory Comprehension: 1-Understands basic less than 25% of the time/requires cueing 75% of the time Expression Expression Mode: Verbal Expression: 1-Expresses basis less than 25% of the time/requires cueing greater than 75% of the time. Social Interaction Social Interaction: 1-Interacts appropriately less than 25% of the time. May be withdrawn or combative. Problem Solving Problem Solving: 1-Solves basic less than 25% of the time - needs direction nearly all the time or does not  effectively solve problems and may need a restraint for safety Memory Memory: 1-Recognizes or recalls less than 25% of the time/requires cueing greater than 75% of the time  Pain Pain Assessment Pain Assessment: No/denies pain  Therapy/Group: Individual Therapy  Heily Carlucci 03/05/2012, 4:55 PM

## 2012-03-05 NOTE — Progress Notes (Signed)
Physical Therapy Session Note  Patient Details  Name: Stephen Buckley MRN: [id number] Date of Birth: 07/14/1950  Today's Date: 03/05/2012 Time: 8:32-9:30 ( )    Short Term Goals: Week 1:  PT Short Term Goal 1 (Week 1): Pt will sit EOB for 2 min with min assist.  PT Short Term Goal 2 (Week 1): Pt will perform sliding board transfer with +2 total assist pt = 40%.  PT Short Term Goal 3 (Week 1): Pt will propell wheelchair x 20' with min assist. PT Short Term Goal 4 (Week 1): Pt will demonstrate focused attention to task >/= 30 sec.   Skilled Therapeutic Interventions/Progress Updates:  Tx focused on safety, attention and initiation with functional mobility. Pt with no c/o pain today. Girlfriend present for part of session, helpful with having pt follow instructions. She reports attention is much worse after he got pain meds. Pt very verbose, but not appropriately or in context of situation. Pt unable to attend to functional tasks >5% of the time, needing manual cues and physical assist to initiate movement. Verbal, auditory, and visual cues not successful at this time. Pt inappropriately trying to touch/kiss therapist today.  Pt supine upon arrival. Performed bed mobility rolling R/L x8 for changing briefs, donning shorts, and later getting on/off bed pan. Attempted to have pt wash self for peri-cares, but unable to attend to task.  Helped pt problem solve through donning shorts, but Total A due to inability to attend, follow instructions or initiate movement.  Supine<>sit with Total A x2. Pt wanting to lay back down for bedpan after sitting unsupported EOB x82min, but unable to assist with LEs or trunk. Rather than laying towards [facility], pt attempting to stand and reach to foot of bed.  Sitting unsupported for postural control with cognitive tasks focusing on orientation in room and to self. Pt continued to try to stand from bed, needing physical assist to remain sitting. Pt wanting to walk to  bathroom.  Sliding board bed>recliner +2 total A. Pt unable to assist with cues for hand placement and weight-shifting.  Nursing reports pt better in recliner at this time due to tries to bear weight in WC.  Pt left with girlfriend and lap belt in place with LEs elevated.      Therapy Documentation Precautions:  Precautions Precautions: Fall Restrictions Weight Bearing Restrictions: Yes RLE Weight Bearing: Non weight bearing LLE Weight Bearing: Non weight bearing  See FIM for current functional status  Therapy/Group: Individual Therapy  Virl Cagey, PT 03/05/2012, 9:22 AM

## 2012-03-05 NOTE — Progress Notes (Signed)
Pt restless at times,usually related to toileting needs, pain; Increased restlessness, mild agitation late afternoon.  Ativan 0.5mg  given po x2 ; does NOT make pt drowsy , is more cooperative. Incontinent stool, incontinent urine x3,continent x2; occasionally verbalizes need if staff in room.  Initiate timed tilting(urinal).   Pain controlled with 2 Vicodin, x2 today. Carlean Purl

## 2012-03-05 NOTE — Progress Notes (Signed)
Occupational Therapy Session Note  Patient Details  Name: Stephen Buckley MRN: [id number] Date of Birth: 12/24/1950  Today's Date: 03/05/2012 Time: 4098-1191 Time Calculation (min):  Short Term Goals: Week 1:  OT Short Term Goal 1 (Week 1): Patient will sustain attention for 5 min during a self feeding or grooming task with min cueing OT Short Term Goal 2 (Week 1): Patient will assist with sliding board transfer to drop arm commode with max assist OT Short Term Goal 3 (Week 1): Patient will indicate personal need to staff or girlfriend 50% time, e.g. toileting, temperature, hunger OT Short Term Goal 4 (Week 1): Patient will dress upper body with minimal assistance OT Short Term Goal 5 (Week 1): Patient will dress lower body with maximal assistance  Skilled Therapeutic Interventions/Progress Updates:    Pt sitting in recliner with significant other at side.  Pt very restless and fidgeting with quick release belt but greeted therapist appropriately.  Pt continued to exhibit restlessness throughout session and required max verbal cues to engage in conversation.  Pt continually gazed out window and on numerous occasions began reaching towards window.  Pt's legs restless throughout session and patient moved RLE onto floor numerous times with pain.  Therapist explained that legs should remain on recliner and it would not hurt.  Pt verbalized understanding but would continue to remove leg from recliner.  Attempted sliding board transfer chair<>bed.  Pt required tot A for transfers and did not demonstrate understanding of technique/strategy, often resisting assistance from therapist.  Pt was unable to name location, date, or incident resulting in hospitalization from field of 2.  Pt could state date of birth and could also pick correct name of street of residence from field of 2.  Pt demonstrated language of confusion throughout session and continually searched through pockets looking for matches  even though therapist explained that no smoking was allowed.  Therapy Documentation Precautions:  Precautions Precautions: Fall Restrictions Weight Bearing Restrictions: Yes RLE Weight Bearing: Non weight bearing LLE Weight Bearing: Non weight bearing  See FIM for current functional status  Therapy/Group: Individual Therapy  Rich Brave 03/05/2012, 3:07 PM

## 2012-03-06 ENCOUNTER — Inpatient Hospital Stay (HOSPITAL_COMMUNITY): Payer: Medicaid Other | Admitting: Physical Therapy

## 2012-03-06 ENCOUNTER — Inpatient Hospital Stay (HOSPITAL_COMMUNITY): Payer: Medicaid Other | Admitting: Speech Pathology

## 2012-03-06 ENCOUNTER — Inpatient Hospital Stay (HOSPITAL_COMMUNITY): Payer: Medicaid Other
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
## 2012-03-06 NOTE — Progress Notes (Signed)
[facility] Individual Statement of Services  Patient Name:  Stephen Buckley  Date:  03/06/2012  Welcome to the [facility].  Our goal is to provide you with an individualized program based on your diagnosis and situation, designed to meet your specific needs.  With this comprehensive rehabilitation program, you will be expected to participate in at least 3 hours of rehabilitation therapies Monday-Friday, with modified therapy programming on the weekends.  Your rehabilitation program will include the following services:  Physical Therapy (PT), Occupational Therapy (OT), Speech Therapy (ST), 24 hour per day rehabilitation nursing, Therapeutic Recreaction (TR), Neuropsychology, Case Management (Social Worker), Rehabilitation Medicine, Nutrition Services and Pharmacy Services  Weekly team conferences will be held on Tuesdays to discuss your progress.  Your  Social Worker will talk with you frequently to get your input and to update you on team discussions.  Team conferences with you and your family in attendance may also be held.  Expected length of stay: 3 weeks  Overall anticipated outcome: minimal assistance w/c level  Depending on your progress and recovery, your program may change.  Your  Social Worker will coordinate services and will keep you informed of any changes.  Your  Social Worker's name and contact numbers are listed  below.  The following services may also be recommended but are not provided by the [facility]:   Driving Evaluations  Home Health Rehabiltiation Services  Outpatient Rehabilitatation [facility]  Vocational Rehabilitation   Arrangements will be made to provide these services after discharge if needed.  Arrangements include referral to agencies that provide these services.  Your insurance has been verified to be:  Medicaid application pending Your primary doctor is:  None currently (was followed at  [facility])  Pertinent information will be shared with your doctor and your insurance company.  Social Worker:  [place], [place] [phone] or [phone]  Information discussed with and copy given to patient by: [name], 03/06/2012, 4:24 PM

## 2012-03-06 NOTE — Progress Notes (Signed)
Social Work Patient ID: [name], male   DOB: 02-18-1951, 60 y.o.   MRN: [id number]  Have reviewed team conference with pt's girlfriend (pt sleeping and would not awaken) - she is aware and agreeable with targeted d/c of 03/21/12 at minimal assist w/c level goals.  Continues to stay at hospital with patient, but with very basic understanding of TBI issues.  Need ongoing education.  Will continue to follow.  Jsiah Menta

## 2012-03-06 NOTE — Progress Notes (Signed)
Occupational Therapy Session Note  Patient Details  Name: Stephen Buckley MRN: [id number] Date of Birth: Sep 15, 1950  Today's Date: 03/06/2012 Time: 1000-1100 Time Calculation (min): 60 min  Short Term Goals: Week 1:  OT Short Term Goal 1 (Week 1): Patient will sustain attention for 5 min during a self feeding or grooming task with min cueing OT Short Term Goal 2 (Week 1): Patient will assist with sliding board transfer to drop arm commode with max assist OT Short Term Goal 3 (Week 1): Patient will indicate personal need to staff or girlfriend 50% time, e.g. toileting, temperature, hunger OT Short Term Goal 4 (Week 1): Patient will dress upper body with minimal assistance OT Short Term Goal 5 (Week 1): Patient will dress lower body with maximal assistance  Skilled Therapeutic Interventions/Progress Updates:    Focus on attention to functional task, orientation, and safety awareness.  Attempted to engage in sliding board transfer recliner>bed>BSC>bed>recliner.  Pt unable to sustain attention to actively engage and required tot A.  Pt continually attempted to remove sliding board after placement. Pt required tot A for transfer.  Pt stated he needed to use bathroom but sat on [facility] for 10 mins with no results.  Pt continually distracted by items in room and on floor.  When patient in bed for LB dressing pt did not actively participate in rolling to assist with pulling up pants and changing brief.  Pt was unable to accurately read date on calendar or clock.  Conversation was tangental and required max verbal cues for redirection.  Therapy Documentation Precautions:  Precautions Precautions: Fall Restrictions Weight Bearing Restrictions: Yes RLE Weight Bearing: Non weight bearing LLE Weight Bearing: Non weight bearing   Pain: Pain Assessment Pain Assessment: No/denies pain  See FIM for current functional status  Therapy/Group: Individual Therapy  Rich Brave 03/06/2012,  11:11 AM
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
## 2012-03-06 NOTE — Progress Notes (Signed)
Speech Language Pathology Daily Session Note  Patient Details  Name: Stephen Buckley MRN: [id number] Date of Birth: 1950-12-30  Today's Date: 03/06/2012 Time: 1400-1455 Time Calculation (min): 55 min  Short Term Goals: Week 1: SLP Short Term Goal 1 (Week 1): Patient will sustain attention to basic, familiar task for 1 minute increments with mod assist verbal and visual cues. SLP Short Term Goal 2 (Week 1): Patient will utilize external aids to assist with orientation with max assist multi modal cues. SLP Short Term Goal 3 (Week 1): Patient will consume Dys.2 textures and nectar-thick liquids without observed s/s of aspiration and max assist to carryover safe swallow strategies. SLP Short Term Goal 4 (Week 1): Patient will consume trials of thin liquid via cup with mod assist cues and no overt s/s of aspiration.  Skilled Therapeutic Interventions: t asleep in bed and required Max verbal and tactile cues for arousal to participate in session. Pt required total A to attend to any task for more than ~30 seconds.  Pt required total A cueing for orientation to place and situation and was able to read visual cue with Max A visual and verbal cues.  Pt demonstrated language of confusion but had increased appropriate social responses with 50% of interactions. Pt's girlfriend present and asking appropriate questions in regards to his current cognitive function, SLP goals and expected progress.    FIM:  Comprehension Comprehension Mode: Auditory Comprehension: 1-Understands basic less than 25% of the time/requires cueing 75% of the time Expression Expression Mode: Verbal Expression: 1-Expresses basis less than 25% of the time/requires cueing greater than 75% of the time. Social Interaction Social Interaction: 1-Interacts appropriately less than 25% of the time. May be withdrawn or combative. Problem Solving Problem Solving: 1-Solves basic less than 25% of the time - needs direction nearly all the  time or does not effectively solve problems and may need a restraint for safety Memory Memory: 1-Recognizes or recalls less than 25% of the time/requires cueing greater than 75% of the time  Pain Pain Assessment Pain Assessment: No/denies pain  Therapy/Group: Individual Therapy  Janeah Kovacich 03/06/2012, 4:49 PM

## 2012-03-06 NOTE — Progress Notes (Signed)
Physical Therapy Note  Patient Details  Name: Hursel Mcnell MRN: [id number] Date of Birth: 13-Sep-1950 Today's Date: 03/06/2012  Time: 900-955 55 minutes  Pt c/o pain with rolling, RN made aware.  Bed mobility rolling with max A due to inattention, poor motor planning.  Sliding board transfers bed <> w/c with +2 assist for safety, total A to prevent patient from standing.  W/c mobility throughout unit with supervision for safety, max cuing for attention, steering in tight spaces.  Attention and awareness activities with max A for focused/sustained attention in quiet environment.  Attempt problem solving task with total assist.  Card game with matching and "war" car game with pt correct 100% of time,needs cues for focused attention to task throughout.  Pt requires total A throughout session to maintain NWB in LEs.  Individual therapy   Seiji Wiswell 03/06/2012, 9:56 AM
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
## 2012-03-07 ENCOUNTER — Inpatient Hospital Stay (HOSPITAL_COMMUNITY): Payer: Medicaid Other | Admitting: Speech Pathology

## 2012-03-07 ENCOUNTER — Inpatient Hospital Stay (HOSPITAL_COMMUNITY): Payer: Medicaid Other

## 2012-03-07 ENCOUNTER — Inpatient Hospital Stay (HOSPITAL_COMMUNITY): Payer: Medicaid Other | Admitting: Physical Therapy

## 2012-03-07 DIAGNOSIS — S72309A Unspecified fracture of shaft of unspecified femur, initial encounter for closed fracture: Secondary | ICD-10-CM

## 2012-03-07 DIAGNOSIS — S82109A Unspecified fracture of upper end of unspecified tibia, initial encounter for closed fracture: Secondary | ICD-10-CM

## 2012-03-07 DIAGNOSIS — S069X9A Unspecified intracranial injury with loss of consciousness of unspecified duration, initial encounter: Secondary | ICD-10-CM

## 2012-03-07 DIAGNOSIS — IMO0002 Reserved for concepts with insufficient information to code with codable children: Secondary | ICD-10-CM

## 2012-03-07 DIAGNOSIS — Z5189 Encounter for other specified aftercare: Secondary | ICD-10-CM

## 2012-03-07 MED ORDER — ENSURE PUDDING PO PUDG
1.0000 | Freq: Four times a day (QID) | ORAL | Status: DC
  Administered 2012-03-08 – 2012-03-27 (×64): 1 via ORAL

## 2012-03-07 NOTE — Progress Notes (Signed)
Speech Language Pathology Daily Session Note  Patient Details  Name: Stephen Buckley MRN: [id number] Date of Birth: 1950-10-06  Today's Date: 03/07/2012 Time: 1045-1130 Time Calculation (min): 45 min  Short Term Goals: Week 1: SLP Short Term Goal 1 (Week 1): Patient will sustain attention to basic, familiar task for 1 minute increments with mod assist verbal and visual cues. SLP Short Term Goal 2 (Week 1): Patient will utilize external aids to assist with orientation with max assist multi modal cues. SLP Short Term Goal 3 (Week 1): Patient will consume Dys.2 textures and nectar-thick liquids without observed s/s of aspiration and max assist to carryover safe swallow strategies. SLP Short Term Goal 4 (Week 1): Patient will consume trials of thin liquid via cup with mod assist cues and no overt s/s of aspiration.  Skilled Therapeutic Interventions: Treatment focus on sustained attention and participation in a functional task. Pt sustained attention to color matching task for ~1 minute X 3 attempts with 100% accuracy for matching colors. Pt perseverative on needing to use the bathroom and required total  X 2 for transfer to [facility]. Pt unable to attend to task and was verbally agitated throughout. Pt transferred back to recliner.    FIM:  Comprehension Comprehension Mode: Auditory Comprehension: 1-Understands basic less than 25% of the time/requires cueing 75% of the time Expression Expression Mode: Verbal Expression: 1-Expresses basis less than 25% of the time/requires cueing greater than 75% of the time. Social Interaction Social Interaction: 1-Interacts appropriately less than 25% of the time. May be withdrawn or combative. Problem Solving Problem Solving: 1-Solves basic less than 25% of the time - needs direction nearly all the time or does not effectively solve problems and may need a restraint for safety Memory Memory: 1-Recognizes or recalls less than 25% of the time/requires cueing  greater than 75% of the time  Pain Pain Assessment Pain Assessment: No/denies pain  Therapy/Group: Individual Therapy  [name] 03/07/2012, 3:45 PM
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
## 2012-03-07 NOTE — Progress Notes (Signed)
Physical Therapy Note  Patient Details  Name: Stephen Buckley MRN: [id number] Date of Birth: [birth year] Today's Date: 03/07/2012  Time: 1330-1418 48 minutes  No c/o pain.  Treatment session focused on engaging pt in meaningful tasks for attention, awareness and safety.  Pt engaged in ring toss game x 10 minutes with mod cues for attention initially, total cues needed as pt became more fatigued.  Able to perform mental math to add score of up to 3 rings correctly x 3 attempts.  Money counting with pt able to correctly count change up to $2.00, unable to follow cues to sort change or to find different denominations, limited by poor attention.  Pt reports he needs to use restroom.  Attempt sliding board transfer to [facility], pt becoming unsafe, agitated, repeatedly pulling sliding board out from underneath him and trying to stand.  Pt then states "I will just walk over to the bed", pt becoming more agitated as he is reminded he cannot walk.  Pt assisted with +2 back to bed with sliding board due to increased agitation and unsafe behaviors.  Attempt to engage pt in cognitive card game once in bed, pt continued to escalate, pt then left in quiet room with lights off with bed alarm set, 3 rails up and caregiver present.  Caregiver expresses understanding of need for quiet time when pt is agitated.  Individual therapy   Bunny Kleist 03/07/2012, 2:18 PM

## 2012-03-08 ENCOUNTER — Inpatient Hospital Stay (HOSPITAL_COMMUNITY): Payer: Medicaid Other | Admitting: Physical Therapy

## 2012-03-08 ENCOUNTER — Inpatient Hospital Stay (HOSPITAL_COMMUNITY): Payer: Medicaid Other

## 2012-03-08 ENCOUNTER — Inpatient Hospital Stay (HOSPITAL_COMMUNITY): Payer: Medicaid Other | Admitting: Speech Pathology

## 2012-03-08 ENCOUNTER — Inpatient Hospital Stay (HOSPITAL_COMMUNITY): Payer: Medicaid Other | Admitting: Occupational Therapy

## 2012-03-08 NOTE — Progress Notes (Signed)
POD 16 s/p IMN, fasciotomies R tib-fib and L femur IMN  Confrontational but following commands, pain well controlled LE: R&L knees with 0-110 plus, stable v/v knee exam, excellent ankle motion, mild to minimal edema  A/P: continue NWB bilat; huge risk for noncompliance  Will repeat films as now two weeks  Appreciate outstanding job by team to manage his hostile disposition while furthering his rehab progress  D/c planning  Myrene Galas, MD [facility] [phone] (p)

## 2012-03-08 NOTE — Progress Notes (Addendum)
Occupational Therapy Session Note  Patient Details  Name: Stephen Buckley MRN: [id number] Date of Birth: 02-19-1951  Today's Date: 03/08/2012 Time: 0815-0900 Time Calculation (min): 45 min   Skilled Therapeutic Interventions/Progress Updates: Patient scheduled for ADL.  Upon arrival patient sleeping and lethargic.  Then he sat up in bed (with Mod A) and placed a towel from the bottom of his bed to cover his periarea and voided.  Though patient not able to attend for initial b/dressing attempts, after completing sliding board transfer with Total A x2, he was able to focus and  follow two spaced out short one step commands to (1) "Slide over into your chair." and (2) "Put on your shirt."  Patient with poor safety awareness and continously attempted to get out bed once awake until he was assisted into the w/c.   W/c to/fr toilet transfer via slide board (Total A x2).   Patient was non combative, spoke several compliments to this clinician and attempted to kiss this clinician on the face or lips x 4.        Therapy Documentation Precautions:  Precautions Precautions: Fall Restrictions Weight Bearing Restrictions: Yes RLE Weight Bearing: Non weight bearing LLE Weight Bearing: Non weight bearing   Pain: stated,"Oh" x 3 as he attempted to stand or squat up though he has NWB bilateral LEs orders   See FIM for current functional status  Therapy/Group: Individual Therapy  Bud Face [facility] 03/08/2012, 2:52 PM
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
## 2012-03-08 NOTE — Progress Notes (Signed)
Orthopedic Tech Progress Note Patient Details:  Stephen Buckley 08-Jan-1951 [id number]  Patient ID: Stephen Buckley, male   DOB: 10-01-1950, 61 y.o.   MRN: [id number] Confirmed PRAFO boot is in room with patient. Completed order.    Stephen Buckley T 03/08/2012, 11:14 AM

## 2012-03-08 NOTE — Progress Notes (Addendum)
Physical Therapy Session Note  Patient Details  Name: Stephen Buckley MRN: [id number] Date of Birth: 07-09-1950  Today's Date: 03/08/2012 Time:  - [phone] (60 minutes) individual Pain: no complaint of pain/ no distress noted  Orientation: person only but states he hurt his legs  Short Term Goals: Week 1:  PT Short Term Goal 1 (Week 1): Pt will sit EOB for 2 min with min assist.  PT Short Term Goal 2 (Week 1): Pt will perform sliding board transfer with +2 total assist pt = 40%.  PT Short Term Goal 3 (Week 1): Pt will propell wheelchair x 20' with min assist. PT Short Term Goal 4 (Week 1): Pt will demonstrate focused attention to task >/= 30 sec.   Skilled Therapeutic Interventions/Progress Updates: Therapeutic activities/exercises to increase sustained attention; wc mobility training; transfer training    Therapy Documentation Precautions:  Precautions Precautions: Fall Restrictions Weight Bearing Restrictions: Yes RLE Weight Bearing: Non weight bearing LLE Weight Bearing: Non weight bearing    Vital Signs: Therapy Vitals Temp: 99 F (37.2 C) Temp src: Oral Pulse Rate: 87  Resp: 18  BP: 135/55 mmHg Patient Position, if appropriate: Lying Oxygen Therapy SpO2: 99 % O2 Device: None (Room air) Pain: Pain Assessment Pain Assessment: Faces Pain Score: Asleep Faces Pain Scale: Hurts even more (pain med "would be a good idea") Pain Type: Acute pain Pain Location: Generalized (doesnt state where) Pain Intervention(s): Medication (See eMAR) Mobility: Transfers wc >< toilet using sliding board (with OT) setup + max assist to toilet / min/mod from toilet with improved use of UEs to boost   Locomotion :wc mobility- pt propels on level surfaces approximately 50 -75  feet before stopping  requiring vcs to continue        Exercises: UE ergonometer Level 1 - pt attends to task < 20 to 30 seconds before internal distraction   Other Treatments:  Playing card identification-  pt could identify the correct number and suite of each card but could not follow verbal instructions to place each card in correct suite.  1345-1425 (40 minutes) individual Pain: no reported pain/no distress noted Focus of treatment: transfer training with sliding board Treatment: pt up in wc; wc mobility- 40-50 feet SBA with max vcs to continue; attempted sliding board transfer wc > mat NWB bilateral LEs- pt attempted to stand and refused to use sliding board becoming agitated. Pt states he wants to use bathroom . Returned to room - refused to use bathroom stating that he did not need bathroom . Pt propelled around dayroom with SBA. Returned to dayroom with quick release belt in place.    See FIM for current functional status  Therapy/Group: Individual Therapy  Dhanvi Boesen,JIM 03/08/2012, 8:08 AM

## 2012-03-08 NOTE — Progress Notes (Signed)
Speech Language Pathology Daily Session Note  Patient Details  Name: Stephen Buckley MRN: [id number] Date of Birth: Sep 12, 1950  Today's Date: 03/08/2012 Time: 1010-1035 Time Calculation (min): 25 min  Short Term Goals: Week 1: SLP Short Term Goal 1 (Week 1): Patient will sustain attention to basic, familiar task for 1 minute increments with mod assist verbal and visual cues. SLP Short Term Goal 2 (Week 1): Patient will utilize external aids to assist with orientation with max assist multi modal cues. SLP Short Term Goal 3 (Week 1): Patient will consume Dys.2 textures and nectar-thick liquids without observed s/s of aspiration and max assist to carryover safe swallow strategies. SLP Short Term Goal 4 (Week 1): Patient will consume trials of thin liquid via cup with mod assist cues and no overt s/s of aspiration.  Skilled Therapeutic Interventions: Treatment focus on sustained attention and participation in a functional task. Patient sustained attention to sorting task for 1-2 minutes X 3 attempts with 98% accuracy for sorting given two variables with initial demonstration cues. Patient perseverated on needing to use the bathroom and required mod-max assist to redirect into functional tasks.    FIM:  Comprehension Comprehension Mode: Auditory Comprehension: 1-Understands basic less than 25% of the time/requires cueing 75% of the time Expression Expression Mode: Verbal Expression: 1-Expresses basis less than 25% of the time/requires cueing greater than 75% of the time. Social Interaction Social Interaction: 1-Interacts appropriately less than 25% of the time. May be withdrawn or combative. Problem Solving Problem Solving: 1-Solves basic less than 25% of the time - needs direction nearly all the time or does not effectively solve problems and may need a restraint for safety Memory Memory: 1-Recognizes or recalls less than 25% of the time/requires cueing greater than 75% of the time FIM -  Eating Eating Activity: 4: Helper occasionally brings food to mouth  Pain Pain Assessment Pain Assessment: No/denies pain  Therapy/Group: Individual Therapy  Charlane Ferretti., CCC-SLP [phone]  Stephen Buckley 03/08/2012, 3:25 PM

## 2012-03-09 ENCOUNTER — Inpatient Hospital Stay (HOSPITAL_COMMUNITY): Payer: Medicaid Other | Admitting: Physical Therapy

## 2012-03-09 NOTE — Progress Notes (Signed)
Physical Therapy Note  Patient Details  Name: Stephen Buckley MRN: [id number] Date of Birth: 17-Dec-1950 Today's Date: 03/09/2012  0930-1000 (30 minutes) individual Pain: no reported pain Focus of treatment: Therapeutic activities focused on increased sustained/focused attention Treatment: Pt in bed; dressing- assisted donning shorts in supine; pt partially rolled from side to side to complete donning shorts with minimal cues; shirt- pt given T-shirt , pt donned with increased time; supine to sit SBA; transfers- therapist placed sliding board under patient, pt refused to follow instructions and attempted to transfer unsafely; transfer required mod physical assist to safely use sliding board for transfer; wc mobility- pt propels for short distances and stops (50 feet) requiring vcs to continue this task; pt participated in playing Dominoe game. Pt able to match pieces but unable to follow instructions for game. Pt left in wc / quick release belt in place with mother in room.   Candy Ziegler,JIM 03/09/2012, 7:49 AM
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
## 2012-03-10 ENCOUNTER — Inpatient Hospital Stay (HOSPITAL_COMMUNITY): Payer: Medicaid Other

## 2012-03-10 ENCOUNTER — Inpatient Hospital Stay (HOSPITAL_COMMUNITY): Payer: Medicaid Other | Admitting: Physical Therapy

## 2012-03-10 ENCOUNTER — Inpatient Hospital Stay (HOSPITAL_COMMUNITY): Payer: Medicaid Other | Admitting: Speech Pathology

## 2012-03-10 MED ORDER — MEGESTROL ACETATE 400 MG/10ML PO SUSP
400.0000 mg | Freq: Two times a day (BID) | ORAL | Status: DC
  Administered 2012-03-10 – 2012-03-13 (×6): 400 mg via ORAL
  Filled 2012-03-10 (×11): qty 10
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
## 2012-03-10 NOTE — Progress Notes (Signed)
Occupational Therapy Note  Patient Details  Name: Trey Gulbranson MRN: [id number] Date of Birth: 07/27/1950 Today's Date: 03/10/2012  Time: 1300-1330 Pt did not indicate pain Individual Therapy Pt in bed covered up and required max verbal/tactile cues and encouragement to actively engage in therapy session.  Pt was tot A for supine>sit EOB and then attempted to lay back down X 4.  Pt successful in laying down while therapist pulled w/c closer to bed and again required tot A for sit EOB.  Patient did not attend to therapist while explaining and demonstrating use of sliding board to get into w/c.  Pt continued to remove sliding board after placement X 5.  Pt finally assisted with transfer (max A).  In w/c pt attempted to stand X 4 before therapist could place quick release belt.  Pt rolled w/c approx 10' before being distracted with environment.  Pt required max verbal cues for redirection.  Pt did not attend to directions from therapist and aimlessly rolled w/c in hallway.   [name] [facility] 03/10/2012, 2:51 PM

## 2012-03-10 NOTE — Progress Notes (Signed)
Occupational Therapy Session Note  Patient Details  Name: Stephen Buckley MRN: [id number] Date of Birth: Jan 11, 1951  Today's Date: 03/10/2012 Time: 0900-1000 Time Calculation (min): 60 min  Short Term Goals: Week 1:  OT Short Term Goal 1 (Week 1): Patient will sustain attention for 5 min during a self feeding or grooming task with min cueing OT Short Term Goal 2 (Week 1): Patient will assist with sliding board transfer to drop arm commode with max assist OT Short Term Goal 3 (Week 1): Patient will indicate personal need to staff or girlfriend 50% time, e.g. toileting, temperature, hunger OT Short Term Goal 4 (Week 1): Patient will dress upper body with minimal assistance OT Short Term Goal 5 (Week 1): Patient will dress lower body with maximal assistance  Skilled Therapeutic Interventions/Progress Updates:    Pt in bed upon arrival.  Pt required max verbal and tactile cues to engage in sitting EOB in preparation for transfer to w/c and grooming tasks at sink.  Pt required tot A for sliding board transfer to w/c.  Once in w/c pt stated he need to use BSC.  Tot A sliding board transfers to drop arm BSC.  Tot A + 2 for toileting.  Pt attempted to clean self after bowel movement and got feces on hand with no awareness.  Pt required max verbal cues to use wash cloth to clean hands.  Pt positioned in front of sink and presented with supplies for shaving.  Pt would not engage in shaving and required tot A for task.  Pt continued to talk throughout session and required max verbal cues to stop when shaving (for safety purposes.)  Pt did not attend to any funcitonal tasks this morning requiring tot A/max A to attend to questions from therapist.  Pt would respond appropriately approx 50% of time.  Focus on attention to task and engagement in functional tasks.  Therapy Documentation Precautions:  Precautions Precautions: Fall Restrictions Weight Bearing Restrictions: Yes RLE Weight Bearing: Non weight  bearing LLE Weight Bearing: Non weight bearing   Pain: Pain Assessment Pain Assessment: No/denies pain  See FIM for current functional status  Therapy/Group: Individual Therapy  Rich Brave 03/10/2012, 10:03 AM

## 2012-03-10 NOTE — Progress Notes (Signed)
Speech Language Pathology Daily Session Note  Patient Details  Name: Stephen Buckley MRN: [id number] Date of Birth: 02-23-1951  Today's Date: 03/10/2012 Time: 4098-1191 Time Calculation (min): 45 min  Short Term Goals: Week 1: SLP Short Term Goal 1 (Week 1): Patient will sustain attention to basic, familiar task for 1 minute increments with mod assist verbal and visual cues. SLP Short Term Goal 2 (Week 1): Patient will utilize external aids to assist with orientation with max assist multi modal cues. SLP Short Term Goal 3 (Week 1): Patient will consume Dys.2 textures and nectar-thick liquids without observed s/s of aspiration and max assist to carryover safe swallow strategies. SLP Short Term Goal 4 (Week 1): Patient will consume trials of thin liquid via cup with mod assist cues and no overt s/s of aspiration.  Skilled Therapeutic Interventions: Treatment focus on sustained attention and trials of thin liquids. Pt demonstrated sustained attention to nonfunctional task (reading numbers on phone) for ~ 5 minutes and was unable to be redirected. Pt also demonstrated attention to nonfunctional conversation with language of confusion for ~5 minutes without ability to redirect.  Pt administered trials of thin intermittently throughout session via cup with cough X 1 out of 8 oz, suspect due to large, multiple sips.  Clinician will continue trials of thin liquids with textures to assess readiness for possible upgrade.    FIM:  Comprehension Comprehension Mode: Auditory Comprehension: 1-Understands basic less than 25% of the time/requires cueing 75% of the time Expression Expression Mode: Verbal Expression: 1-Expresses basis less than 25% of the time/requires cueing greater than 75% of the time. Social Interaction Social Interaction: 1-Interacts appropriately less than 25% of the time. May be withdrawn or combative. Problem Solving Problem Solving: 1-Solves basic less than 25% of the time -  needs direction nearly all the time or does not effectively solve problems and may need a restraint for safety Memory Memory: 1-Recognizes or recalls less than 25% of the time/requires cueing greater than 75% of the time  Pain Pain Assessment Pain Assessment: No/denies pain  Therapy/Group: Individual Therapy  Nuno Brubacher 03/10/2012, 5:16 PM

## 2012-03-10 NOTE — Plan of Care (Signed)
Problem: Consults Goal: RH BRAIN INJURY PATIENT EDUCATION Description: See Patient Education module for eduction specifics  Outcome: Progressing Involve family, girlfriend

## 2012-03-10 NOTE — Progress Notes (Signed)
Physical Therapy Note  Patient Details  Name: Mattia Liford MRN: [id number] Date of Birth: 01-10-1951 Today's Date: 03/10/2012  Time: 1330-1430 60 minutes  No c/o pain.  Treatment focused on focused attention and participation in functional tasks.  Pt requires max-total cues for participation in all activities this session.  Unable to focus attention > 5 seconds to attempt car transfer, BSC transfer, cognitive game.  Pt perseverating on need to use the restroom.  Total A transfer to [facility], pt with episode of incontinence of urine.  Pt unable to follow cuing for clothing management or hygiene, becoming increasingly aggitated.  Required +2 assist to safely perform  Hygiene and transfer off of BSC.  Pt able to propel w/c when he chooses to, unable to follow directions for w/c mobility.  Individual therapy   Leahanna Buser 03/10/2012, 2:28 PM

## 2012-03-11 ENCOUNTER — Inpatient Hospital Stay (HOSPITAL_COMMUNITY): Payer: Medicaid Other | Admitting: Speech Pathology

## 2012-03-11 ENCOUNTER — Inpatient Hospital Stay (HOSPITAL_COMMUNITY): Payer: Medicaid Other | Admitting: Physical Therapy

## 2012-03-11 ENCOUNTER — Inpatient Hospital Stay (HOSPITAL_COMMUNITY): Payer: Medicaid Other

## 2012-03-11 LAB — BASIC METABOLIC PANEL
BUN: 18 mg/dL (ref 6–23)
CO2: 29 mEq/L (ref 19–32)
Calcium: 9.9 mg/dL (ref 8.4–10.5)
GFR calc non Af Amer: >90 mL/min (ref >90)
Glucose, Bld: 87 mg/dL (ref 70–99)
Sodium: 140 mEq/L (ref 135–145)

## 2012-03-11 LAB — CBC
HCT: 37.7 % — ABNORMAL LOW (ref 39.0–52.0)
Hemoglobin: 12.4 g/dL — ABNORMAL LOW (ref 13.0–17.0)
MCH: 29.8 pg (ref 26.0–34.0)
MCHC: 32.9 g/dL (ref 30.0–36.0)
MCV: 90.6 fL (ref 78.0–100.0)
RBC: 4.16 MIL/uL — ABNORMAL LOW (ref 4.22–5.81)

## 2012-03-11 MED ORDER — METHYLPHENIDATE HCL 5 MG PO TABS
5.0000 mg | ORAL_TABLET | Freq: Two times a day (BID) | ORAL | Status: DC
  Administered 2012-03-11 – 2012-03-16 (×10): 5 mg via ORAL
  Filled 2012-03-11 (×10): qty 1

## 2012-03-11 MED ORDER — CARBAMAZEPINE 200 MG PO TABS
200.0000 mg | ORAL_TABLET | Freq: Two times a day (BID) | ORAL | Status: DC
  Administered 2012-03-11 – 2012-03-17 (×12): 200 mg via ORAL
  Filled 2012-03-11 (×15): qty 1

## 2012-03-11 NOTE — Progress Notes (Signed)
Pt. Exhibits unsafe behavior with poor safety awareness.  MD notifed.  Soft waist restraint belt requested.

## 2012-03-11 NOTE — Progress Notes (Signed)
Speech Language Pathology Session & Weekly Progress Notes  Patient Details  Name: Stephen Buckley MRN: [id number] Date of Birth: 1951-03-23  Today's Date: 03/11/2012 Time: 1400-1445 Time Calculation (min): 45 min  Skilled Therapeutic Intervention: Treatment focus on trials of thin liquids, orientation and sustained attention. Pt consumed puree textures with total A for sustained attention and initiation to task and required total A for redirection. Pt also consumed trials of thin liquids without overt s/s of aspiration. Recommend pt upgrade to Dys. 2 textures and thin liquids.   Short Term Goals: Week 1: SLP Short Term Goal 1 (Week 1): Patient will sustain attention to basic, familiar task for 1 minute increments with mod assist verbal and visual cues. SLP Short Term Goal 1 - Progress (Week 1): Met SLP Short Term Goal 2 (Week 1): Patient will utilize external aids to assist with orientation with max assist multi modal cues. SLP Short Term Goal 2 - Progress (Week 1): Met SLP Short Term Goal 3 (Week 1): Patient will consume Dys.2 textures and nectar-thick liquids without observed s/s of aspiration and max assist to carryover safe swallow strategies. SLP Short Term Goal 3 - Progress (Week 1): Met SLP Short Term Goal 4 (Week 1): Patient will consume trials of thin liquid via cup with mod assist cues and no overt s/s of aspiration. SLP Short Term Goal 4 - Progress (Week 1): Met  New Short Term Goals: Week 2: SLP Short Term Goal 1 (Week 2): Pt will demonstrate sustained attention to functional task for ~1 minute with Mod verbal cues fo redirection  SLP Short Term Goal 2 (Week 2): Pt will utilize external memory aids to orient to place, time and situation with Mod A verbal and visual cues.  SLP Short Term Goal 3 (Week 2): Pt will consume Dys. 2 textures and thin liquids without overt s/s of aspiration with Mod A verbal cues for utilziation of swallowing compensatory strategies.  SLP Short Term  Goal 4 (Week 2): Pt will demosntrate efficient mastication of Dys. 3 textures without overt s/s of aspiration   Weekly Progress Updates: Pt has made functional gains and has met 4 out of 4 STG's this reporting period. Pt is currently tolerating trails of thin liquids without overt s/s of aspiration and recommend pt upgrade to Dys. 2 textures and thin liquids with full supervision. Pt requires Max-total A for utilization of swallowing compensatory strategies. Pt is also overall Max-total A for sustained attention to functional tasks and utilization of external memory aids to increase orientation. Pt would benefit  from continued skilled SLP intervention to maximize swallowing and cognitive function and maximize functional independence.    SLP Frequency: 1-2 X/day, 30-60 minutes Estimated Length of Stay: 1 week SLP Treatment/Interventions: Cognitive remediation/compensation;Cueing hierarchy;Functional tasks;Dysphagia/aspiration precaution training;Internal/external aids;Environmental controls;Patient/family education;Therapeutic Activities  Daily Session FIM:  Comprehension Comprehension Mode: Auditory Comprehension: 1-Understands basic less than 25% of the time/requires cueing 75% of the time Expression Expression Mode: Verbal Expression: 1-Expresses basis less than 25% of the time/requires cueing greater than 75% of the time. Social Interaction Social Interaction: 1-Interacts appropriately less than 25% of the time. May be withdrawn or combative. Problem Solving Problem Solving: 1-Solves basic less than 25% of the time - needs direction nearly all the time or does not effectively solve problems and may need a restraint for safety Memory Memory: 1-Recognizes or recalls less than 25% of the time/requires cueing greater than 75% of the time FIM - Eating Eating Activity: 3: Helper scoops food on utensil every  scoop Pain Pain Assessment Pain Assessment: No/denies pain Pain Score: 0-No  pain Faces Pain Scale: No hurt  Therapy/Group: Individual Therapy  Rosealynn Mateus 03/11/2012, 4:49 PM

## 2012-03-11 NOTE — Progress Notes (Signed)
Pt's significant other requests restraints be discontinued while she is with patient in room.  Restraints discontinued at 1845.  RN will notify night nurse of request.

## 2012-03-11 NOTE — Progress Notes (Signed)
Physical Therapy Weekly Progress Note  Patient Details  Name: Stephen Buckley MRN: [id number] Date of Birth: Aug 07, 1950  Today's Date: 03/11/2012  Patient has met 3 of 4 short term goals.  Pt has improved focused attention and is able to intermittently attend for >30 seconds to functional tasks.  Pt with improved sitting balance requiring only supervision to sit edge of bed.  Pt continues to be limited by decreased attention and awareness requiring +2 assist to safely perform transfers.  Pt unable to follow 1 step commands, unable to follow directions to participate in functional/therapeutic activities at this time.  Patient continues to demonstrate the following deficits: impaired attention, awareness, decreased activity tolerance and therefore will continue to benefit from skilled PT intervention to enhance overall performance with activity tolerance, balance, ability to compensate for deficits, attention, awareness, coordination and knowledge of precautions.  Patient progressing toward long term goals..  Continue plan of care.  PT Short Term Goals Week 1:  PT Short Term Goal 1 (Week 1): Pt will sit EOB for 2 min with min assist.  PT Short Term Goal 1 - Progress (Week 1): Met PT Short Term Goal 2 (Week 1): Pt will perform sliding board transfer with +2 total assist pt = 40%.  PT Short Term Goal 2 - Progress (Week 1): Progressing toward goal PT Short Term Goal 3 (Week 1): Pt will propell wheelchair x 20' with min assist. PT Short Term Goal 3 - Progress (Week 1): Met PT Short Term Goal 4 (Week 1): Pt will demonstrate focused attention to task >/= 30 sec.  PT Short Term Goal 4 - Progress (Week 1): Met Week 2:  PT Short Term Goal 1 (Week 2): Pt will perform bed <> chair transfers with max A PT Short Term Goal 2 (Week 2): Pt will attend to functional task x 60 seconds with min cuing PT Short Term Goal 3 (Week 2): Pt will follow 1 step commands for functional activities with mod  cuing  Skilled Therapeutic Interventions/Progress Updates:  Balance/vestibular training;Discharge planning;Cognitive remediation/compensation;Functional mobility training;Therapeutic Activities;UE/LE Coordination activities;Wheelchair propulsion/positioning;Patient/family education;UE/LE Strength taining/ROM;Pain management;Splinting/orthotics;Neuromuscular re-education;Therapeutic Exercise;DME/adaptive equipment instruction   See FIM for current functional status   Arriel Victor 03/11/2012, 10:39 AM

## 2012-03-11 NOTE — Progress Notes (Signed)
Nutrition Follow-up  Intervention: Continue current interventions.  Assessment:   Pt started on megace yesterday - fiancee reports that pt is eating much better now that he is on the appetite stimulant. Ate 100% of breakfast this morning.  Diet Order:  Dysphagia 2 with Nectar Thickened Liquids Supplement: Ensure Pudding PO QID  Meds: Scheduled Meds:   . antiseptic oral rinse  15 mL Mouth Rinse QID  . chlorhexidine  15 mL Mouth Rinse BID  . feeding supplement  1 Container Oral QID  . megestrol  400 mg Oral BID  . methylphenidate  5 mg Oral BID WC  . multivitamin with minerals  1 tablet Oral Daily  . sertraline  50 mg Oral Daily   Continuous Infusions:  PRN Meds:.acetaminophen, HYDROcodone-acetaminophen, LORazepam, LORazepam, ondansetron (ZOFRAN) IV, ondansetron, polyethylene glycol, RESOURCE THICKENUP CLEAR, sorbitol  Labs:  CMP     Component Value Date/Time   NA 140 03/11/2012 0923   K 3.7 03/11/2012 0923   CL 103 03/11/2012 0923   CO2 29 03/11/2012 0923   GLUCOSE 87 03/11/2012 0923   BUN 18 03/11/2012 0923   CREATININE 0.78 03/11/2012 0923   CALCIUM 9.9 03/11/2012 0923   PROT 7.0 03/04/2012 0554   ALBUMIN 2.9* 03/04/2012 0554   AST 42* 03/04/2012 0554   ALT 35 03/04/2012 0554   ALKPHOS 100 03/04/2012 0554   BILITOT 1.0 03/04/2012 0554   GFRNONAA >90 03/11/2012 0923   GFRAA >90 03/11/2012 0923     Intake/Output Summary (Last 24 hours) at 03/11/12 1234 Last data filed at 03/11/12 0829  Gross per 24 hour  Intake    360 ml  Output      0 ml  Net    360 ml  BM 9/16  Weight Status:  115 lb - wt down 15 lb x 4 days  Estimated needs:  1875 - 2075 kcal, 88 - 100 grams protein  Nutrition Dx: Inadequate oral intake r/t variable attention AEB ongoing weight loss and variable intake. Progressing  Goal: Pt to meet >/= 90% of their estimated nutrition needs; improving  Monitor: weight trends, lab trends, I/O's, PO intake, supplement tolerance  Jarold Motto MS, RD, LDN Pager:  [phone] After-hours pager: [phone]

## 2012-03-11 NOTE — Progress Notes (Signed)
Occupational Therapy Session Note  Patient Details  Name: Stephen Buckley MRN: [id number] Date of Birth: 11-02-1950  Today's Date: 03/11/2012  Session 1 Time: 4098-1191 Time Calculation (min): 45 min  Short Term Goals: Week 1:  OT Short Term Goal 1 (Week 1): Patient will sustain attention for 5 min during a self feeding or grooming task with min cueing OT Short Term Goal 2 (Week 1): Patient will assist with sliding board transfer to drop arm commode with max assist OT Short Term Goal 3 (Week 1): Patient will indicate personal need to staff or girlfriend 50% time, e.g. toileting, temperature, hunger OT Short Term Goal 4 (Week 1): Patient will dress upper body with minimal assistance OT Short Term Goal 5 (Week 1): Patient will dress lower body with maximal assistance  Skilled Therapeutic Interventions/Progress Updates:    Pt in bed upon arrival.  Pt incontinent of bladder and bed was completely wet.  Pt with no awareness that he was laying on a wet bed.  Pt required max verbal cues/encouragement and extra time to roll in bed to allow therapist to remove linens, bathe patient's lower body, and don brief and shorts.  Pt sat EOB with min A after 5 min tangental conversation.  Seated EOB patient donned shirt with no assistance but patient let shirt lay in his lap for 5 mins before donning.  Pt completed bed<>BSC (at patient's request to use toilet) with max A but was tot A + 2 for toileting and transfer to recliner.  Pt did not actively engage in functional tasks this morning.  Conversation was tangental throughout session.  Therapy Documentation Precautions:  Precautions Precautions: Fall Restrictions Weight Bearing Restrictions: Yes RLE Weight Bearing: Non weight bearing LLE Weight Bearing: Non weight bearing   Pain: Pain Assessment Pain Assessment: No/denies pain Pain Score: 0-No pain Faces Pain Scale: Hurts even more Pain Type: Surgical pain Pain Location: Leg Pain Orientation:  Right;Left Pain Descriptors: Tender;Aching Pain Onset: On-going Patients Stated Pain Goal: 2 Pain Intervention(s): Medication (See eMAR) Multiple Pain Sites: No  See FIM for current functional status  Therapy/Group: Individual Therapy  Session 2 Time: 1300-1330 Pt denies pain Individual Therapy Pt in bed covered with blanket upon arrival.  Attempted to engage patient in sitting EOB to eat lunch.  Each attempt to remove blanket resulted in patient becoming agitated and pulling the cover back.  Additional attempts to engage patient in conversation resulted in patient closing eyes and "falling asleep."  Pt continued to tell therapist "just leave me alone."  Rich Brave 03/11/2012, 9:19 AM
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
## 2012-03-11 NOTE — Progress Notes (Signed)
Physical Therapy Note  Patient Details  Name: Kalan Yeley MRN: [id number] Date of Birth: December 10, 1950 Today's Date: 03/11/2012  Time: [phone] 60 minutes  No c/o pain.  Treatment focused on activities for sustained attention and following directions.  Pt able to sustain attention to card sorting/matching task in min distracting environment x 5 minutes with min cuing.  Pt unable to follow directions to deal cards or to participate in turn taking card game.  Towel folding, bed making tasks with max cuing at first, pt with good carry over in session, able to perform on 2nd attempt with min cuing.  Pt perseverating on using bathroom, incontinent of urine.  Pt requires total A to safely transfer to bed for hygiene.  Unable to follow directions for changing clothes, requires total A for safety throughout toileting and dressing.  Individual therapy   Richrd Kuzniar 03/11/2012, 10:43 AM

## 2012-03-12 ENCOUNTER — Inpatient Hospital Stay (HOSPITAL_COMMUNITY): Payer: Medicaid Other

## 2012-03-12 ENCOUNTER — Inpatient Hospital Stay (HOSPITAL_COMMUNITY): Payer: Medicaid Other | Admitting: Speech Pathology

## 2012-03-12 ENCOUNTER — Inpatient Hospital Stay (HOSPITAL_COMMUNITY): Payer: Medicaid Other | Admitting: Occupational Therapy

## 2012-03-12 ENCOUNTER — Inpatient Hospital Stay (HOSPITAL_COMMUNITY): Payer: Medicaid Other | Admitting: Physical Therapy

## 2012-03-12 DIAGNOSIS — IMO0002 Reserved for concepts with insufficient information to code with codable children: Secondary | ICD-10-CM

## 2012-03-12 DIAGNOSIS — S069X9A Unspecified intracranial injury with loss of consciousness of unspecified duration, initial encounter: Secondary | ICD-10-CM

## 2012-03-12 DIAGNOSIS — S82109A Unspecified fracture of upper end of unspecified tibia, initial encounter for closed fracture: Secondary | ICD-10-CM

## 2012-03-12 DIAGNOSIS — S72309A Unspecified fracture of shaft of unspecified femur, initial encounter for closed fracture: Secondary | ICD-10-CM

## 2012-03-12 DIAGNOSIS — Z5189 Encounter for other specified aftercare: Secondary | ICD-10-CM

## 2012-03-12 NOTE — Progress Notes (Signed)
Occupational Therapy Session Note  Patient Details  Name: Stephen Buckley MRN: [id number] Date of Birth: September 29, 1950  Today's Date: 03/12/2012 Time: 0815-0900 Time Calculation (min): 45 min  Short Term Goals: Week 1:  OT Short Term Goal 1 (Week 1): Patient will sustain attention for 5 min during a self feeding or grooming task with min cueing OT Short Term Goal 2 (Week 1): Patient will assist with sliding board transfer to drop arm commode with max assist OT Short Term Goal 3 (Week 1): Patient will indicate personal need to staff or girlfriend 50% time, e.g. toileting, temperature, hunger OT Short Term Goal 4 (Week 1): Patient will dress upper body with minimal assistance OT Short Term Goal 5 (Week 1): Patient will dress lower body with maximal assistance    Skilled Therapeutic Interventions/Progress Updates:    1:1 self care retraining. Pt in bed when arrived this am, eating breakfast with girlfriend. Session primarily focused on maintaining sustained attention and following one step directions (in context) with functional tasks in order to complete one step. Pt required total A to don shirt and pants sitting up in bed due to decreased attention and tangential language. Pt able to come to EOB with supervision with extra time to follow command. Slide board transfer with +2 with a person on each side and pt slid over with min A. Grooming at sink with max A with extra time and contextual cues.  Therapy Documentation Precautions:  Precautions Precautions: Fall Restrictions Weight Bearing Restrictions: Yes RLE Weight Bearing: Non weight bearing LLE Weight Bearing: Non weight bearing Pain:  no c/o pain in session  See FIM for current functional status  Therapy/Group: Individual Therapy  [name] [facility] 03/12/2012, 11:52 AM

## 2012-03-12 NOTE — Progress Notes (Signed)
Occupational Therapy Weekly Progress Note  Patient Details  Name: Stephen Buckley MRN: [id number] Date of Birth: 14-May-1951  Today's Date: 03/12/2012  Patient has met 1 of 5 short term goals.  Pt has made minimal functional gains this past week.  Pt requires max A for  sustained attention for approx 1 min during functional tasks.  Pt requires max/tot A for sliding board transfers to w/c and drop arm BSC.  Pt continues to attempt to stand from EOB or w/c.  Pt exhibits behaviors consistent with [place]. Pt's cognition's significantly impacts his ability to participate fully in functional tasks. Pt has difficulty from refraining from talking during a session contributing to his decreased attention. Pt continues to be disoriented to birthday, place, situation, and time.  Patient continues to demonstrate the following deficits: muscle weakness, decreased visual acuity, decreased initiation, decreased attention, decreased awareness, decreased problem solving, decreased safety awareness, decreased memory and delayed processing and difficulty maintaining precautions and pt is demonstrates tangentile language and therefore will continue to benefit from skilled OT intervention to enhance overall performance with BADL and Reduce care partner burden.  Patient progressing toward long term goals.  Continue plan of care.  OT Short Term Goals Week 1:  OT Short Term Goal 1 (Week 1): Patient will sustain attention for 5 min during a self feeding or grooming task with min cueing OT Short Term Goal 1 - Progress (Week 1): Progressing toward goal OT Short Term Goal 2 (Week 1): Patient will assist with sliding board transfer to drop arm commode with max assist OT Short Term Goal 2 - Progress (Week 1): Progressing toward goal OT Short Term Goal 3 (Week 1): Patient will indicate personal need to staff or girlfriend 50% time, e.g. toileting, temperature, hunger OT Short Term Goal 3 - Progress (Week 1): Progressing  toward goal OT Short Term Goal 4 (Week 1): Patient will dress upper body with minimal assistance OT Short Term Goal 4 - Progress (Week 1): Met OT Short Term Goal 5 (Week 1): Patient will dress lower body with maximal assistance OT Short Term Goal 5 - Progress (Week 1): Progressing toward goal  Week 2:  OT Short Term Goal 1 (Week 2): Patient will sustain attention for 5 min during a self feeding or grooming task with min cueing  OT Short Term Goal 2 (Week 2): Patient will assist with sliding board transfer to drop arm commode with max assist  OT Short Term Goal 3 (Week 2): Patient will indicate personal need to staff or girlfriend 50% time, e.g. toileting, temperature, hunger  OT Short Term Goal 4 (Week 2): Patient will dress lower body with maximal assistance    Therapy Documentation Precautions:  Precautions Precautions: Fall Restrictions Weight Bearing Restrictions: Yes RLE Weight Bearing: Non weight bearing LLE Weight Bearing: Non weight bearing Pain: Pain Assessment Pain Assessment: No/denies pain ADL: ADL Grooming: Maximal assistance;Maximal cueing Where Assessed-Grooming: Sitting at sink Upper Body Bathing: Minimal assistance;Maximal cueing Where Assessed-Upper Body Bathing: Sitting at sink Lower Body Bathing: Dependent Where Assessed-Lower Body Bathing: Bed level Upper Body Dressing: Supervision/safety;Maximal cueing Where Assessed-Upper Body Dressing: Edge of bed Lower Body Dressing: Dependent Where Assessed-Lower Body Dressing: Bed level Toileting: Dependent Where Assessed-Toileting: Bedside Commode Toilet Transfer: Dependent Toilet Transfer Method: Neurosurgeon Equipment: Drop arm bedside commode  See FIM for current functional status  Therapy/Group: Individual Therapy  Rich Brave 03/12/2012, 3:00 PM

## 2012-03-12 NOTE — Progress Notes (Signed)
Occupational Therapy Session Note  Patient Details  Name: Stephen Buckley MRN: [id number] Date of Birth: 12-27-1950  Today's Date: 03/12/2012 Time: 1300-1330 Time Calculation (min): 30 min  Short Term Goals: Week 1:  OT Short Term Goal 1 (Week 1): Patient will sustain attention for 5 min during a self feeding or grooming task with min cueing OT Short Term Goal 2 (Week 1): Patient will assist with sliding board transfer to drop arm commode with max assist OT Short Term Goal 3 (Week 1): Patient will indicate personal need to staff or girlfriend 50% time, e.g. toileting, temperature, hunger OT Short Term Goal 4 (Week 1): Patient will dress upper body with minimal assistance OT Short Term Goal 5 (Week 1): Patient will dress lower body with maximal assistance  Skilled Therapeutic Interventions/Progress Updates:    Pt in bed with covers and eyes closed.  Pt opened eyes when spoke to but immediately closed them again.  Pt responded when spoken to but did not initially engage in conversation independently.  Therapist removed covers and patient became more responsive and began conversing although conversation remains confused and tangental.  Pt not oriented to place or date and did not read calendar when presented.  Pt required tactile cues to sit EOB and then required physical restraint to prevent from standing EOB.  Pt continued to attempt standing EOB even after numerous explanations.  Pt required max verbal cues for redirection.  Pt required max A for sliding board transfer to recliner.  Focus on attention to task and active participation in functional tasks.  Therapy Documentation Precautions:  Precautions Precautions: Fall Restrictions Weight Bearing Restrictions: Yes RLE Weight Bearing: Non weight bearing LLE Weight Bearing: Non weight bearing    See FIM for current functional status  Therapy/Group: Individual Therapy  Rich Brave 03/12/2012, 2:34 PM

## 2012-03-12 NOTE — Progress Notes (Signed)
Speech Language Pathology Daily Session Note  Patient Details  Name: Chesney Klimaszewski MRN: [id number] Date of Birth: [birth year]  Today's Date: 03/12/2012 Time: 1345-1430 Time Calculation (min): 45 min  Short Term Goals: Week 2: SLP Short Term Goal 1 (Week 2): Pt will demonstrate sustained attention to functional task for ~1 minute with Mod verbal cues fo redirection  SLP Short Term Goal 2 (Week 2): Pt will utilize external memory aids to orient to place, time and situation with Mod A verbal and visual cues.  SLP Short Term Goal 3 (Week 2): Pt will consume Dys. 2 textures and thin liquids without overt s/s of aspiration with Mod A verbal cues for utilziation of swallowing compensatory strategies.  SLP Short Term Goal 4 (Week 2): Pt will demosntrate efficient mastication of Dys. 3 textures without overt s/s of aspiration   Skilled Therapeutic Interventions: Treatment focus on sustained attention and swallowing safety with upgraded diet of Dys. 2 textures and thin liquids. Pt demonstrated increased appropriate social responses, however, required total A for self-feeding due to decreased sustained attention to meal. Pt required total A for redirection for sustained attention for ~30 seconds. Pt taking large, consecutive sips of thin liquid without overt s/s of aspiration.    FIM:  Comprehension Comprehension Mode: Auditory Comprehension: 1-Understands basic less than 25% of the time/requires cueing 75% of the time Expression Expression Mode: Verbal Expression: 1-Expresses basis less than 25% of the time/requires cueing greater than 75% of the time. Social Interaction Social Interaction: 1-Interacts appropriately less than 25% of the time. May be withdrawn or combative. Problem Solving Problem Solving: 1-Solves basic less than 25% of the time - needs direction nearly all the time or does not effectively solve problems and may need a restraint for safety Memory Memory: 1-Recognizes or  recalls less than 25% of the time/requires cueing greater than 75% of the time FIM - Eating Eating Activity: 3: Helper brings food to mouth every scoop  Pain Pain Assessment Pain Assessment: No/denies pain  Therapy/Group: Individual Therapy  Christapher Gillian 03/12/2012, 2:50 PM

## 2012-03-12 NOTE — Progress Notes (Signed)
Physical Therapy Note  Patient Details  Name: Stephen Buckley MRN: [id number] Date of Birth: 05/18/1951 Today's Date: 03/12/2012  Time: [phone] 60 minutes  No c/o pain.  Pt participated in functional activity to prepare a cup of coffee with mod-max cuing.  Better able to follow directions this session, continues to need max A for attention to task.  Sliding board transfer to car with pt not fighting at all, allowing PT to sliding him safely in and out of car.  Pt did not assist with transfer but did not resist assistance and use of sliding board.  Attempt to engage in card game, pt able to shuffle and deal cards, unable to follow directions to play simple game with turn taking.  Pt able to sort cards by suit and color with supervision, max cues for attention.  Individual therapy   Marsena Taff 03/12/2012, 9:59 AM
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
## 2012-03-13 ENCOUNTER — Inpatient Hospital Stay (HOSPITAL_COMMUNITY): Payer: Medicaid Other

## 2012-03-13 ENCOUNTER — Inpatient Hospital Stay (HOSPITAL_COMMUNITY): Payer: Medicaid Other | Admitting: Physical Therapy

## 2012-03-13 ENCOUNTER — Inpatient Hospital Stay (HOSPITAL_COMMUNITY): Payer: Medicaid Other | Admitting: Speech Pathology

## 2012-03-13 MED ORDER — TRAZODONE HCL 50 MG PO TABS
100.0000 mg | ORAL_TABLET | Freq: Every day | ORAL | Status: DC
  Administered 2012-03-13 – 2012-03-17 (×3): 100 mg via ORAL
  Filled 2012-03-13 (×4): qty 2

## 2012-03-13 NOTE — Progress Notes (Signed)
Speech Language Pathology Daily Session Note  Patient Details  Name: Stephen Buckley MRN: [id number] Date of Birth: 05-Jul-1950  Today's Date: 03/13/2012 Time: 8657-8469 Time Calculation (min): 45 min  Short Term Goals: Week 2: SLP Short Term Goal 1 (Week 2): Pt will demonstrate sustained attention to functional task for ~1 minute with Mod verbal cues fo redirection  SLP Short Term Goal 2 (Week 2): Pt will utilize external memory aids to orient to place, time and situation with Mod A verbal and visual cues.  SLP Short Term Goal 3 (Week 2): Pt will consume Dys. 2 textures and thin liquids without overt s/s of aspiration with Mod A verbal cues for utilziation of swallowing compensatory strategies.  SLP Short Term Goal 4 (Week 2): Pt will demosntrate efficient mastication of Dys. 3 textures without overt s/s of aspiration   Skilled Therapeutic Interventions: Treatment focus on auditory comprehension, orientation and participation in a functional task. Pt participated in simple kitchen task of making pudding and read directions for task independently and required total A to decrease perseveration and redirection from reading task. Pt followed 1 step directions with Max verbal and visual cues and sustained attention to task for ~ 1 minute.  Pt also required Mod visual cues for written expression of biographical information.    FIM:  Comprehension Comprehension Mode: Auditory Comprehension: 1-Understands basic less than 25% of the time/requires cueing 75% of the time Expression Expression Mode: Verbal Expression: 1-Expresses basis less than 25% of the time/requires cueing greater than 75% of the time. Social Interaction Social Interaction: 2-Interacts appropriately 25 - 49% of time - Needs frequent redirection. Problem Solving Problem Solving: 1-Solves basic less than 25% of the time - needs direction nearly all the time or does not effectively solve problems and may need a restraint for  safety Memory Memory: 1-Recognizes or recalls less than 25% of the time/requires cueing greater than 75% of the time FIM - Eating Eating Activity: 5: Supervision/cues  Pain Pain Assessment Pain Assessment: No/denies pain  Therapy/Group: Individual Therapy  [name] 03/13/2012, 6:08 PM
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
## 2012-03-13 NOTE — Plan of Care (Signed)
Problem: RH SAFETY Goal: RH STG ADHERE TO SAFETY PRECAUTIONS W/ASSISTANCE/DEVICE STG Adhere to Safety Precautions With mod Assistance/Device.  Outcome: Not Progressing On waist belt restraint

## 2012-03-13 NOTE — Progress Notes (Signed)
Social Work Patient ID: Stephen Buckley, male   DOB: March 06, 1951, 61 y.o.   MRN: [id number]  Met with patient and girlfriend yesterday to review team conference.  Pt continues with cognitive deficits and behavioral issues.  Girlfriend reports her biggest concern is his behavior at nighttime and managing this at home.  She also reports that pt's family (brothers and extended family) "...don't understand how to manage him...they want to argue with him when he says the wrong thing...". Told girlfriend that we could certainly provide TBI education with family prior to d/c.  She plans to speak with family and ask that they plan to come in next week for education.  She will let me know what day so I can schedule family ed with therapies.  Continue to follow and plan toward 9/27 discharge.  Jen Eppinger

## 2012-03-13 NOTE — Progress Notes (Signed)
Occupational Therapy Session Note  Patient Details  Name: Stephen Buckley MRN: [id number] Date of Birth: 10-14-1950  Today's Date: 03/13/2012  Session 1 Time: 7846-9629 Time Calculation (min): 45 min  Short Term Goals: Week 2:  OT Short Term Goal 1 (Week 2): Patient will sustain attention for 5 min during a self feeding or grooming task with min cueing  OT Short Term Goal 2 (Week 2): Patient will assist with sliding board transfer to drop arm commode with max assist  OT Short Term Goal 3 (Week 2): Patient will indicate personal need to staff or girlfriend 50% time, e.g. toileting, temperature, hunger  OT Short Term Goal 4 (Week 2): Patient will dress lower body with maximal assistance   Skilled Therapeutic Interventions/Progress Updates:    Pt in bed soaked with urine and no indication of awareness.  Pt required max verbal cues to roll in bed for therapist to assist with cleaning.  Pt did not initiate cleaning/bathing LB while in bed.  Pt required max verbal cues to roll in bed to allow therapist to dress LB.  Pt sat EOB after approx 10 mins laying in bed.  Pt continued to carry on tangential conversation throughout process of sitting EOB.  Pt would roll over to one side and then roll back and initiate sitting up and then stop.  Sitting EOB was not a fluid movement but stop-and-go.  Although patient did not assist with sliding board transfer he did not resist therapist.  Attempted to engage pt in grooming activities but patient would not attend to directions by therapist and was intent in relating tangential story to therapist.  Focus on participation in functional activities, attention to task, safety awareness, and safety awareness.  Therapy Documentation Precautions:  Precautions Precautions: Fall Restrictions Weight Bearing Restrictions: Yes RLE Weight Bearing: Non weight bearing LLE Weight Bearing: Non weight bearing   Pain:  Pt denies pain  See FIM for current functional  status  Therapy/Group: Individual Therapy  Session 2 Time: 1300-1330 Pt stated he had a headache; RN notified Individual therapy  Pt in recliner upon arrival.  Attempted to engage patient in card game.  Pt sated he liked to play cards to gamble but unable to describe to therapist what games.  Throughout session patient engaged in tangential conversation with therapist.  When therapist attempted to redirect patient to task, the patient would "redirect" therapist to conversation. Pt attempted to bear weight through BLE on several occasions but was easily redirected.  Focus on attention to task.   [name] [facility] 03/13/2012, 9:15 AM
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
## 2012-03-13 NOTE — Progress Notes (Signed)
Physical Therapy Note  Patient Details  Name: Quinshawn Shong MRN: [id number] Date of Birth: Sep 28, 1950 Today's Date: 03/13/2012  Time: 1300-1400 60 minutes  No c/o pain.  Treatment focused on attention and following directions.  Pt required increased time and max-total cuing for attention to task for sliding board transfer to car with total A, pt initially resistant but then allowed PT to transfer him over safely.  Kitchen Data processing manager task with max-total cuing for following directions and attention to tasks.  Pt hyperverbal, continuing to talk throughout treatment thus limiting his attention to therapeutic activities.  Attempted to have pt perform grooming/hygiene at sink, pt unable to attend and perform without total A.  Pt with increasing agitation at end of session.  Required +2 assist for sliding board to bed, +2 for sit to supine due to increased agitation and decreased focused attention.  Attempt to clean pt due to episode of urinary incontinence.  Pt repeatedly sitting up in bed, removing brief after it was changed, refusing to put on new clothes.  RN made aware of increasingly agitated state.   Individual therapy   Aislin Onofre 03/13/2012, 2:45 PM

## 2012-03-14 ENCOUNTER — Inpatient Hospital Stay (HOSPITAL_COMMUNITY): Payer: Medicaid Other | Admitting: Speech Pathology

## 2012-03-14 ENCOUNTER — Inpatient Hospital Stay (HOSPITAL_COMMUNITY): Payer: Medicaid Other | Admitting: Physical Therapy

## 2012-03-14 ENCOUNTER — Inpatient Hospital Stay (HOSPITAL_COMMUNITY): Payer: Self-pay

## 2012-03-14 ENCOUNTER — Encounter (HOSPITAL_COMMUNITY): Payer: Self-pay

## 2012-03-14 MED ORDER — MEGESTROL ACETATE 400 MG/10ML PO SUSP
400.0000 mg | Freq: Every day | ORAL | Status: DC
  Administered 2012-03-14 – 2012-03-18 (×5): 400 mg via ORAL
  Filled 2012-03-14 (×6): qty 10

## 2012-03-14 NOTE — Progress Notes (Signed)
Speech Language Pathology Daily Session Note  Patient Details  Name: [name] MRN: [id number] Date of Birth: 1951-03-03  Today's Date: 03/14/2012 Time: 1000-1045 Time Calculation (min): 45 min  Short Term Goals: Week 2: SLP Short Term Goal 1 (Week 2): Pt will demonstrate sustained attention to functional task for ~1 minute with Mod verbal cues fo redirection  SLP Short Term Goal 2 (Week 2): Pt will utilize external memory aids to orient to place, time and situation with Mod A verbal and visual cues.  SLP Short Term Goal 3 (Week 2): Pt will consume Dys. 2 textures and thin liquids without overt s/s of aspiration with Mod A verbal cues for utilziation of swallowing compensatory strategies.  SLP Short Term Goal 4 (Week 2): Pt will demosntrate efficient mastication of Dys. 3 textures without overt s/s of aspiration   Skilled Therapeutic Interventions: Treatment focus on sustained attention, orientation and functional problem solving. Pt with decreased verbosity during session and demonstrating intellectual awareness by asking clinician why he was in the hospital. Pt participated in functional sorting/counting task and required max A for sustained attention to task and total A for functional problem solving.    FIM:  Comprehension Comprehension Mode: Auditory Comprehension: 1-Understands basic less than 25% of the time/requires cueing 75% of the time Expression Expression Mode: Verbal Expression: 1-Expresses basis less than 25% of the time/requires cueing greater than 75% of the time. Social Interaction Social Interaction: 2-Interacts appropriately 25 - 49% of time - Needs frequent redirection. Problem Solving Problem Solving: 1-Solves basic less than 25% of the time - needs direction nearly all the time or does not effectively solve problems and may need a restraint for safety Memory Memory: 1-Recognizes or recalls less than 25% of the time/requires cueing greater than 75% of the  time  Pain Pain Assessment Pain Assessment: No/denies pain  Therapy/Group: Individual Therapy  Stephen Buckley 03/14/2012, 3:58 PM

## 2012-03-14 NOTE — Progress Notes (Signed)
Occupational Therapy Session Note  Patient Details  Name: Stephen Buckley MRN: [id number] Date of Birth: 06-07-1951  Today's Date: 03/14/2012  Session 1 Time: 4098-1191 Time Calculation (min): 45 min  Short Term Goals: Week 2:  OT Short Term Goal 1 (Week 2): Patient will sustain attention for 5 min during a self feeding or grooming task with min cueing  OT Short Term Goal 2 (Week 2): Patient will assist with sliding board transfer to drop arm commode with max assist  OT Short Term Goal 3 (Week 2): Patient will indicate personal need to staff or girlfriend 50% time, e.g. toileting, temperature, hunger  OT Short Term Goal 4 (Week 2): Patient will dress lower body with maximal assistance   Skilled Therapeutic Interventions/Progress Updates:    Pt in bed covered with blankets.  Therapist removed blankets and patient sat EOB without assistance after allowing patient approx 4 mins to initiate.  While sitting EOB patient attempted to stand up and had to be physically restrained.  Pt did not become agitated but stated he needed to use bathroom.  Pt was incontinent of bladder before BSC could be placed.  Pt required max A to transfer to [facility].  While seated pt engaged in bathing UB with max verbal/tactile cues to initiate and complete tasks.  Pt donned shirt with extra time and tactile cues when allowed extra time to initiate.  Pt required tot A for LB dressing.  Pt transferred to recliner with max A for sliding board transfer.  Pt conversation tangential throughout session.  Pt required max verbal cues to redirect to tasks throughout session.  Therapy Documentation Precautions:  Precautions Precautions: Fall Restrictions Weight Bearing Restrictions: Yes RLE Weight Bearing: Non weight bearing LLE Weight Bearing: Non weight bearing    See FIM for current functional status  Therapy/Group: Individual Therapy  Session 2 Time: 1130-1200 Pt denies pain Individual Therapy Pt seated in recliner  upon arrival.  Pt transitioned to ADL apartment and kitchen area for sorting tasks.  Pt able to sort utensil with max verbal cues for redirection throughout task.  Pt continues carry on tangential conversation throughout session but is aware when therapist does not attend to what he is saying.  Focus on attention and task initiation.  Pt does not exhibit any awareness of deficits and is not oriented to location, date, or incident.  [name] [facility] 03/14/2012, 9:27 AM

## 2012-03-14 NOTE — Progress Notes (Signed)
Physical Therapy Note  Patient Details  Name: Stephen Buckley MRN: [id number] Date of Birth: July 17, 1950 Today's Date: 03/14/2012  Time: 1345-1445 60 minutes  No c/o pain.  Pt transferred with +2 total A recliner to w/c.  Pt performed w/c mobility throughout unit with mod cuing for attention to task, pt requires hand over hand assist to initiate w/c propulsion as pt distracted by leg rests, brakes.  Each time pt terminated w/c propulsion due to decreased attention he required hand over hand cuing to initiate propulsion again.  Making lemonade task with max cuing.  Pt able to follow 1 step commands with verbal and visual cues.  Sliding board transfer w/c to bed with max A.  Pt less resistive to sliding board transfers, continues not to assist or resist.  Pt required +1 total assist for changing brief in bed.  Pt with increased agitation and restlessness at conclusion of session, focused attention <5 seconds.  Individual therapy   DONAWERTH,KAREN 03/14/2012, 2:49 PM

## 2012-03-14 NOTE — Plan of Care (Signed)
Problem: RH BOWEL ELIMINATION Goal: RH STG MANAGE BOWEL W/EQUIPMENT W/ASSISTANCE STG Manage Bowel With Equipment With Mod Assistance  Outcome: Not Progressing No BM since  03/10/12.  Problem: RH BLADDER ELIMINATION Goal: RH STG MANAGE BLADDER WITH ASSISTANCE STG Manage Bladder With Mod Assistance  Outcome: Not Progressing Not progressing patient is incontinent.  Comments:  Patient is confused. Attempting to toilet patient q3hrs to bedside commode or with urinal. A. Cherril Hett,LPN
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
## 2012-03-15 ENCOUNTER — Inpatient Hospital Stay (HOSPITAL_COMMUNITY): Payer: Medicaid Other

## 2012-03-15 DIAGNOSIS — S72309A Unspecified fracture of shaft of unspecified femur, initial encounter for closed fracture: Secondary | ICD-10-CM

## 2012-03-15 DIAGNOSIS — S069X9A Unspecified intracranial injury with loss of consciousness of unspecified duration, initial encounter: Secondary | ICD-10-CM

## 2012-03-15 DIAGNOSIS — S82109A Unspecified fracture of upper end of unspecified tibia, initial encounter for closed fracture: Secondary | ICD-10-CM

## 2012-03-15 DIAGNOSIS — IMO0002 Reserved for concepts with insufficient information to code with codable children: Secondary | ICD-10-CM

## 2012-03-15 DIAGNOSIS — Z5189 Encounter for other specified aftercare: Secondary | ICD-10-CM

## 2012-03-15 MED ORDER — OXYCODONE HCL 10 MG PO TB12
10.0000 mg | ORAL_TABLET | Freq: Two times a day (BID) | ORAL | Status: DC
  Administered 2012-03-15 – 2012-03-25 (×21): 10 mg via ORAL
  Filled 2012-03-15 (×21): qty 1
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
## 2012-03-15 NOTE — Progress Notes (Signed)
Physical Therapy Session Note  Patient Details  Name: Stephen Buckley MRN: [id number] Date of Birth: 01/29/1951  Today's Date: 03/15/2012 Time: 0900-0935 Time Calculation (min): 35 min  Short Term Goals: Week 2:  PT Short Term Goal 1 (Week 2): Pt will perform bed <> chair transfers with max A PT Short Term Goal 2 (Week 2): Pt will attend to functional task x 60 seconds with min cuing PT Short Term Goal 3 (Week 2): Pt will follow 1 step commands for functional activities with mod cuing  Skilled Therapeutic Interventions/Progress Updates:  Therapist assisted patient to don clothes due to time.Patient supine to sit with supervision. Patient transferred bed to wheelchair with sliding board and max assist to keep patient from weight bearing on bilateral LE's. Patient propelled wheelchair greater than 150 feet on level tile and carpeted surfaces in home like setting. Patient was able to negotiate obstacles with constant cueing. Patient transferred to recliner with sliding board and max assist for LE's and was secured with waist belt and left at nurses station.  Therapy Documentation Precautions:  Precautions Precautions: Fall Restrictions Weight Bearing Restrictions: Yes RLE Weight Bearing: Non weight bearing LLE Weight Bearing: Non weight bearing  Pain: Pain Assessment Pain Assessment: No/denies pain  See FIM for current functional status  Therapy/Group: Individual Therapy  Arelia Longest M 03/15/2012, 12:18 PM

## 2012-03-16 ENCOUNTER — Inpatient Hospital Stay (HOSPITAL_COMMUNITY): Payer: Medicaid Other | Admitting: Physical Therapy

## 2012-03-16 NOTE — Progress Notes (Signed)
Patient has been very agitated, extremely loud, verbally abusive and combative when he was awake during night shift. Patient has been given ativan prn twice during the shift. Ativan is mildly effectivePatient has been repositioned and reoriented constantly during the shift. Patient has only remained in bed due to the soft waist restraints, otherwise he would get out of bed and fall. Patient's girlfriend has been extremely supportive and assisted staff during the shift to help relax and calm the patient. Will continue to monitor.

## 2012-03-16 NOTE — Plan of Care (Signed)
Problem: RH BOWEL ELIMINATION Goal: RH STG MANAGE BOWEL W/MEDICATION W/ASSISTANCE STG Manage Bowel with Medication with Max Assistance.  Outcome: Not Progressing Patient's last bowel movement 03/10/12 after sorbitol.  Problem: RH BLADDER ELIMINATION Goal: RH STG MANAGE BLADDER WITH ASSISTANCE STG Manage Bladder With Mod Assistance  Outcome: Adequate for Discharge Patient incontinent of urine  Problem: RH SAFETY Goal: RH STG ADHERE TO SAFETY PRECAUTIONS W/ASSISTANCE/DEVICE STG Adhere to Safety Precautions With mod Assistance/Device.  Outcome: Not Progressing Patient with poor safety awareness and poor judgement.
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
## 2012-03-17 ENCOUNTER — Encounter (HOSPITAL_COMMUNITY): Payer: Self-pay

## 2012-03-17 ENCOUNTER — Inpatient Hospital Stay (HOSPITAL_COMMUNITY): Payer: Self-pay

## 2012-03-17 ENCOUNTER — Inpatient Hospital Stay (HOSPITAL_COMMUNITY): Payer: Medicaid Other | Admitting: Speech Pathology

## 2012-03-17 ENCOUNTER — Inpatient Hospital Stay (HOSPITAL_COMMUNITY): Payer: Self-pay | Admitting: Physical Therapy

## 2012-03-17 DIAGNOSIS — IMO0002 Reserved for concepts with insufficient information to code with codable children: Secondary | ICD-10-CM

## 2012-03-17 DIAGNOSIS — K922 Gastrointestinal hemorrhage, unspecified: Secondary | ICD-10-CM

## 2012-03-17 DIAGNOSIS — S72309A Unspecified fracture of shaft of unspecified femur, initial encounter for closed fracture: Secondary | ICD-10-CM

## 2012-03-17 DIAGNOSIS — S069X9A Unspecified intracranial injury with loss of consciousness of unspecified duration, initial encounter: Secondary | ICD-10-CM

## 2012-03-17 DIAGNOSIS — S82109A Unspecified fracture of upper end of unspecified tibia, initial encounter for closed fracture: Secondary | ICD-10-CM

## 2012-03-17 MED ORDER — CARBAMAZEPINE 200 MG PO TABS
200.0000 mg | ORAL_TABLET | Freq: Three times a day (TID) | ORAL | Status: DC
  Administered 2012-03-17 – 2012-03-27 (×30): 200 mg via ORAL
  Filled 2012-03-17 (×36): qty 1

## 2012-03-17 NOTE — Progress Notes (Signed)
Occupational Therapy Session Note  Patient Details  Name: Stephen Buckley MRN: [id number] Date of Birth: Jan 13, 1951  Today's Date: 03/17/2012  Session 1 Time: 0800-0845 Time Calculation (min): 45 min   Skilled Therapeutic Interventions/Progress Updates:    Pt in bed upon arrival and stated that he needed to go to bathroom.  Pt performed bed>w/c transfer with sliding board at max A.  Pt required max verbal cues to attend to task and perseverated on getting to the bathroom by walking.  Therapist reeducated on BLE NWBing restrictions.  Pt assisted to toilet with tot A.  Pt uncooperative with attempting sliding board transfer to toilet.  Pt able to perform hygiene but required assistance with clothing management.  Pt completed UB bathing and dressing tasks seated in w/c at sink.  Pt required max verbal cues to redirect to task after approx 15 secs sustained attention.  Pt required tot A for LB dressing.  Pt performed sliding board transfer w/c>recliner with min A and max verbal cues to attend to task.  During transfer patient stopped to converse with therapist and required max verbal cues to redirect to task.  Therapy Documentation Precautions:  Precautions Precautions: Fall Restrictions Weight Bearing Restrictions: Yes RLE Weight Bearing: Non weight bearing LLE Weight Bearing: Non weight bearing  Pain: Pain Assessment Pain Assessment: No/denies pain Faces Pain Scale: Hurts a little bit Pain Intervention(s): Medication (See eMAR)  See FIM for current functional status  Therapy/Group: Individual Therapy  Session 2 Time: 1305-1330 Pt denies pain Individual therapy Pt in bed upon arrival and required max verbal and tactile cues to sit EOB.  Pt stated he needed to use toilet.  Pt performed sliding board transfer w/c>bed with min A but required tot A for transfer to toilet because pt refused to use sliding board.  Focus on transfers, attention to task, safety awareness, and toileting  tasks.  [name] [facility] 03/17/2012, 8:46 AM

## 2012-03-17 NOTE — Plan of Care (Signed)
Problem: RH SAFETY Goal: RH STG ADHERE TO SAFETY PRECAUTIONS W/ASSISTANCE/DEVICE STG Adhere to Safety Precautions With mod Assistance/Device.  Outcome: Not Progressing Patient continue to act impulsive trying to get up and walk, patient on soft waste belt restraint.

## 2012-03-17 NOTE — Progress Notes (Signed)
Patient bed alarm sounding and patient noted sitting at edge of bed with waist belt intact trying  to stand due to needing to toilet  . Attempting to reinforce to patient to allow staff to release waist belt . Once patient released patient began to walk to bathroom patient already incontinent of liquid stool  and staff x 2 had to drag patient in ambulating position to get to bathroom due to patient attempting to walk to bathroom. Patient is nonweight bearing and due to poor cognitive issues did not listen to staff and argued to go to bathroom. After toileting patient and hygiene provided patient assisted in wheelchair and transferred to bed .                                                                   Stephen Buckley

## 2012-03-17 NOTE — Progress Notes (Signed)
Nutrition Follow-up  Intervention: Continue current interventions.  Assessment:   Advanced to Dysphagia 3 diet with thin liquids on 9/23. Continues on Megace for appetite. Consuming mostly 50 - 100% of meals.  Diet Order:  Dysphagia 3 with Thin Liquids Supplement: Ensure Pudding PO QID  Meds: Scheduled Meds:    . antiseptic oral rinse  15 mL Mouth Rinse QID  . carbamazepine  200 mg Oral TID  . chlorhexidine  15 mL Mouth Rinse BID  . feeding supplement  1 Container Oral QID  . megestrol  400 mg Oral Daily  . oxyCODONE  10 mg Oral Q12H  . traZODone  100 mg Oral QHS  . DISCONTD: carbamazepine  200 mg Oral q12n4p   Continuous Infusions:  PRN Meds:.acetaminophen, HYDROcodone-acetaminophen, LORazepam, LORazepam, ondansetron (ZOFRAN) IV, ondansetron, polyethylene glycol, RESOURCE THICKENUP CLEAR, sorbitol  Labs:  CMP     Component Value Date/Time   NA 140 03/11/2012 0923   K 3.7 03/11/2012 0923   CL 103 03/11/2012 0923   CO2 29 03/11/2012 0923   GLUCOSE 87 03/11/2012 0923   BUN 18 03/11/2012 0923   CREATININE 0.78 03/11/2012 0923   CALCIUM 9.9 03/11/2012 0923   PROT 7.0 03/04/2012 0554   ALBUMIN 2.9* 03/04/2012 0554   AST 42* 03/04/2012 0554   ALT 35 03/04/2012 0554   ALKPHOS 100 03/04/2012 0554   BILITOT 1.0 03/04/2012 0554   GFRNONAA >90 03/11/2012 0923   GFRAA >90 03/11/2012 0923     Intake/Output Summary (Last 24 hours) at 03/17/12 1447 Last data filed at 03/17/12 1352  Gross per 24 hour  Intake    720 ml  Output      0 ml  Net    720 ml  BM 9/23  Weight Status:  116 lb - wt now stabilizing  Estimated needs:  1875 - 2075 kcal, 88 - 100 grams protein  Nutrition Dx: Inadequate oral intake r/t variable attention AEB ongoing weight loss and variable intake. Improving.  Goal: Pt to meet >/= 90% of their estimated nutrition needs; improving  Monitor: weight trends, lab trends, I/O's, PO intake, supplement tolerance  Jarold Motto MS, RD, LDN Pager: [phone] After-hours  pager: [phone]

## 2012-03-17 NOTE — Progress Notes (Signed)
Physical Therapy Session Note  Patient Details  Name: Stephen Buckley MRN: [id number] Date of Birth: June 04, 1951  Today's Date: 03/17/2012 Time: 4098-1191 Time Calculation (min): 57 min  Short Term Goals: Week 1:  PT Short Term Goal 1 (Week 1): Pt will sit EOB for 2 min with min assist.  PT Short Term Goal 1 - Progress (Week 1): Met PT Short Term Goal 2 (Week 1): Pt will perform sliding board transfer with +2 total assist pt = 40%.  PT Short Term Goal 2 - Progress (Week 1): Progressing toward goal PT Short Term Goal 3 (Week 1): Pt will propell wheelchair x 20' with min assist. PT Short Term Goal 3 - Progress (Week 1): Met PT Short Term Goal 4 (Week 1): Pt will demonstrate focused attention to task >/= 30 sec.  PT Short Term Goal 4 - Progress (Week 1): Met  Skilled Therapeutic Interventions/Progress Updates:   Pt on toilet with OT upon entering the room, took over with pt on toilet.  After 20 min of trying to direct and assist pt with getting his pants up and transfer back to w/c called nursing in to see how pt transferred to toilet, not pt's nurse, called OT back in who did a total life on the pt toilet to w/c.  Performed cognitive task with pt of adding up the total of dice rolled, while doing this, determined that pt has diplopia, closing his left eye to correct, note too, increase swelling around left eye.  Pt performed w/c on unit total of 150' with instruction and gesture cues every 25' to keep pt on task.  Performed UE bike x 6 min for UE strengthening and to address pt's ability to stay on task, again pt needing instructional cues to continue every 1 1/2 min.  Note several times therapist had to restrain pt to keep him from trying to stand and get out of w/c.   Therapy Documentation Precautions:  Precautions Precautions: Fall Restrictions Weight Bearing Restrictions: Yes RLE Weight Bearing: Non weight bearing LLE Weight Bearing: Non weight bearing Pain:  No pain  See FIM for  current functional status  Therapy/Group: Individual Therapy  [name] 03/17/2012, 3:41 PM

## 2012-03-17 NOTE — Progress Notes (Signed)
Patient impulsive, very agitated, verbally abusive, and combative when trying to put in bed .  Pain med and ativan given.  Patient continue with soft waist restraints.  Patient's girlfriend at the bed side and is very supportive.  Will continue with plan of care.
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
## 2012-03-17 NOTE — Progress Notes (Signed)
Speech Language Pathology Daily Session Note  Patient Details  Name: Stephen Buckley MRN: [id number] Date of Birth: Nov 28, 1950  Today's Date: 03/17/2012 Time: 2130-8657 Time Calculation (min): 45 min  Short Term Goals: Week 2: SLP Short Term Goal 1 (Week 2): Pt will demonstrate sustained attention to functional task for ~1 minute with Mod verbal cues fo redirection  SLP Short Term Goal 2 (Week 2): Pt will utilize external memory aids to orient to place, time and situation with Mod A verbal and visual cues.  SLP Short Term Goal 3 (Week 2): Pt will consume Dys. 2 textures and thin liquids without overt s/s of aspiration with Mod A verbal cues for utilziation of swallowing compensatory strategies.  SLP Short Term Goal 4 (Week 2): Pt will demosntrate efficient mastication of Dys. 3 textures without overt s/s of aspiration   Skilled Therapeutic Interventions: Treatment focus on sustained attention, orientation and trials of Dys. 3 textures. Pt demonstrated sustained attention to a functional task for ~3  minutes with Mod verbal cues for redirection to task. Pt utilized calendar to recall date with Mod verbal cues and total A to orient to place and situation. Pt participated in making a visual aid to increase orientation/recall with Max verbal cues.  Pt consumed trials of Dys. 3 textures and demonstrated efficient mastication without overt s/s of aspiration. Pt utilized menu to choose meal options with Max question and semantic cues.     FIM:  Comprehension Comprehension Mode: Auditory Comprehension: 2-Understands basic 25 - 49% of the time/requires cueing 51 - 75% of the time Expression Expression Mode: Verbal Expression: 2-Expresses basic 25 - 49% of the time/requires cueing 50 - 75% of the time. Uses single words/gestures. Social Interaction Social Interaction: 2-Interacts appropriately 25 - 49% of time - Needs frequent redirection. Problem Solving Problem Solving: 1-Solves basic less than  25% of the time - needs direction nearly all the time or does not effectively solve problems and may need a restraint for safety Memory Memory: 1-Recognizes or recalls less than 25% of the time/requires cueing greater than 75% of the time FIM - Eating Eating Activity: 5: Supervision/cues  Pain Pain Assessment Pain Assessment: No/denies pain  Therapy/Group: Individual Therapy  Vanesha [place] 03/17/2012, 11:37 AM

## 2012-03-18 ENCOUNTER — Inpatient Hospital Stay (HOSPITAL_COMMUNITY): Payer: Medicaid Other | Admitting: Speech Pathology

## 2012-03-18 ENCOUNTER — Inpatient Hospital Stay (HOSPITAL_COMMUNITY): Payer: Medicaid Other

## 2012-03-18 ENCOUNTER — Inpatient Hospital Stay (HOSPITAL_COMMUNITY): Payer: Medicaid Other | Admitting: Physical Therapy

## 2012-03-18 MED ORDER — TRAZODONE HCL 50 MG PO TABS
150.0000 mg | ORAL_TABLET | Freq: Every day | ORAL | Status: DC
  Administered 2012-03-18 – 2012-03-19 (×2): 150 mg via ORAL
  Filled 2012-03-18 (×2): qty 3
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
## 2012-03-18 NOTE — Progress Notes (Signed)
Speech Language Pathology Session & Weekly Progress Notes  Patient Details  Name: Stephen Buckley MRN: [id number] Date of Birth: 09-30-1950  Today's Date: 03/18/2012 Time: 1000-1045 Time Calculation (min): 45 min  Short Term Goals: Week 2: SLP Short Term Goal 1 (Week 2): Pt will demonstrate sustained attention to functional task for ~1 minute with Mod verbal cues fo redirection  SLP Short Term Goal 1 - Progress (Week 2): Met SLP Short Term Goal 2 (Week 2): Pt will utilize external memory aids to orient to place, time and situation with Mod A verbal and visual cues.  SLP Short Term Goal 2 - Progress (Week 2): Not met SLP Short Term Goal 3 (Week 2): Pt will consume Dys. 2 textures and thin liquids without overt s/s of aspiration with Mod A verbal cues for utilziation of swallowing compensatory strategies.  SLP Short Term Goal 3 - Progress (Week 2): Met SLP Short Term Goal 4 (Week 2): Pt will demosntrate efficient mastication of Dys. 3 textures without overt s/s of aspiration  SLP Short Term Goal 4 - Progress (Week 2): Met  New Short Term Goals:  Week 3: SLP Short Term Goal 1 (Week 3): Pt will demonstrate sustained attention to a functional task for 5 minutes with Mod A verbal cues for redirection.  SLP Short Term Goal 2 (Week 3): Pt will demonstrate efficient mastication of regular textures with Mod verbal cues.  SLP Short Term Goal 3 (Week 3): Pt will consume Dys. 3 textures and thin liquids with Mod A verbal cues for utilization of swallowing compensatory strategies.  SLP Short Term Goal 4 (Week 3): Pt will utilize external memory aids to orient to place, time and situation with Mod A verbal and visual cues.  SLP Short Term Goal 5 (Week 3): Pt will demonstrate functional problem solving with Max A multimodal cueing SLP Short Term Goal 6 (Week 3): Pt will identify 1 physical deficit with Max A mutlimodal cueing   Weekly Progress Updates: Pt has made functional gains and has met 3 out of  4 STG's this reporting period. Currently, pt is tolerating Dys. 3 textures and thin liquids with Mod-Max verbal cueing needed for sustained attention to meal and utilization of swallowing compensatory strategies. Pt continues to demonstrate behaviors consistent with a Rancho Level V and is overall Max-Total A for sustained attention, orientation, problem solving, safety awareness and working memory and overall Mod-Max A for following 1 step commands and verbal expression. Pt would benefit from continued skilled SLP intervention to maximize functional communication, swallowing function, cognitive function and overall independence.    SLP Frequency: 1-2 X/day, 30-60 minutes Estimated Length of Stay: TBD SLP Treatment/Interventions: Cognitive remediation/compensation;Cueing hierarchy;DME/adaptive equipment instruction;Functional tasks;Environmental controls;Dysphagia/aspiration precaution training;Internal/external aids;Speech/Language facilitation;Therapeutic Activities;Patient/family education  Daily Session Skilled Therapeutic Intervention: Treatment focus on sustained attention, problem solving and working memory. Pt sustained attention to functional calendar task for ~5 minutes with Min verbal and visual cues for redirection. Pt demonstrated Max A multimodal cueing for functional problem solving and for following 1 step commands. Pt required total A for functional problem solving and safety awareness due to impulsivity when needing to use the commode.  FIM:  Comprehension Comprehension Mode: Auditory Comprehension: 2-Understands basic 25 - 49% of the time/requires cueing 51 - 75% of the time Expression Expression Mode: Verbal Expression: 2-Expresses basic 25 - 49% of the time/requires cueing 50 - 75% of the time. Uses single words/gestures. Social Interaction Social Interaction: 2-Interacts appropriately 25 - 49% of time - Needs frequent redirection.  Problem Solving Problem Solving: 1-Solves  basic less than 25% of the time - needs direction nearly all the time or does not effectively solve problems and may need a restraint for safety Memory Memory: 1-Recognizes or recalls less than 25% of the time/requires cueing greater than 75% of the time Pain No/Denies Pain  Therapy/Group: Individual Therapy  Rilee Knoll 03/18/2012, 3:07 PM

## 2012-03-18 NOTE — Progress Notes (Signed)
Physical Therapy Weekly Progress Note  Patient Details  Name: Stephen Buckley MRN: [id number] Date of Birth: 1951/01/16  Today's Date: 03/18/2012  Patient has met 1 of 3 short term goals.  Pt continues with inconsistencies in transfers (max-total A) and attention (5 seconds - 3 minutes).  Pt with more periods of improved attention and is not resisting sliding board transfers but is not assisting with transfers.  Patient continues to demonstrate the following deficits: impaired attention, awareness, impaired functional mobility and therefore will continue to benefit from skilled PT intervention to enhance overall performance with activity tolerance, ability to compensate for deficits, attention, awareness and knowledge of precautions.  Patient not progressing toward long term goals.  See goal revision..  Continue plan of care.  PT Short Term Goals Week 2:  PT Short Term Goal 1 (Week 2): Pt will perform bed <> chair transfers with max A PT Short Term Goal 1 - Progress (Week 2): Progressing toward goal PT Short Term Goal 2 (Week 2): Pt will attend to functional task x 60 seconds with min cuing PT Short Term Goal 2 - Progress (Week 2): Met PT Short Term Goal 3 (Week 2): Pt will follow 1 step commands for functional activities with mod cuing PT Short Term Goal 3 - Progress (Week 2): Progressing toward goal  Skilled Therapeutic Interventions/Progress Updates:  Community reintegration;Discharge planning;Balance/vestibular training;Cognitive remediation/compensation;Therapeutic Activities;UE/LE Coordination activities;Wheelchair propulsion/positioning;UE/LE Strength taining/ROM;Therapeutic Exercise;Neuromuscular re-education;DME/adaptive equipment instruction;Pain management;Patient/family education;Functional mobility training;Splinting/orthotics    See FIM for current functional status   [name] 03/18/2012, 2:52 PM

## 2012-03-18 NOTE — Progress Notes (Signed)
Occupational Therapy Session Note  Patient Details  Name: Stephen Buckley MRN: [id number] Date of Birth: 06-05-1951  Today's Date: 03/18/2012  Session 1 Time: 1115-1200 Time Calculation (min): 45 min  Short Term Goals: Week 2:  OT Short Term Goal 1 (Week 2): Patient will sustain attention for 5 min during a self feeding or grooming task with min cueing  OT Short Term Goal 2 (Week 2): Patient will assist with sliding board transfer to drop arm commode with max assist  OT Short Term Goal 3 (Week 2): Patient will indicate personal need to staff or girlfriend 50% time, e.g. toileting, temperature, hunger  OT Short Term Goal 4 (Week 2): Patient will dress lower body with maximal assistance   Skilled Therapeutic Interventions/Progress Updates:    Pt sitting in recliner upon arrival.  Pt's girlfriend [name]) was cleaning up floor where pt had urinated.  Pt performed sliding board transfer recliner>w/c with min A to engage in bathing and dressing w/c level at sink.  Pt required max verbal cues and gestures to initiate task.  Pt continued to wash face when presented with wash cloth.  Pt eventually bathed trunk and arms.  Pt donned shirt when presented with shirt.  Pt threaded bilateral pants legs but attempted to stand up to pull them up and had to be physically restrained.  Pt did boost bottom off chair enough for therapist to pull up pants.  Pt engaged in tangential conversation with therapist often referring to therapist as a friend of his.  Focus on attention to task, initiation of functional tasks, safety awareness, and orientation.  Therapy Documentation Precautions:  Precautions Precautions: Fall Restrictions Weight Bearing Restrictions: Yes RLE Weight Bearing: Non weight bearing LLE Weight Bearing: Non weight bearing   Pain: Pain Assessment Pain Assessment: No/denies pain  See FIM for current functional status  Therapy/Group: Individual Therapy  Session 2 Time: 1300-1330 Pt  denies pain Individual Therapy Attempted to engage patient in therapeutic activity with focus on attention to task in a minimally distractive environment.  Pt indicated he needed to use bathroom immediately.  Pt assisted to bathroom and patient attempted to stand up to urinate.  Pt had to be physically restrained from standing up.  Pt transitioned to gym to continue activity but patient unable to attend to task secondary to environmental distractions in gym.  Pt rolled back to day room with max verbal cues to attend to task.    [name] [facility] 03/18/2012, 12:08 PM

## 2012-03-18 NOTE — Discharge Summary (Signed)
Clotilde Loth, MD, MPH, FACS Pager: [phone]  

## 2012-03-18 NOTE — Plan of Care (Signed)
Problem: RH SKIN INTEGRITY Goal: RH STG ABLE TO PERFORM INCISION/WOUND CARE W/ASSISTANCE STG Able To Perform Incision/Wound Care With mod Assistance.  Outcome: Not Progressing Poor safety awareness

## 2012-03-18 NOTE — Progress Notes (Signed)
Physical Therapy Note  Patient Details  Name: Stephen Buckley MRN: [id number] Date of Birth: 1950-09-23 Today's Date: 03/18/2012  Time: 1330-1430 60 minutes  No c/o pain.  Pt initially required max-total A to attend to therapist and participate in functional therapy.  Pt participated in playing tennis w/c level with improved attention to task noted, able to attend 1-2 minutes before requiring cues.  Pt with improved coordination throughout session, able to hit tennis ball with racquet when tossed to him.  Pt performed  UBE 3 mins, 4 mins with min cuing for attention, quick cadence, level 2.  Pt propelled w/c throughout unit with supervision, cues for attention to task every 50'.  Card game with pt unable to follow directions to participate in game, pt then began playing "solitare" with min-mod cues for set up, then able to play correctly with min-mod cues from PT.  Overall pt with decreased agitation and improved attention today.  Individual therapy    Aleyna Cueva 03/18/2012, 2:33 PM
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
## 2012-03-19 ENCOUNTER — Inpatient Hospital Stay (HOSPITAL_COMMUNITY): Payer: Medicaid Other | Admitting: Speech Pathology

## 2012-03-19 ENCOUNTER — Inpatient Hospital Stay (HOSPITAL_COMMUNITY): Payer: Medicaid Other | Admitting: Physical Therapy

## 2012-03-19 ENCOUNTER — Inpatient Hospital Stay (HOSPITAL_COMMUNITY): Payer: Medicaid Other

## 2012-03-19 DIAGNOSIS — Z5189 Encounter for other specified aftercare: Secondary | ICD-10-CM

## 2012-03-19 DIAGNOSIS — S72309A Unspecified fracture of shaft of unspecified femur, initial encounter for closed fracture: Secondary | ICD-10-CM

## 2012-03-19 DIAGNOSIS — IMO0002 Reserved for concepts with insufficient information to code with codable children: Secondary | ICD-10-CM

## 2012-03-19 DIAGNOSIS — S82109A Unspecified fracture of upper end of unspecified tibia, initial encounter for closed fracture: Secondary | ICD-10-CM

## 2012-03-19 DIAGNOSIS — S069X9A Unspecified intracranial injury with loss of consciousness of unspecified duration, initial encounter: Secondary | ICD-10-CM

## 2012-03-19 MED ORDER — TRAZODONE HCL 50 MG PO TABS
50.0000 mg | ORAL_TABLET | Freq: Four times a day (QID) | ORAL | Status: DC | PRN
  Administered 2012-03-20 (×2): 50 mg via ORAL
  Filled 2012-03-19: qty 1
  Filled 2012-03-19: qty 4
  Filled 2012-03-19: qty 1

## 2012-03-19 NOTE — Progress Notes (Signed)
Social Work Patient ID: Stephen Buckley, male   DOB: [birth year], 61 y.o.   MRN: [id number]  Met with patient's girlfriend this morning to review team conference. Aware and agreeable with extension of stay x one week due to continued behavioral issues (i.e. Still in restraints) and inability to maintain WB restrictions.  MD to follow up with ortho about WB restrictions and whether anticipated to change?)  Again, discussed with gf about her thoughts on whether she feels she can manage pt's care alone at home if little change in this next week vs. Does she feel she needs to pursue placement.  She feels that "... I can handle the mouth, that's no problem... It's just gonna be hard to keep him off of his legs".  She is aware that MD is asking the questions about WB.  Will stay in touch with her over next few days and monitor changes with pt.  Leianna Barga

## 2012-03-19 NOTE — Plan of Care (Signed)
Problem: RH SKIN INTEGRITY Goal: RH STG ABLE TO PERFORM INCISION/WOUND CARE W/ASSISTANCE STG Able To Perform Incision/Wound Care With mod Assistance.  Outcome: Not Progressing Patient with poor safety awareness  Problem: RH SAFETY Goal: RH STG ADHERE TO SAFETY PRECAUTIONS W/ASSISTANCE/DEVICE STG Adhere to Safety Precautions With mod Assistance/Device.  Outcome: Not Progressing Patient attempting to get out of bed without staff assistance.
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
## 2012-03-19 NOTE — Progress Notes (Signed)
Speech Language Pathology Daily Session Note  Patient Details  Name: Stephen Buckley MRN: [id number] Date of Birth: [birth year]  Today's Date: 03/19/2012 Time: 1000-1045 Time Calculation (min): 45 min  Short Term Goals: Week 3: SLP Short Term Goal 1 (Week 3): Pt will demonstrate sustained attention to a functional task for 5 minutes with Mod A verbal cues for redirection.  SLP Short Term Goal 2 (Week 3): Pt will demonstrate efficient mastication of regular textures with Mod verbal cues.  SLP Short Term Goal 3 (Week 3): Pt will consume Dys. 3 textures and thin liquids with Mod A verbal cues for utilization of swallowing compensatory strategies.  SLP Short Term Goal 4 (Week 3): Pt will utilize external memory aids to orient to place, time and situation with Mod A verbal and visual cues.  SLP Short Term Goal 5 (Week 3): Pt will demonstrate functional problem solving with Max A multimodal cueing SLP Short Term Goal 6 (Week 3): Pt will identify 1 physical deficit with Max A mutlimodal cueing   Skilled Therapeutic Interventions: Treatment focus on orientation, following directions and turn taking. Pt required total A multimodal cueing for safety awareness during transfer from bed to recliner.  Pt participated in game with focus on auditory comprehension of rules of task and turn taking. Pt required Max multimodal cueing for turn taking with the task and total A for problem solving/comprehension to rules of game. Pt required total A multimodal cueing for sustained attention to task for ~1 minute with increased verbosity today.    FIM:  Comprehension Comprehension Mode: Auditory Comprehension: 2-Understands basic 25 - 49% of the time/requires cueing 51 - 75% of the time Expression Expression: 2-Expresses basic 25 - 49% of the time/requires cueing 50 - 75% of the time. Uses single words/gestures. Social Interaction Social Interaction: 1-Interacts appropriately less than 25% of the time. May be  withdrawn or combative. Problem Solving Problem Solving: 1-Solves basic less than 25% of the time - needs direction nearly all the time or does not effectively solve problems and may need a restraint for safety Memory Memory: 1-Recognizes or recalls less than 25% of the time/requires cueing greater than 75% of the time  Pain Pain Assessment Pain Assessment: No/denies pain  Therapy/Group: Individual Therapy  Daniqua Campoy 03/19/2012, 3:19 PM
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
## 2012-03-19 NOTE — Progress Notes (Signed)
Occupational Therapy Note  Patient Details  Name: Stephen Buckley MRN: [id number] Date of Birth: [birth year] Today's Date: 03/19/2012  Session 1 Time: 1100-1145 Pt denies pain Individual Therapy  Pt seated in recliner exhibiting behaviors consistent with restlessness.  Attempted to engage patient in bathing and dressing tasks while seated in recliner.  Pt incontinent of bladder and transferred to bed to complete LB bathing and dressing tasks.  Pt required max A for transfer with max verbal cues to redirect patient to task.  Pt rolled in bed upon request to allow therapist to remove brief and don new brief.  Pt washed perineal area when presented with wash cloth.  Pt sat EOB and was persented with written statement to assist patient with orientation.  When patient read that he was in [facility] he immediately started sobbing and crying and could not continue reading for several minutes.  Therapist offered emotional support and patient continued reading statement with intermittent periods of crying.  Pt assisted in laying back in bed.  Pt unable to continue secondary to emotional outbursts.  Pt missed 15 mins skilled OT services.  Session 2 Time: 1400-1430 Pt denies pain Individual Therapy Attempted to engage patient in self care tasks seated EOB.  Pt would not actively engage in grooming activities instead carrying on confabulatory conversation with therapist.  When presented with was cloth patient would lay it to the side and continue with conversation.  Pt intermittently referred to patient with correct name but not consistently.  Pt reluctant to get OOB and actively resisted.  [name] [facility] 03/19/2012, 2:34 PM

## 2012-03-20 ENCOUNTER — Inpatient Hospital Stay (HOSPITAL_COMMUNITY): Payer: Medicaid Other

## 2012-03-20 ENCOUNTER — Inpatient Hospital Stay (HOSPITAL_COMMUNITY): Payer: Medicaid Other | Admitting: Speech Pathology

## 2012-03-20 ENCOUNTER — Inpatient Hospital Stay (HOSPITAL_COMMUNITY): Payer: Medicaid Other | Admitting: Physical Therapy

## 2012-03-20 MED ORDER — TRAZODONE HCL 50 MG PO TABS
200.0000 mg | ORAL_TABLET | Freq: Every day | ORAL | Status: DC
  Administered 2012-03-21 – 2012-03-26 (×5): 200 mg via ORAL
  Filled 2012-03-20: qty 2
  Filled 2012-03-20: qty 4
  Filled 2012-03-20: qty 2
  Filled 2012-03-20 (×2): qty 4

## 2012-03-20 NOTE — Plan of Care (Signed)
Problem: RH BLADDER ELIMINATION Goal: RH STG MANAGE BLADDER WITH ASSISTANCE STG Manage Bladder With Mod Assistance  Outcome: Progressing Patient incontinent of urine at hs. Patient has urgency and voids in urinal at times continent.

## 2012-03-20 NOTE — Progress Notes (Signed)
Occupational Therapy Weekly Progress Note  Patient Details  Name: Stephen Buckley MRN: [id number] Date of Birth: 17-Mar-1951  Today's Date: 03/20/2012  Patient has met 2 of 4 short term goals.  Pt progress this past week continues to be inconsistent.  Pt consistently requires tot A for orientation and max verbal cues to engage in self care tasks.  Pt continues to exhibit urgency when needing to use bathroom and often becomes agitated when not able to stand to go to bathroom.  Pt consistently requires max to tot A for sliding board transfers but has occasionally performed transfer with min A.  Pt requires max verbal cues to redirect conversation and attention to tasks.    Patient continues to demonstrate the following deficits: swelling in bilateral LEs, decreased motor planning, decreased attention, decreased awareness, decreased problem solving, decreased safety awareness and decreased memory and difficulty maintaining precautions and therefore will continue to benefit from skilled OT intervention to enhance overall performance with BADL. Pt's LOS has been extended another week in hopes he would cognitively clear (with increased awareness and attention) to be safe to transition home with girlfriend. Pt's behavior is still consistent with [place] Level [place].  Patient progressing toward long term goals..  Continue plan of care.  OT Short Term Goals Week 2:  OT Short Term Goal 1 (Week 2): Patient will sustain attention for 5 min during a self feeding or grooming task with min cueing  OT Short Term Goal 1 - Progress (Week 2): Progressing toward goal OT Short Term Goal 2 (Week 2): Patient will assist with sliding board transfer to drop arm commode with max assist  OT Short Term Goal 2 - Progress (Week 2): Met OT Short Term Goal 3 (Week 2): Patient will indicate personal need to staff or girlfriend 50% time, e.g. toileting, temperature, hunger  OT Short Term Goal 3 - Progress (Week 2): Met OT Short Term  Goal 4 (Week 2): Patient will dress lower body with maximal assistance  OT Short Term Goal 4 - Progress (Week 2): Progressing toward goal Week 3:  OT Short Term Goal 1 (Week 3): Patient will sustain attention for 5 min during a self feeding or grooming task with min cueing  OT Short Term Goal 2 (Week 3): Patient will dress lower body with maximal assistance  OT Short Term Goal 3 (Week 3): Pt will consistently (>50%) perform sliding board transfers with min A  Skilled Therapeutic Interventions/Progress Updates:    continued POC  Therapy Documentation Precautions:  Precautions Precautions: Fall Restrictions Weight Bearing Restrictions: Yes RLE Weight Bearing: Non weight bearing LLE Weight Bearing: Non weight bearing ADL: ADL Grooming: Supervision/safety;Maximal cueing Where Assessed-Grooming: Sitting at sink;Wheelchair Upper Body Bathing: Supervision/safety;Maximal cueing Where Assessed-Upper Body Bathing: Sitting at sink;Wheelchair Lower Body Bathing: Dependent Where Assessed-Lower Body Bathing: Bed level Upper Body Dressing: Supervision/safety;Moderate cueing Where Assessed-Upper Body Dressing: Edge of bed Lower Body Dressing: Dependent Where Assessed-Lower Body Dressing: Bed level Toileting: Maximal cueing;Maximal assistance Where Assessed-Toileting: Teacher, adult education: Maximal Dentist Method: Scientist, research (life sciences): Drop arm bedside commode  See FIM for current functional status  Therapy/Group: Individual Therapy  Rich Brave 03/20/2012, 2:51 PM

## 2012-03-20 NOTE — Progress Notes (Signed)
Speech Language Pathology Daily Session Note  Patient Details  Name: Stephen Buckley MRN: [id number] Date of Birth: [birth year]  Today's Date: 03/20/2012 Time: 4098-1191 Time Calculation (min): 45 min  Short Term Goals: Week 3: SLP Short Term Goal 1 (Week 3): Pt will demonstrate sustained attention to a functional task for 5 minutes with Mod A verbal cues for redirection.  SLP Short Term Goal 2 (Week 3): Pt will demonstrate efficient mastication of regular textures with Mod verbal cues.  SLP Short Term Goal 3 (Week 3): Pt will consume Dys. 3 textures and thin liquids with Mod A verbal cues for utilization of swallowing compensatory strategies.  SLP Short Term Goal 4 (Week 3): Pt will utilize external memory aids to orient to place, time and situation with Mod A verbal and visual cues.  SLP Short Term Goal 5 (Week 3): Pt will demonstrate functional problem solving with Max A multimodal cueing SLP Short Term Goal 6 (Week 3): Pt will identify 1 physical deficit with Max A mutlimodal cueing   Skilled Therapeutic Interventions: Treatment focus on following directions, reasoning and orientation.  Pt required total A multimodal cueing for orientation to time, place and situation and overall Max A for following 1 step commands. Pt required total A for reasoning task to identify item from field of 4 that does not belong with the others.  Pt propelled wheelchair independently.    FIM:  Comprehension Comprehension Mode: Auditory Comprehension: 2-Understands basic 25 - 49% of the time/requires cueing 51 - 75% of the time Expression Expression Mode: Verbal Expression: 2-Expresses basic 25 - 49% of the time/requires cueing 50 - 75% of the time. Uses single words/gestures. Social Interaction Social Interaction: 2-Interacts appropriately 25 - 49% of time - Needs frequent redirection. Problem Solving Problem Solving: 1-Solves basic less than 25% of the time - needs direction nearly all the time or does  not effectively solve problems and may need a restraint for safety Memory Memory: 1-Recognizes or recalls less than 25% of the time/requires cueing greater than 75% of the time  Pain Pain Assessment Pain Assessment: No/denies pain  Therapy/Group: Individual Therapy  Domanick Cuccia 03/20/2012, 4:13 PM

## 2012-03-20 NOTE — Progress Notes (Signed)
Occupational Therapy Note  Patient Details  Name: Stephen Buckley MRN: [id number] Date of Birth: 1950-12-15 Today's Date: 03/20/2012  Time: 1300-1330 Pt denies pain Individual Therapy Pt sitting in w/c upon arrival.  Pt rolled to dayroom with min A and max verbal cues to attend to task.  Pt engaged in assembling structures using PVC piping and following illustrations.  Pt required max verbal cues and min tactile cues to initiate task and attend to task through completion.  Pt's conversation throughout session was tangential and often confabulatory.   [name] [facility] 03/20/2012, 3:51 PM
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
## 2012-03-20 NOTE — Progress Notes (Signed)
Occupational Therapy Note  Patient Details  Name: Stephen Buckley MRN: [id number] Date of Birth: Apr 05, 1951 Today's Date: 03/20/2012  Time: 4098-1191 Pt denies pain Individual therapy  Pt in recliner upon arrival.  Attempted to engage patient in grooming activities at sink without success.  Pt required max verbal cues for redirection.  Pt performed sliding board transfer recliner>w/c with min A and rolled to day room.  In day room patient participated in game of "War" with cards and was 100% accurate in determining which card had the highest value and won the hand.  Pt became distracted at the end of the game and was unable to correctly count the number of cards in his hand and determine who won the game.  Pt participated in game of solitaire after cards dealt in correct layout.  Pt sustained attention for approx 15 mins in a minimally distractive environment.  Pt indicated he needed to use toilet and was rolled back to bathroom.  Once in bathroom patient attempted to stand and required tot A for transfer to toilet.  Pt again attempted to stand from toilet when he finished voiding and required tot A for clothing management and transfer back to w/c.  Focus on attention to task, task initiation, safety awareness, and transfers.   [name] [facility] 03/20/2012, 11:44 AM

## 2012-03-20 NOTE — Progress Notes (Signed)
Physical Therapy Note  Patient Details  Name: Stephen Buckley MRN: [id number] Date of Birth: 09/11/1950 Today's Date: 03/20/2012  Time: 1400-1500 60 minutes  No c/o pain.  Pt reports need to use restroom, scoot transferred to toilet with +2 assist for safety and maintaining precautions.  Pt refused to pull down pants and was incontinent.  Pt becoming increasingly agitated unable to follow directions from PT to safely transfer back to chair.  Pt required 10 minutes to become less agitated and safely transfer to w/c.  Pt required max A to don new brief, supervision for putting pants on, pt then refused to pull pants up.  Pt performed w/c mobility throughout unit with supervision, continuing to refuse to pull up pants.  When presented with option of going outside pt willing to pull pants up with max A for lateral leaning.  W/c mobility outside with min A due to decreased attention, w/c mobility on various surfaces with close supervision to min A.  Pt continues with decreased focused attention requiring max-total cuing to follow directions to complete trip outside safely.  Individual therapy   Rema Lievanos 03/20/2012, 4:14 PM

## 2012-03-21 ENCOUNTER — Inpatient Hospital Stay (HOSPITAL_COMMUNITY): Payer: Medicaid Other | Admitting: Physical Therapy

## 2012-03-21 ENCOUNTER — Inpatient Hospital Stay (HOSPITAL_COMMUNITY): Payer: Medicaid Other | Admitting: Speech Pathology

## 2012-03-21 ENCOUNTER — Inpatient Hospital Stay (HOSPITAL_COMMUNITY): Payer: Medicaid Other

## 2012-03-21 DIAGNOSIS — S069X9A Unspecified intracranial injury with loss of consciousness of unspecified duration, initial encounter: Secondary | ICD-10-CM

## 2012-03-21 DIAGNOSIS — Z5189 Encounter for other specified aftercare: Secondary | ICD-10-CM

## 2012-03-21 DIAGNOSIS — S82109A Unspecified fracture of upper end of unspecified tibia, initial encounter for closed fracture: Secondary | ICD-10-CM

## 2012-03-21 DIAGNOSIS — IMO0002 Reserved for concepts with insufficient information to code with codable children: Secondary | ICD-10-CM

## 2012-03-21 DIAGNOSIS — S72309A Unspecified fracture of shaft of unspecified femur, initial encounter for closed fracture: Secondary | ICD-10-CM

## 2012-03-21 LAB — COMPREHENSIVE METABOLIC PANEL
Albumin: 3.4 g/dL — ABNORMAL LOW (ref 3.5–5.2)
BUN: 10 mg/dL (ref 6–23)
CO2: 27 mEq/L (ref 19–32)
Calcium: 10.1 mg/dL (ref 8.4–10.5)
Chloride: 105 mEq/L (ref 96–112)
Creatinine, Ser: 0.79 mg/dL (ref 0.50–1.35)
GFR calc non Af Amer: >90 mL/min (ref >90)
Total Bilirubin: 0.6 mg/dL (ref 0.3–1.2)

## 2012-03-21 LAB — CBC
HCT: 38.8 % — ABNORMAL LOW (ref 39.0–52.0)
MCH: 30.6 pg (ref 26.0–34.0)
MCV: 89.8 fL (ref 78.0–100.0)
Platelets: 253 10*3/uL (ref 150–400)
RDW: 14.9 % (ref 11.5–15.5)
WBC: 7.2 10*3/uL (ref 4.0–10.5)

## 2012-03-21 NOTE — Progress Notes (Signed)
Speech Language Pathology Daily Session Note  Patient Details  Name: Stephen Buckley MRN: [id number] Date of Birth: 11-14-1950  Today's Date: 03/21/2012 Time: 1115-1200 Time Calculation (min): 45 min  Short Term Goals: Week 3: SLP Short Term Goal 1 (Week 3): Pt will demonstrate sustained attention to a functional task for 5 minutes with Mod A verbal cues for redirection.  SLP Short Term Goal 2 (Week 3): Pt will demonstrate efficient mastication of regular textures with Mod verbal cues.  SLP Short Term Goal 3 (Week 3): Pt will consume Dys. 3 textures and thin liquids with Mod A verbal cues for utilization of swallowing compensatory strategies.  SLP Short Term Goal 4 (Week 3): Pt will utilize external memory aids to orient to place, time and situation with Mod A verbal and visual cues.  SLP Short Term Goal 5 (Week 3): Pt will demonstrate functional problem solving with Max A multimodal cueing SLP Short Term Goal 6 (Week 3): Pt will identify 1 physical deficit with Max A mutlimodal cueing   Skilled Therapeutic Interventions: Treatment focused on basic-level attention, following verbal directions, orientation to time/place with use of visual aids. Patient required maximal verbal cues to redirect attention to task, as well as to reduce incidence of perseverative thoughts and actions. Patient utilized visual aids to orient self to time and situation, and he did demonstrate awareness to being in a hospital. Topic maintainance was very poor, with patient exhibiting tangential and perseverative verbal expression, requiring frequent cues to redirect. Clinician provided verbal instruction and demonstration cues for task to sort playing cards by their number. Patient was successful at this task, maintaining focused and selective attention for approximately 3  minutes, with clinician providing intermittent verbal distractions.    FIM:  Comprehension Comprehension Mode: Auditory Comprehension:  3-Understands basic 50 - 74% of the time/requires cueing 25 - 50%  of the time Expression Expression Mode: Verbal Expression: 3-Expresses basic 50 - 74% of the time/requires cueing 25 - 50% of the time. Needs to repeat parts of sentences. Social Interaction Social Interaction: 2-Interacts appropriately 25 - 49% of time - Needs frequent redirection. Problem Solving Problem Solving: 1-Solves basic less than 25% of the time - needs direction nearly all the time or does not effectively solve problems and may need a restraint for safety Memory Memory: 2-Recognizes or recalls 25 - 49% of the time/requires cueing 51 - 75% of the time  Pain Pain Assessment Pain Score: 0-No pain Faces Pain Scale: No hurt  Therapy/Group: Individual Therapy  Pablo Lawrence 03/21/2012, 4:47 PM  Angela Nevin, MA, CCC-SLP [facility] Speech-Language Pathologist
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
## 2012-03-21 NOTE — Progress Notes (Signed)
Physical Therapy Note  Patient Details  Name: Stephen Buckley MRN: [id number] Date of Birth: 1951/06/23 Today's Date: 03/21/2012  Time: 1300-1400 60 minutes  No c/o pain.  Pt self propelled w/c with supervision throughout unit, varying from supervision to total A cuing to follow directions where he should go. Pt able to make a grocery list with 4 items with supervision cuing, then became max A for cuing to complete list.  Pt asked to water flowers.  Pt able to water 3 flowers with mod-max verbal and gesturing cues for initiation, sequencing.  Sorting task sorting cards into 4 piles based on color.  Pt initially total A, then progressed to supervision with repetition.  Pt mainly limited by poor sustained attention which limits performance with all functional and therapeutic tasks.  Individual therapy  [name] 03/21/2012, 2:44 PM

## 2012-03-21 NOTE — Progress Notes (Signed)
Occupational Therapy Session Note  Patient Details  Name: Stephen Buckley MRN: [id number] Date of Birth: [birth year]  Today's Date: 03/21/2012 Time: 0900-1000 Time Calculation (min): 60 min  Short Term Goals: Week 3:  OT Short Term Goal 1 (Week 3): Patient will sustain attention for 5 min during a self feeding or grooming task with min cueing  OT Short Term Goal 2 (Week 3): Patient will dress lower body with maximal assistance  OT Short Term Goal 3 (Week 3): Pt will consistently (>50%) perform sliding board transfers with min A  Skilled Therapeutic Interventions/Progress Updates:    Pt in bed covered with bed spread upon arrival.  Pt reluctant to remove covers and sit EOB in preparation for bathing and dressing tasks.  Pt required max verbal cues and extra time (approx 20 mins) to sit EOB with supervision.  When therapist attempted to provide physical assistance/tactile cues pt would become verbally agitated.  Pt transferred to rolling shower chair with tot A and rolled into walk-in shower.  Pt exhibited increased agitation when water turned on and attempted to stand up X 3 in shower.  Pt had to be physically restrained from standing up.  Pt rolled out into room and patient stated he needed to have a bowel movement.  Toilet transfer at tot A.  While sitting on toilet pt became emotional and resisted any assistance provided for toilet hygiene and donning pants. RN notified of pt's increased agitation and meds administered. Pt required tot A + 2 for cleaning after bowel movement, donning pants, donning shirt, and transfer to w/c.  Pt continued to exhibit heightened emotions throughout later part of session.  Therapy Documentation Precautions:  Precautions Precautions: Fall Restrictions Weight Bearing Restrictions: Yes RLE Weight Bearing: Non weight bearing LLE Weight Bearing: Non weight bearing  Pain: Pain Assessment Pain Assessment: No/denies pain Pain Score: 0-No pain Faces Pain Scale:  No hurt  See FIM for current functional status  Therapy/Group: Individual Therapy  Rich Brave 03/21/2012, 10:09 AM

## 2012-03-21 NOTE — Progress Notes (Deleted)
1000 Pt. Consumed only 1/3 of morning medication with pudding and Nectar thickened juice. Refused last 2/3rds of medication. Pt. States "I will vomit if I take anymore" Will continue to encourage remaining medication intake. PA notified.  

## 2012-03-21 NOTE — Progress Notes (Signed)
Occupational Therapy Note  Patient Details  Name: Stephen Buckley MRN: [id number] Date of Birth: 1951/05/12 Today's Date: 03/21/2012  Time: 1400-1430 Pt denies pain Individual Therapy Pt in w/c and initially agreeable to shaving at sink.  Pt initiated shaving and then stated he needed to use toilet and began to stand up.  Pt had to be physically restrained from standing up and required tot A for toilet transfer.  Pt became verbally agitated because he wanted to be alone in bathroom.  After bowel movement pt again became agitated because he wanted to stand up and walk into the bedroom.  Pt had to be physically restrained and clothing management and transfers required tot A + 2.  Pt required tot A to complete shaving at sink.   [name] [facility] 03/21/2012, 2:49 PM

## 2012-03-21 NOTE — Progress Notes (Signed)
Nutrition Follow-up  Intervention: Continue current interventions.  Assessment:   Per conference note, pt with continued behavioral issues and needing additional time in rehab. Remains on Dysphagia 3 diet with thin liquids. Continues to consume 75 - 100% of meals.  Medications and labs reviewed. Megace discontinued on 9/24.  Diet Order:  Dysphagia 3 with Thin Liquids Supplement: Ensure Pudding PO QID  Meds: Scheduled Meds:    . carbamazepine  200 mg Oral TID  . feeding supplement  1 Container Oral QID  . oxyCODONE  10 mg Oral Q12H  . traZODone  200 mg Oral QHS   Continuous Infusions:  PRN Meds:.acetaminophen, HYDROcodone-acetaminophen, LORazepam, LORazepam, ondansetron (ZOFRAN) IV, ondansetron, polyethylene glycol, RESOURCE THICKENUP CLEAR, sorbitol, traZODone  Labs:  CMP     Component Value Date/Time   NA 142 03/21/2012 0952   K 3.6 03/21/2012 0952   CL 105 03/21/2012 0952   CO2 27 03/21/2012 0952   GLUCOSE 99 03/21/2012 0952   BUN 10 03/21/2012 0952   CREATININE 0.79 03/21/2012 0952   CALCIUM 10.1 03/21/2012 0952   PROT 7.6 03/21/2012 0952   ALBUMIN 3.4* 03/21/2012 0952   AST 40* 03/21/2012 0952   ALT 33 03/21/2012 0952   ALKPHOS 223* 03/21/2012 0952   BILITOT 0.6 03/21/2012 0952   GFRNONAA >90 03/21/2012 0952   GFRAA >90 03/21/2012 0952     Intake/Output Summary (Last 24 hours) at 03/21/12 1350 Last data filed at 03/21/12 1300  Gross per 24 hour  Intake    240 ml  Output      0 ml  Net    240 ml  BM 9/23  Weight Status:  116 lb - wt now stabilizing  Estimated needs:  1875 - 2075 kcal, 88 - 100 grams protein  Nutrition Dx: Inadequate oral intake r/t variable attention AEB ongoing weight loss and variable intake. Improving.  Goal: Pt to meet >/= 90% of their estimated nutrition needs; improving  Monitor: weight trends, lab trends, I/O's, PO intake, supplement tolerance  Jarold Motto MS, RD, LDN Pager: [phone] After-hours pager: [phone]

## 2012-03-22 ENCOUNTER — Inpatient Hospital Stay (HOSPITAL_COMMUNITY): Payer: Self-pay | Admitting: Speech Pathology

## 2012-03-22 ENCOUNTER — Inpatient Hospital Stay (HOSPITAL_COMMUNITY): Payer: Medicaid Other | Admitting: Physical Therapy

## 2012-03-22 NOTE — Progress Notes (Signed)
Physical Therapy Note  Patient Details  Name: Stephen Buckley MRN: [id number] Date of Birth: July 18, 1950 Today's Date: 03/22/2012  1030-1055 (25 minutes) individual Pain: no reported pain Focus of treatment: Therapeutic activity focused on improving orientation Treatment: Orientation - person only; pt refused to get out of bed; agreed to participated in supine bilatral LE AROM/strengthenign X 15 ; orientation questioning - days of week, months of year (able to verbalize with min questioning cues) ; holidays ( unable to recall months of holidays) .   [name],JIM 03/22/2012, 8:10 AM

## 2012-03-22 NOTE — Progress Notes (Addendum)
Speech Language Pathology Daily Session Note  Patient Details  Name: Stephen Buckley MRN: [id number] Date of Birth: [birth year]  Today's Date: 03/22/2012 Time: 1435-1500 Time Calculation (min): 25 min  Short Term Goals: Week 3: SLP Short Term Goal 1 (Week 3): Pt will demonstrate sustained attention to a functional task for 5 minutes with Mod A verbal cues for redirection.  SLP Short Term Goal 1 - Progress (Week 3): Progressing toward goal SLP Short Term Goal 2 (Week 3): Pt will demonstrate efficient mastication of regular textures with Mod verbal cues.  SLP Short Term Goal 2 - Progress (Week 3): Progressing toward goal SLP Short Term Goal 3 (Week 3): Pt will consume Dys. 3 textures and thin liquids with Mod A verbal cues for utilization of swallowing compensatory strategies.  SLP Short Term Goal 3 - Progress (Week 3): Progressing toward goal SLP Short Term Goal 4 (Week 3): Pt will utilize external memory aids to orient to place, time and situation with Mod A verbal and visual cues.  SLP Short Term Goal 4 - Progress (Week 3): Progressing toward goal SLP Short Term Goal 5 (Week 3): Pt will demonstrate functional problem solving with Max A multimodal cueing SLP Short Term Goal 5 - Progress (Week 3): Progressing toward goal SLP Short Term Goal 6 (Week 3): Pt will identify 1 physical deficit with Max A mutlimodal cueing   Skilled Therapeutic Interventions: Pt. seen for cognitve reorganization therapy.  Pt. was uncooperative with SLP's targeted/planned activity, however pt. agreed to consume a soda.  SLP requested pt. read ingredients/nutrition info printed on can and pt. sustained attention for 8-10 minutes!  He also demonstrated alternating attention when visitor entered room and he said "hi" and returned to reading the can.  Pt. exhibited perseverations during task without evidence of awareness.  Pt. asked SLP for the time then proceeded to use clock independently to state time.     FIM:    Comprehension Comprehension Mode: Auditory Comprehension: 2-Understands basic 25 - 49% of the time/requires cueing 51 - 75% of the time Expression Expression Mode: Verbal Expression: 2-Expresses basic 25 - 49% of the time/requires cueing 50 - 75% of the time. Uses single words/gestures. Social Interaction Social Interaction: 2-Interacts appropriately 25 - 49% of time - Needs frequent redirection. Problem Solving Problem Solving: 1-Solves basic less than 25% of the time - needs direction nearly all the time or does not effectively solve problems and may need a restraint for safety Memory Memory: 1-Recognizes or recalls less than 25% of the time/requires cueing greater than 75% of the time FIM - Eating Eating Activity: 5: Set-up assist for open containers;5: Supervision/cues  Pain Pain Assessment Pain Assessment: No/denies pain  Therapy/Group: Individual Therapy  Royce Macadamia 03/22/2012, 4:12 PM
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
## 2012-03-23 ENCOUNTER — Inpatient Hospital Stay (HOSPITAL_COMMUNITY): Payer: Medicaid Other
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
## 2012-03-23 NOTE — Progress Notes (Signed)
Occupational Therapy Session Note  Patient Details  Name: Stephen Buckley MRN: [id number] Date of Birth: [birth year]  Today's Date: 03/23/2012 Time: 0800-0850 Time Calculation (min): 50 min  Short Term Goals: Week 2:  OT Short Term Goal 1 (Week 2): Patient will sustain attention for 5 min during a self feeding or grooming task with min cueing  OT Short Term Goal 1 - Progress (Week 2): Progressing toward goal OT Short Term Goal 2 (Week 2): Patient will assist with sliding board transfer to drop arm commode with max assist  OT Short Term Goal 2 - Progress (Week 2): Met OT Short Term Goal 3 (Week 2): Patient will indicate personal need to staff or girlfriend 50% time, e.g. toileting, temperature, hunger  OT Short Term Goal 3 - Progress (Week 2): Met OT Short Term Goal 4 (Week 2): Patient will dress lower body with maximal assistance  OT Short Term Goal 4 - Progress (Week 2): Progressing toward goal  Skilled Therapeutic Interventions/Progress Updates: ADL-retraining with emphasis on sustained attention, upper and lower body bathing and dressing skills, adherence to NWB precaution, and sliding board transfers.   Patient presented with severe confusion and agitation ([place] Level 4) during treatment and was unable to accept attempts at redirection from persistent references to "playing ball" and reminiscence of altercations and conflicts with other players and coaches, often confusing OT with a former friend and/or boss/coach despite minimal verbal cues and simple 1-step prompts.   Patient's wife provided some assist during end of treatment after patient had successfully completed assisted transfer to drop-arm commode placed at bedside.   Patient resisted assisted bathing beyond assist with lower legs.     Therapy Documentation Precautions:  Precautions Precautions: Fall Restrictions Weight Bearing Restrictions: Yes RLE Weight Bearing: Non weight bearing LLE Weight Bearing: Non weight  bearing  Pain: No report of pain      See FIM for current functional status  Therapy/Group: Individual Therapy  Georgeanne Nim 03/23/2012, 12:25 PM

## 2012-03-24 ENCOUNTER — Inpatient Hospital Stay (HOSPITAL_COMMUNITY): Payer: Medicaid Other | Admitting: Physical Therapy

## 2012-03-24 ENCOUNTER — Inpatient Hospital Stay (HOSPITAL_COMMUNITY): Payer: Medicaid Other

## 2012-03-24 ENCOUNTER — Encounter (HOSPITAL_COMMUNITY): Payer: Self-pay | Admitting: Occupational Therapy

## 2012-03-24 ENCOUNTER — Inpatient Hospital Stay (HOSPITAL_COMMUNITY): Payer: Self-pay

## 2012-03-24 ENCOUNTER — Inpatient Hospital Stay (HOSPITAL_COMMUNITY): Payer: Self-pay | Admitting: Occupational Therapy

## 2012-03-24 LAB — GLUCOSE, CAPILLARY

## 2012-03-24 MED ORDER — NICOTINE 21 MG/24HR TD PT24
21.0000 mg | MEDICATED_PATCH | Freq: Every day | TRANSDERMAL | Status: DC
  Administered 2012-03-24 – 2012-03-25 (×2): 21 mg via TRANSDERMAL
  Filled 2012-03-24 (×5): qty 1

## 2012-03-24 MED ORDER — LORAZEPAM 0.5 MG PO TABS
0.5000 mg | ORAL_TABLET | Freq: Every day | ORAL | Status: DC
  Administered 2012-03-25 – 2012-03-27 (×3): 0.5 mg via ORAL
  Filled 2012-03-24 (×4): qty 1

## 2012-03-24 NOTE — Progress Notes (Signed)
Speech Language Pathology Daily Session Note  Patient Details  Name: Stephen Buckley MRN: [id number] Date of Birth: 03-03-1951  Today's Date: 03/24/2012 Time: 4098-1191 Time Calculation (min): 45 min  Short Term Goals: Week 3: SLP Short Term Goal 1 (Week 3): Pt will demonstrate sustained attention to a functional task for 5 minutes with Mod A verbal cues for redirection.  SLP Short Term Goal 1 - Progress (Week 3): Progressing toward goal SLP Short Term Goal 2 (Week 3): Pt will demonstrate efficient mastication of regular textures with Mod verbal cues.  SLP Short Term Goal 2 - Progress (Week 3): Progressing toward goal SLP Short Term Goal 3 (Week 3): Pt will consume Dys. 3 textures and thin liquids with Mod A verbal cues for utilization of swallowing compensatory strategies.  SLP Short Term Goal 3 - Progress (Week 3): Progressing toward goal SLP Short Term Goal 4 (Week 3): Pt will utilize external memory aids to orient to place, time and situation with Mod A verbal and visual cues.  SLP Short Term Goal 4 - Progress (Week 3): Progressing toward goal SLP Short Term Goal 5 (Week 3): Pt will demonstrate functional problem solving with Max A multimodal cueing SLP Short Term Goal 5 - Progress (Week 3): Progressing toward goal SLP Short Term Goal 6 (Week 3): Pt will identify 1 physical deficit with Max A mutlimodal cueing  SLP Short Term Goal 6 - Progress (Week 3): Progressing toward goal  Skilled Therapeutic Interventions: Treatment focused on SLP facilitation of cognitive recovery. Moderate verbal cues required for sustained attention to basic functional ADLs and sorting task, moderate-max verbal cues for increased awareness of errors and self correction during basic problem solving tasks, and moderate  verbal and visual cueing for orientation to time and place required. Patient perseverative on previous tasks, requiring SLP to physically remove task and replace with another in order to redirect.     FIM:  Comprehension Comprehension Mode: Auditory Comprehension: 2-Understands basic 25 - 49% of the time/requires cueing 51 - 75% of the time Expression Expression Mode: Verbal Expression: 3-Expresses basic 50 - 74% of the time/requires cueing 25 - 50% of the time. Needs to repeat parts of sentences. Problem Solving Problem Solving: 2-Solves basic 25 - 49% of the time - needs direction more than half the time to initiate, plan or complete simple activities Memory Memory: 1-Recognizes or recalls less than 25% of the time/requires cueing greater than 75% of the time  Pain Pain Assessment Pain Assessment: No/denies pain Pain Score:   3 Pain Type: Surgical pain Pain Location: Leg Pain Orientation: Left;Other (Comment) Pain Descriptors: Aching Pain Frequency: Intermittent Pain Onset: Gradual Patients Stated Pain Goal: 2 Pain Intervention(s): Medication (See eMAR) (scheduled oxycodone 10 mg cr )  Therapy/Group: Individual Therapy  Ferdinand Lango MA, CCC-SLP [phone]   Ferdinand Lango Meryl 03/24/2012, 12:09 PM

## 2012-03-24 NOTE — Plan of Care (Signed)
Problem: RH SAFETY Goal: RH STG ADHERE TO SAFETY PRECAUTIONS W/ASSISTANCE/DEVICE STG Adhere to Safety Precautions With mod Assistance/Device.  Outcome: Not Progressing Patient uncooperative doesn't want to follow command for safety precautions.. Patient agitated at times.

## 2012-03-24 NOTE — Progress Notes (Signed)
Physical Therapy Session Note  Patient Details  Name: Stephen Buckley MRN: [id number] Date of Birth: 09-05-1950  Today's Date: 03/24/2012 Time: 0932-1026 Time Calculation (min): 54 min  Short Term Goals: Week 2:  PT Short Term Goal 1 (Week 2): Pt will perform bed <> chair transfers with max A PT Short Term Goal 1 - Progress (Week 2): Progressing toward goal PT Short Term Goal 2 (Week 2): Pt will attend to functional task x 60 seconds with min cuing PT Short Term Goal 2 - Progress (Week 2): Met PT Short Term Goal 3 (Week 2): Pt will follow 1 step commands for functional activities with mod cuing PT Short Term Goal 3 - Progress (Week 2): Progressing toward goal  Skilled Therapeutic Interventions/Progress Updates:   Throughout session, pt was confabulating and nonsensical conversation.  Able to talk pt through sliding board transfer, recliner to mat with therapist holding up pt's LEs and using pad to slide pt as pt would lift himself with UEs, overall max@.  Pt followed directions to perform ankle pumps, LAQs, and marching in sitting x 15 reps each, unable to get pt into supine position to perform exercises.  Pt participated in card game x 10 min, playing "21", pt stated after about 7 min that he did not understand what we were trying to do.  Pt needing mod @ to add numbers to determine totals.  Overall from last time this therapist saw pt he is improving with cognition by being easier to direct through a task.  Therapy Documentation Precautions:  Precautions Precautions: Fall Restrictions Weight Bearing Restrictions: Yes RLE Weight Bearing: Non weight bearing LLE Weight Bearing: Non weight bearing Pain: Pain Assessment Pain Assessment: No/denies pain  See FIM for current functional status  Therapy/Group: Individual Therapy  [name] 03/24/2012, 12:35 PM

## 2012-03-24 NOTE — Progress Notes (Signed)
Occupational Therapy Session Note  Patient Details  Name: Stephen Buckley MRN: [id number] Date of Birth: 02/15/1951  Today's Date: 03/24/2012 Time: 1115-1150 Time Calculation (min): 35 min  Short Term Goals: Week 1:  OT Short Term Goal 1 (Week 1): Patient will sustain attention for 5 min during a self feeding or grooming task with min cueing OT Short Term Goal 1 - Progress (Week 1): Progressing toward goal OT Short Term Goal 2 (Week 1): Patient will assist with sliding board transfer to drop arm commode with max assist OT Short Term Goal 2 - Progress (Week 1): Progressing toward goal OT Short Term Goal 3 (Week 1): Patient will indicate personal need to staff or girlfriend 50% time, e.g. toileting, temperature, hunger OT Short Term Goal 3 - Progress (Week 1): Progressing toward goal OT Short Term Goal 4 (Week 1): Patient will dress upper body with minimal assistance OT Short Term Goal 4 - Progress (Week 1): Met OT Short Term Goal 5 (Week 1): Patient will dress lower body with maximal assistance OT Short Term Goal 5 - Progress (Week 1): Progressing toward goal Week 2:  OT Short Term Goal 1 (Week 2): Patient will sustain attention for 5 min during a self feeding or grooming task with min cueing  OT Short Term Goal 1 - Progress (Week 2): Progressing toward goal OT Short Term Goal 2 (Week 2): Patient will assist with sliding board transfer to drop arm commode with max assist  OT Short Term Goal 2 - Progress (Week 2): Met OT Short Term Goal 3 (Week 2): Patient will indicate personal need to staff or girlfriend 50% time, e.g. toileting, temperature, hunger  OT Short Term Goal 3 - Progress (Week 2): Met OT Short Term Goal 4 (Week 2): Patient will dress lower body with maximal assistance  OT Short Term Goal 4 - Progress (Week 2): Progressing toward goal Week 3:  OT Short Term Goal 1 (Week 3): Patient will sustain attention for 5 min during a self feeding or grooming task with min cueing  OT  Short Term Goal 2 (Week 3): Patient will dress lower body with maximal assistance  OT Short Term Goal 3 (Week 3): Pt will consistently (>50%) perform sliding board transfers with min A  Skilled Therapeutic Interventions/Progress Updates:    1:1 Pt's girlfriend reported he already bathed and changed clothes today. Engaged in cognitive activity to address attention to task, following one step directions, orientation, awareness, simple problem solving. Had list of 3 questions on paper name, date and place. Pt able to complete name, date looking at newspaper (provided) without cuing, and place with 3 choices (unable to chose hospital). Pt able to complete this task with extra time in a calm manner.  Provided another paper with three things to find in newspaper. Pt then became agitated and difficulty to redirect due to increased movement in the room by other people. Total A to keep pt from standing up and total A +2 to transition into bed, RN called in.  Therapy Documentation Precautions:  Precautions Precautions: Fall Restrictions Weight Bearing Restrictions: Yes RLE Weight Bearing: Non weight bearing LLE Weight Bearing: Non weight bearing Pain: Pain Assessment Pain Assessment: No/denies pain  See FIM for current functional status  Therapy/Group: Individual Therapy  [name] [facility] 03/24/2012, 1:18 PM

## 2012-03-24 NOTE — Progress Notes (Signed)
Patient continues to require waistbelt in bed due to decrease safety awareness . Patient impulsive and not following safety protocol . Patient becomes loud and curses at staff when he is attempting out of bed .   Continue with plan of care .                                                                  Cleotilde Neer

## 2012-03-24 NOTE — Progress Notes (Signed)
Occupational Therapy Session Note  Patient Details  Name: Stephen Buckley MRN: [id number] Date of Birth: 05-24-1951  Today's Date: 03/24/2012 Time: 1330-1400 Time Calculation (min): 30 min  Short Term Goals: Week 3:  OT Short Term Goal 1 (Week 3): Patient will sustain attention for 5 min during a self feeding or grooming task with min cueing  OT Short Term Goal 2 (Week 3): Patient will dress lower body with maximal assistance  OT Short Term Goal 3 (Week 3): Pt will consistently (>50%) perform sliding board transfers with min A  Skilled Therapeutic Interventions/Progress Updates:    1:1 when entered the room pt sitting EOB with no pants on and wet t-shirt crying. Pt with increased lability with difficulty consoling self. Pt with increased self awareness, repeating over and over he cant stand on his legs but with little awareness why. Read paper he wrote previously oriented him to place, situation and precautions. Unsure how much pt internalized. Total A to thread pants and pullover hips to transfer via slide board to [facility]. Pt voided urine and then required total A to pull pants up but pt did recall he was not able to stand and initiated leaning side to side. Pt also changed shirt with supervision once instructed. Pt transferred [facility] to recliner with steady A via slide board but then was instructed to transfer into bed for xray to come and transport him requiring total A +2 to transition back to bed.    Therapy Documentation Precautions:  Precautions Precautions: Fall Restrictions Weight Bearing Restrictions: Yes RLE Weight Bearing: Non weight bearing LLE Weight Bearing: Non weight bearing Pain: Pain Assessment Pain Assessment: 0-10 Faces Pain Scale: Hurts whole lot Pain Location: Leg Pain Orientation: Right;Left Pain Descriptors: Aching Pain Frequency: Occasional Pain Onset: Gradual Patients Stated Pain Goal: 2 Pain Intervention(s): Medication (See eMAR) (vicodin 2 po)  See FIM  for current functional status  Therapy/Group: Individual Therapy  [name] [facility] 03/24/2012, 2:48 PM
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
## 2012-03-25 ENCOUNTER — Inpatient Hospital Stay (HOSPITAL_COMMUNITY): Payer: Self-pay | Admitting: Physical Therapy

## 2012-03-25 ENCOUNTER — Inpatient Hospital Stay (HOSPITAL_COMMUNITY): Payer: Medicaid Other | Admitting: Physical Therapy

## 2012-03-25 ENCOUNTER — Inpatient Hospital Stay (HOSPITAL_COMMUNITY): Payer: Self-pay | Admitting: Speech Pathology

## 2012-03-25 ENCOUNTER — Inpatient Hospital Stay (HOSPITAL_COMMUNITY): Payer: Medicaid Other | Admitting: Occupational Therapy

## 2012-03-25 DIAGNOSIS — S72309A Unspecified fracture of shaft of unspecified femur, initial encounter for closed fracture: Secondary | ICD-10-CM

## 2012-03-25 DIAGNOSIS — Z5189 Encounter for other specified aftercare: Secondary | ICD-10-CM

## 2012-03-25 DIAGNOSIS — IMO0002 Reserved for concepts with insufficient information to code with codable children: Secondary | ICD-10-CM

## 2012-03-25 DIAGNOSIS — S82109A Unspecified fracture of upper end of unspecified tibia, initial encounter for closed fracture: Secondary | ICD-10-CM

## 2012-03-25 DIAGNOSIS — S069X9A Unspecified intracranial injury with loss of consciousness of unspecified duration, initial encounter: Secondary | ICD-10-CM

## 2012-03-25 LAB — URINALYSIS, ROUTINE W REFLEX MICROSCOPIC
Bilirubin Urine: NEGATIVE
Leukocytes, UA: NEGATIVE
Nitrite: NEGATIVE
Specific Gravity, Urine: 1.012 (ref 1.005–1.030)
pH: 7.5 (ref 5.0–8.0)
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
## 2012-03-25 NOTE — Progress Notes (Signed)
Physical Therapy Session Note  Patient Details  Name: Stephen Buckley MRN: [id number] Date of Birth: [birth year]  Today's Date: 03/25/2012 Time: 0807-0855 Time Calculation (min): 48 min  Short Term Goals: Week 2:  PT Short Term Goal 1 (Week 2): Pt will perform bed <> chair transfers with max A PT Short Term Goal 1 - Progress (Week 2): Progressing toward goal PT Short Term Goal 2 (Week 2): Pt will attend to functional task x 60 seconds with min cuing PT Short Term Goal 2 - Progress (Week 2): Met PT Short Term Goal 3 (Week 2): Pt will follow 1 step commands for functional activities with mod cuing PT Short Term Goal 3 - Progress (Week 2): Progressing toward goal  Skilled Therapeutic Interventions/Progress Updates:    This morning's session focused on bed mobility, transfers (although not safely and not NWB bil legs due to pt's refusal and agitation), and attention to task/orientation to time.  Pt attended longest to cleaning task (self selected task) for ~8 mins.  Very agitated this morning taking 2-3 staff members to get him to the [facility], changed out of wet diaper and clothed with safety belt in WC.    Therapy Documentation Precautions:  Precautions Precautions: Fall Restrictions Weight Bearing Restrictions: Yes RLE Weight Bearing: Non weight bearing LLE Weight Bearing: Non weight bearing Vital Signs: Therapy Vitals Temp: 98.7 F (37.1 C) Temp src: Oral Pulse Rate: 91  Resp: 19  BP: 113/74 mmHg Patient Position, if appropriate: Sitting Oxygen Therapy SpO2: 94 % O2 Device: None (Room air) Pain: Pain Assessment Pain Assessment: No/denies pain Pain Score: 0-No pain Mobility: Bed Mobility Supine to Sit: 6: Modified independent (Device/Increase time);With rails;HOB flat Sitting - Scoot to Edge of Bed: 6: Modified independent (Device/Increase time);With Press photographer Transfers: 1: +2 Total assist;With armrests Stand Pivot Transfer Details: Verbal cues for  precautions/safety;Manual facilitation for placement;Manual facilitation for weight bearing Stand Pivot Transfer Details (indicate cue type and reason): pt is not suppose to stand pivot transfer due to NWB bil legs, but he would not attend to reason and was trying to get out of the bed.  RN tech assisting to help calm pt and get him safely into the chair.   Locomotion : Wheelchair Mobility Distance: 15     Balance: Cabin crew Sitting - Balance Support: Right upper extremity supported;Left upper extremity supported;Feet supported Static Sitting - Level of Assistance: 5: Stand by assistance Static Sitting - Comment/# of Minutes: when calm pt could maintain his sitting balance well.  However, safety makes him max assist to keep from falling into the floor due to wanting to stand.     Other Treatments: Treatments Therapeutic Activity: This morning's session focused on bed mobility, transfers, although not NWB due to pt's aggitation and orientation to day/month/year.  We also tried to attend to tasks around his room in a minimally distracting environment (his door was open).  He preformed cleaning and straigtening tasks and attended to this for about 8 mins before distracted by people walking in the hallway.  Unfortunately the pt would not attend to therapist selected tasks (i.e suggestion of card playing, going into the hallway in the [facility], brushing his hair at the sink).    See FIM for current functional status  Therapy/Group: Individual Therapy  Lurena Joiner B. Hiedi Touchton, PT, DPT [phone]   03/25/2012, 9:04 AM

## 2012-03-25 NOTE — Progress Notes (Signed)
Patient took vicodin 2 and ativan 0.5mg  po at 1830  For girlfriend crushed in chocolate ensure pudding . Patient has continued to be difficult to redirect and agitates easily . Patient continent x 1 urine sample sent to lab .  Cleotilde Neer

## 2012-03-25 NOTE — Progress Notes (Signed)
Physical Therapy Note  Patient Details  Name: Stephen Buckley MRN: [id number] Date of Birth: 04-24-1951 Today's Date: 03/25/2012  Time: 1330-1340 and 1355-1420 10 and 25 minutes  1:1  Pt initially found sleeping in room, girlfriend present.  Girlfriend requested PT not to wake pt as he had not slept all night.  Girlfriend educated on need and importance of normalized sleep schedule.  Also discussed ideas if pt is tired/agitated when needing to go to appointments, etc.  Girlfriend with good understanding of need to reschedule appointments if necessary and understands importance of normalizing sleep.  PT agreed to come back in 25 minutes to check on pt and attempt car transfer with girlfriend.  Upon PT return pt still sleeping.  Awoken by PT and girlfriend.  Pt initially sat up but then became agitated, refusing to transfer into w/c.  Girlfriend and PT attempted multiple times to assist pt into w/c, pt becoming more agitated, throwing urinal at PT.  Then attempted to have pt lay back down in bed, pt refused this.  Pt sat edge of bed quietly x 2 minutes, then requested to use urinal.  Pt continent of bladder, then able to get into bed safely without assistance.  Pt left laying comfortably in bed with waist belt on, 4 siderails and bed alarm set.    DONAWERTH,KAREN 03/25/2012, 2:33 PM

## 2012-03-25 NOTE — Plan of Care (Signed)
Problem: RH Dressing Goal: LTG Patient will perform lower body dressing w/assist (OT) LTG: Patient will perform lower body dressing with assist, with/without cues in positioning using equipment (OT)  50% of the time   Problem: RH Toilet Transfers Goal: LTG Patient will perform toilet transfers w/assist (OT) LTG: Patient will perform toilet transfers with assist, with/without cues using equipment (OT)  50% of the time

## 2012-03-25 NOTE — Progress Notes (Signed)
Pt agitated most of shift, Pt did not sleep during the night, Pt fell asleep at 0600
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
## 2012-03-25 NOTE — Progress Notes (Signed)
Patient incontinent and combative with staff during hygiene needs . Patient yelling and cursing at staff . Patient refusing to take po ativan and vicodin . Girlfriend "[name]" attempted to give meds to patient and he refused . [name] reported she needs a break and left room while staff required to staff with patient due to waistlbelt off due to being soiled . Reviewed with [name] required needs at home for patient when he escalates and how to attempt to deescalate patient . Patient in bed with head under covers refusing to cooperate with staff. Side rails up x 4 bed alarm activated and patient continues to threaten staff and yell. Patient taking few moments to rest and stay quiet . Continue to monitor.           Cleotilde Neer

## 2012-03-25 NOTE — Progress Notes (Signed)
Occupational Therapy Session Note  Patient Details  Name: Stephen Buckley MRN: [id number] Date of Birth: 02-05-1951  Today's Date: 03/25/2012 Time: 1115-1200 Time Calculation (min): 45 min  Short Term Goals: Week 3:  OT Short Term Goal 1 (Week 3): Patient will sustain attention for 5 min during a self feeding or grooming task with min cueing  OT Short Term Goal 2 (Week 3): Patient will dress lower body with maximal assistance  OT Short Term Goal 3 (Week 3): Pt will consistently (>50%) perform sliding board transfers with min A  Skilled Therapeutic Interventions/Progress Updates:    1:1 family education with Stephen Buckley (pt's girlfriend on home environment, having support or a 2nd person at times when behavior is challenging, use of restraints at home for safety, using slide board with bed pad as a distractor (from the board), being patient and open minded. Sheri instructed to help Pt don pants and transfer from recliner to w/c via slide board. However pt's behavior became more challenging resisting assistance or cooperating in functional tasks. Transferred with +2 into bed.    Therapy Documentation Precautions:  Precautions Precautions: Fall Restrictions Weight Bearing Restrictions: Yes RLE Weight Bearing: Non weight bearing LLE Weight Bearing: Non weight bearing Pain: Pain Assessment Pain Assessment: No/denies pain Pain Score:   5 Pain Type: Surgical pain Pain Location: Leg Pain Orientation: Right;Left Pain Descriptors: Aching Pain Frequency: Occasional Pain Onset: Gradual Patients Stated Pain Goal: 2 Pain Intervention(s): Medication (See eMAR) (vicodin 2 po)  See FIM for current functional status  Therapy/Group: Individual Therapy  [name] [facility] 03/25/2012, 1:14 PM

## 2012-03-25 NOTE — Progress Notes (Signed)
Speech Language Pathology Daily Session Note  Patient Details  Name: Stephen Buckley MRN: [id number] Date of Birth: Aug 27, 1950  Today's Date: 03/25/2012 Time: 4010-2725 Time Calculation (min): 45 min  Short Term Goals: Week 3: SLP Short Term Goal 1 (Week 3): Pt will demonstrate sustained attention to a functional task for 5 minutes with Mod A verbal cues for redirection.  SLP Short Term Goal 1 - Progress (Week 3): Progressing toward goal SLP Short Term Goal 2 (Week 3): Pt will demonstrate efficient mastication of regular textures with Mod verbal cues.  SLP Short Term Goal 2 - Progress (Week 3): Progressing toward goal SLP Short Term Goal 3 (Week 3): Pt will consume Dys. 3 textures and thin liquids with Mod A verbal cues for utilization of swallowing compensatory strategies.  SLP Short Term Goal 3 - Progress (Week 3): Progressing toward goal SLP Short Term Goal 4 (Week 3): Pt will utilize external memory aids to orient to place, time and situation with Mod A verbal and visual cues.  SLP Short Term Goal 4 - Progress (Week 3): Progressing toward goal SLP Short Term Goal 5 (Week 3): Pt will demonstrate functional problem solving with Max A multimodal cueing SLP Short Term Goal 5 - Progress (Week 3): Progressing toward goal SLP Short Term Goal 6 (Week 3): Pt will identify 1 physical deficit with Max A mutlimodal cueing  SLP Short Term Goal 6 - Progress (Week 3): Progressing toward goal  Skilled Therapeutic Interventions: Treatment focused on SLP facilitation of cognitive recovery s/p TBI. Patient agitated this am however calmed easily when allowed to complete tasks independently and on own time, including sorting and cleaning task of patient's choice. Max tactile verbal, and visual cues required to sustain attention to task. Patient became obviously fatigued by end of session and more agreeable to clincian's redirection of tasks. Max assist for basic problem solving noted.    FIM:    Comprehension Comprehension Mode: Auditory Comprehension: 2-Understands basic 25 - 49% of the time/requires cueing 51 - 75% of the time Expression Expression Mode: Verbal Expression: 3-Expresses basic 50 - 74% of the time/requires cueing 25 - 50% of the time. Needs to repeat parts of sentences. Social Interaction Social Interaction: 2-Interacts appropriately 25 - 49% of time - Needs frequent redirection. Problem Solving Problem Solving: 2-Solves basic 25 - 49% of the time - needs direction more than half the time to initiate, plan or complete simple activities Memory Memory: 1-Recognizes or recalls less than 25% of the time/requires cueing greater than 75% of the time  Pain Pain Assessment Pain Assessment: No/denies pain Pain Score:   5 Pain Type: Surgical pain Pain Location: Leg Pain Orientation: Right;Left Pain Descriptors: Aching Pain Frequency: Occasional Pain Onset: Gradual Patients Stated Pain Goal: 2 Pain Intervention(s): Medication (See eMAR) (vicodin 2 po)  Therapy/Group: Individual Therapy  Ferdinand Lango MA, CCC-SLP [phone]   Ferdinand Lango Meryl 03/25/2012, 12:35 PM

## 2012-03-26 ENCOUNTER — Encounter (HOSPITAL_COMMUNITY): Payer: Self-pay | Admitting: Occupational Therapy

## 2012-03-26 ENCOUNTER — Inpatient Hospital Stay (HOSPITAL_COMMUNITY): Payer: Self-pay | Admitting: Speech Pathology

## 2012-03-26 ENCOUNTER — Inpatient Hospital Stay (HOSPITAL_COMMUNITY): Payer: Medicaid Other | Admitting: Physical Therapy

## 2012-03-26 MED ORDER — LORAZEPAM 0.5 MG PO TABS: 0.5000 mg | ORAL_TABLET | Freq: Every day | ORAL | Status: DC | PRN

## 2012-03-26 MED ORDER — HYDROCODONE-ACETAMINOPHEN 5-325 MG PO TABS: 1.0000 | ORAL_TABLET | ORAL | Status: DC | PRN

## 2012-03-26 MED ORDER — TRAZODONE HCL 100 MG PO TABS: 200.0000 mg | ORAL_TABLET | Freq: Every day | ORAL | Status: DC

## 2012-03-26 MED ORDER — CARBAMAZEPINE 200 MG PO TABS: 200.0000 mg | ORAL_TABLET | Freq: Three times a day (TID) | ORAL | Status: DC

## 2012-03-26 NOTE — Progress Notes (Signed)
Speech Language Pathology Daily Session Note  Patient Details  Name: Stephen Buckley MRN: [id number] Date of Birth: 02/07/1951  Today's Date: 03/26/2012 Time: 4098-1191 Time Calculation (min): 45 min  Short Term Goals: Week 3: SLP Short Term Goal 1 (Week 3): Pt will demonstrate sustained attention to a functional task for 5 minutes with Mod A verbal cues for redirection.  SLP Short Term Goal 1 - Progress (Week 3): Progressing toward goal SLP Short Term Goal 2 (Week 3): Pt will demonstrate efficient mastication of regular textures with Mod verbal cues.  SLP Short Term Goal 2 - Progress (Week 3): Progressing toward goal SLP Short Term Goal 3 (Week 3): Pt will consume Dys. 3 textures and thin liquids with Mod A verbal cues for utilization of swallowing compensatory strategies.  SLP Short Term Goal 3 - Progress (Week 3): Met SLP Short Term Goal 4 (Week 3): Pt will utilize external memory aids to orient to place, time and situation with Mod A verbal and visual cues.  SLP Short Term Goal 4 - Progress (Week 3): Progressing toward goal SLP Short Term Goal 5 (Week 3): Pt will demonstrate functional problem solving with Max A multimodal cueing SLP Short Term Goal 5 - Progress (Week 3): Progressing toward goal SLP Short Term Goal 6 (Week 3): Pt will identify 1 physical deficit with Max A mutlimodal cueing  SLP Short Term Goal 6 - Progress (Week 3): Progressing toward goal  Skilled Therapeutic Interventions: Treatment focused on SLP facilitation of cognitive recovery. SLP provided max initial verbal cues to direct attention to basic functional ADL, patient then able to sustain attention to self feeding task and meal prep with mod verbal cues to redirect. Patient disoriented initially stating that he was "at home" however able to carryover and verbalize "hospital" after a 10 minute delay. Patient crying intermittently during treatment today, worsened when re-explained reason for hospitalization  demonstrating improving intellectual awareness of situation and deficits. Educated girlfriend on benefits of reinforcing/re-educating patient on current situation in improving awareness and overall cognition. No overt difficulty noted with regular texture liquids and solids with supervision for safety.     FIM:  Comprehension Comprehension Mode: Auditory Comprehension: 3-Understands basic 50 - 74% of the time/requires cueing 25 - 50%  of the time Expression Expression Mode: Verbal Expression: 4-Expresses basic 75 - 89% of the time/requires cueing 10 - 24% of the time. Needs helper to occlude trach/needs to repeat words. Social Interaction Social Interaction: 2-Interacts appropriately 25 - 49% of time - Needs frequent redirection. Problem Solving Problem Solving: 2-Solves basic 25 - 49% of the time - needs direction more than half the time to initiate, plan or complete simple activities Memory Memory: 1-Recognizes or recalls less than 25% of the time/requires cueing greater than 75% of the time  Pain Pain Assessment Pain Assessment: No/denies pain  Therapy/Group: Individual Therapy  Ferdinand Lango MA, CCC-SLP [phone]   Ferdinand Lango Meryl 03/26/2012, 10:38 AM

## 2012-03-26 NOTE — Progress Notes (Signed)
Social Work Patient ID: Stephen Buckley, male   DOB: 04-05-1951, 61 y.o.   MRN: [id number]  Have reviewed team conference with pt's girlfriend, [name], who is agreeable with change in d/c date to tomorrow with focus on completing family education today.  She is concerned about how she will get patient in the home - will discuss with PT and decide if ambulance transfer home may be necessary.  ([facility] application has been submitted, however, not active with that program yet).  Will also arrange HH tx f/u and DME.  Therapists have been reviewing behavioral strategies for gf to use here and at home.  Will continue to follow.  Antwan Pandya

## 2012-03-26 NOTE — Discharge Summary (Signed)
  Discharge summary job # [id number]

## 2012-03-26 NOTE — Progress Notes (Signed)
Occupational Therapy Session Note  Patient Details  Name: Stephen Buckley MRN: [id number] Date of Birth: Aug 29, 1950  Today's Date: 03/26/2012 Time: 1005-1045 Time Calculation (min): 40 min  Short Term Goals: Week 3:  OT Short Term Goal 1 (Week 3): Patient will sustain attention for 5 min during a self feeding or grooming task with min cueing  OT Short Term Goal 2 (Week 3): Patient will dress lower body with maximal assistance  OT Short Term Goal 3 (Week 3): Pt will consistently (>50%) perform sliding board transfers with min A  Skilled Therapeutic Interventions/Progress Updates:    Pt sleeping when therapist entered room.Therapist woke pt up and introduced himself and the goal for the session.  Pt resistant to participating in bathing task and even with max attempts to redirect he would not attempt.  Did finally agree to attempt donning shirt and shorts.  Throughout session pt would become easily agitated when trying to explain to him about his therapy and the need to participate.  Lacks intellectual awareness of deficits as well.   Attempted to stand to pull pants over his hips and needed redirection to his NWBing status multiple times.  Eventually persuaded him to lay down and roll in the bed for therapist to assist with pulling pants over hips.  Pt would not agree to get out of bed after finishing dressing.  Retraint belt reapplied to waist and bed alarm turned on.  Therapy Documentation Precautions:  Precautions Precautions: Fall Restrictions Weight Bearing Restrictions: Yes RLE Weight Bearing: Non weight bearing LLE Weight Bearing: Non weight bearing  Pain:  No report of pain ADL: See FIM for current functional status  Therapy/Group: Individual Therapy  Neeka Urista OTR/L 03/26/2012, 2:49 PM
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
## 2012-03-26 NOTE — Progress Notes (Signed)
Nutrition Follow-up  Intervention: Continue current interventions.  Assessment:   Per conference note, pt with continued behavioral issues and girlfriend feels as though these will decrease when he returns home.   Intake appears to wax and wane with mental status. Confirmed this with RN. Discussed nutrition needs with girlfriend, she denied any questions or concerns at this time. She states that she went to the grocery store and has stocked up on things like yogurt, Boost, etc in preparation for pt's arrival. Provided her with coupons for Ensure when d/c.   Medications and labs reviewed.   Diet Order:  Regular Supplement: Ensure Pudding PO QID  Meds: Scheduled Meds:    . carbamazepine  200 mg Oral TID  . feeding supplement  1 Container Oral QID  . LORazepam  0.5 mg Oral Daily  . nicotine  21 mg Transdermal Daily  . traZODone  200 mg Oral QHS  . DISCONTD: oxyCODONE  10 mg Oral Q12H   Continuous Infusions:  PRN Meds:.acetaminophen, HYDROcodone-acetaminophen, LORazepam, LORazepam, ondansetron (ZOFRAN) IV, ondansetron, polyethylene glycol, RESOURCE THICKENUP CLEAR, sorbitol, traZODone  Labs:  CMP     Component Value Date/Time   NA 142 03/21/2012 0952   K 3.6 03/21/2012 0952   CL 105 03/21/2012 0952   CO2 27 03/21/2012 0952   GLUCOSE 99 03/21/2012 0952   BUN 10 03/21/2012 0952   CREATININE 0.79 03/21/2012 0952   CALCIUM 10.1 03/21/2012 0952   PROT 7.6 03/21/2012 0952   ALBUMIN 3.4* 03/21/2012 0952   AST 40* 03/21/2012 0952   ALT 33 03/21/2012 0952   ALKPHOS 223* 03/21/2012 0952   BILITOT 0.6 03/21/2012 0952   GFRNONAA >90 03/21/2012 0952   GFRAA >90 03/21/2012 0952     Intake/Output Summary (Last 24 hours) at 03/26/12 1124 Last data filed at 03/26/12 0800  Gross per 24 hour  Intake    240 ml  Output      0 ml  Net    240 ml  BM 9/23  Weight Status:  116 lb - wt now stabilizing  Estimated needs:  1875 - 2075 kcal, 88 - 100 grams protein  Nutrition Dx: Inadequate oral intake r/t  variable attention AEB ongoing weight loss and variable intake. Improving.  Goal: Pt to meet >/= 90% of their estimated nutrition needs; improving  Monitor: weight trends, lab trends, I/O's, PO intake, supplement tolerance  Jarold Motto MS, RD, LDN Pager: [phone] After-hours pager: [phone]
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
## 2012-03-26 NOTE — Progress Notes (Signed)
Physical Therapy Discharge Summary  Patient Details  Name: Stephen Buckley MRN: [id number] Date of Birth: 1950-08-28  Today's Date: 03/26/2012  Patient has met 6 of 6 long term goals due to improved activity tolerance, improved postural control, increased strength, decreased pain and improved attention.  Patient to discharge at a wheelchair level Supervision for w/c mobility, max A for sliding board transfers.  Pt continues to be limited by impaired awareness of deficits and will require 24/7 supervision at home to maintain NWB.  Pt's cognitive deficits limited progress with physical mobility.   Patient's care partner is independent to provide the necessary physical and cognitive assistance at discharge.  Pt's caregiver has been educated on how to deal with pt's decreased awareness, agitation and combativeness and feels she is able to care for pt at home.  Reasons goals not met: n/a  Recommendation:  Patient will benefit from ongoing skilled PT services in home health setting to continue to advance safe functional mobility, address ongoing impairments in attention, awareness, mobility, and minimize fall risk.  Equipment: w/c, RW, hospital bed  Reasons for discharge: treatment goals met and discharge from hospital  Patient/family agrees with progress made and goals achieved: Yes  PT Discharge Precautions/Restrictions Precautions Precautions: Fall Restrictions RLE Weight Bearing: Non weight bearing LLE Weight Bearing: Non weight bearing  Cognition Overall Cognitive Status: Impaired Arousal/Alertness: Awake/alert Focused Attention: Appears intact Sustained Attention: Impaired Sustained Attention Impairment: Verbal basic;Functional basic Memory: Impaired Memory Impairment: Decreased recall of new information Awareness: Impaired Awareness Impairment: Intellectual impairment Behaviors: Impulsive;Restless;Verbal agitation;Physical agitation;Poor frustration  tolerance;Confabulation;Lability Rancho Mirant Scales of Cognitive Functioning: Confused/inappropriate/non-agitated Sensation Sensation Light Touch: Appears Intact Proprioception: Appears Intact Coordination Gross Motor Movements are Fluid and Coordinated: Yes Fine Motor Movements are Fluid and Coordinated: Yes Motor  Motor Motor: Within Functional Limits   Trunk/Postural Assessment  Cervical Assessment Cervical Assessment: Within Functional Limits Thoracic Assessment Thoracic Assessment: Within Functional Limits Lumbar Assessment Lumbar Assessment: Within Functional Limits Postural Control Postural Control: Within Functional Limits  Balance Static Sitting Balance Static Sitting - Level of Assistance: 5: Stand by assistance Extremity Assessment      RLE Assessment RLE Assessment: Within Functional Limits LLE Assessment LLE Assessment: Within Functional Limits  See FIM for current functional status  [name] 03/26/2012, 3:38 PM
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
## 2012-03-26 NOTE — Progress Notes (Signed)
[facility]  Patient Status: New  [facility] is providing the following services: PT, OT, ST and MSW  If patient discharges after hours, please call [phone].   Letha Cape 03/26/2012, 4:21 PM

## 2012-03-26 NOTE — Progress Notes (Signed)
Physical Therapy Note  Patient Details  Name: Joshual Terrio MRN: [id number] Date of Birth: September 07, 1950 Today's Date: 03/26/2012  1445-1525 (40 minutes) individual Pain: no complaint of pain Focus of treatment: therapeutic activities focused on recall of information and completing task Treatment: Pt to locate specific rooms on unit / required max vcs for recall of correct numbers and mod vcs for direction finding; pt to duplicate object from picture using pipe tree . Pt required mod vcs for correct size fittings and complete task.   Rumi Kolodziej,JIM 03/26/2012, 8:46 AM

## 2012-03-26 NOTE — Plan of Care (Signed)
Problem: RH BLADDER ELIMINATION Goal: RH STG MANAGE BLADDER WITH ASSISTANCE STG Manage Bladder With Mod Assistance  Outcome: Not Progressing Patient incontinent with urgency.   Problem: RH SAFETY Goal: RH STG ADHERE TO SAFETY PRECAUTIONS W/ASSISTANCE/DEVICE STG Adhere to Safety Precautions With mod Assistance/Device.  Outcome: Not Progressing Patient with poor safety awareness. Patient is impulsive.

## 2012-03-27 ENCOUNTER — Inpatient Hospital Stay (HOSPITAL_COMMUNITY): Payer: Self-pay | Admitting: Physical Therapy

## 2012-03-27 ENCOUNTER — Inpatient Hospital Stay (HOSPITAL_COMMUNITY): Payer: Self-pay | Admitting: Speech Pathology

## 2012-03-27 ENCOUNTER — Encounter (HOSPITAL_COMMUNITY): Payer: Self-pay | Admitting: Occupational Therapy

## 2012-03-27 NOTE — Progress Notes (Signed)
Speech Language Pathology Discharge Summary  Patient Details  Name: Stephen Buckley MRN: [id number] Date of Birth: [birth year]  Today's Date: 03/27/2012  Patient has met 3 of 7 long term goals.  Patient to discharge at overall Max level.  Reasons goals not met: Continues to require mod-max cueing for problem solving, orientation, memory, and attention which fluctuates based on overall behavior   Clinical Impression/Discharge Summary: Patient has made functional gains during inpatient rehab admission, meeting 3/7 goals. Diet advanced to regular solids, thin liquids in which patient is able to consume with min cues for safe swallowing strategies. He continues however to demonstrate with fluctuations in behavior, presenting with behaviors consistent with a Rancho Level  V (confused-inappropriate). He requires mod-max assist for problem solving, orientation, memory, and attention however is noted to be beginning  to present with some emerging intellectual awareness of situation. Plan is to d/c home with girlfriend who has been educated on behavioral management  Care Partner:  Caregiver Able to Provide Assistance: Yes  Type of Caregiver Assistance: Physical;Cognitive  Recommendation:  Home Health SLP;24 hour supervision/assistance  Rationale for SLP Follow Up: Maximize cognitive function and independence;Reduce caregiver burden    Reasons for discharge: Discharged from hospital   Patient/Family Agrees with Progress Made and Goals Achieved: Yes   See FIM for current functional status  Ferdinand Lango MA, CCC-SLP [phone]  Ferdinand Lango Meryl 03/27/2012, 9:31 AM
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
## 2012-03-27 NOTE — Progress Notes (Signed)
Occupational Therapy Discharge Summary  Patient Details  Name: Stephen Buckley MRN: [id number] Date of Birth: 1950-10-28  Today's Date: 03/27/2012    Patient has met 8 of 9 long term goals due to improved activity tolerance, improved balance, postural control, ability to compensate for deficits, functional use of  RIGHT upper and LEFT upper extremity and improved attention.  Patient to discharge at overall supervision to total A with basic ADLs level, depending on cognition. Pt is able to perform basic slide board transfers with as little as min A with caregiver, Lavonna Rua (pt's girlfriend). Pt's current behavior is consistent with Rancho Level V.  Patient's care partner is independent to provide the necessary physical and cognitive assistance at discharge.    Reasons goals not met: n/a  Recommendation:  Patient will benefit from ongoing skilled OT services in home health setting to continue to advance functional skills in the area of BADL and Reduce care partner burden.  Equipment: hosptial bed, drop arm commode, w/c and cushion  Reasons for discharge: treatment goals met and discharge from hospital  Patient/family agrees with progress made and goals achieved: Yes  OT Discharge Precautions/Restrictions  Precautions Precautions: Fall Restrictions Weight Bearing Restrictions: Yes RLE Weight Bearing: Non weight bearing LLE Weight Bearing: Non weight bearing Pain  off and on HA and leg pain ADL ADL Grooming: Supervision/safety;Maximal cueing Where Assessed-Grooming: Sitting at sink;Wheelchair Upper Body Bathing: Supervision/safety;Maximal cueing Where Assessed-Upper Body Bathing: Sitting at sink;Wheelchair Lower Body Bathing: Dependent Where Assessed-Lower Body Bathing: Bed level Upper Body Dressing: Supervision/safety;Moderate cueing Where Assessed-Upper Body Dressing: Edge of bed Lower Body Dressing: Dependent Where Assessed-Lower Body Dressing: Bed level Toileting: Maximal  cueing;Maximal assistance Where Assessed-Toileting: Teacher, adult education: Maximal Dentist Method: Scientist, research (life sciences): Drop arm bedside commode Vision/Perception  Vision - History Baseline Vision: No visual deficits  Cognition Overall Cognitive Status: Impaired Arousal/Alertness: Awake/alert Orientation Level: Oriented to person Focused Attention: Appears intact Sustained Attention: Impaired Sustained Attention Impairment: Verbal basic;Functional basic Memory: Impaired Memory Impairment: Decreased recall of new information Decreased Long Term Memory: Verbal basic;Functional basic Decreased Short Term Memory: Verbal basic;Functional basic Awareness: Impaired Awareness Impairment: Intellectual impairment Problem Solving: Impaired Problem Solving Impairment: Verbal basic;Functional basic Executive Function: Reasoning;Sequencing;Organizing;Decision Making;Initiating Reasoning: Impaired Reasoning Impairment: Verbal basic;Functional basic Sequencing: Impaired Sequencing Impairment: Verbal basic;Functional basic Organizing: Impaired Organizing Impairment: Verbal basic;Functional basic Decision Making: Impaired Decision Making Impairment: Verbal basic;Functional basic Initiating: Impaired Initiating Impairment: Verbal basic;Functional basic Self Correcting: Impaired Self Correcting Impairment: Verbal basic;Functional basic Behaviors: Impulsive;Physical agitation;Poor frustration tolerance;Confabulation;Lability;Verbal agitation;Restless Safety/Judgment: Impaired Rancho Mirant Scales of Cognitive Functioning: Confused/inappropriate/non-agitated Sensation Sensation Light Touch: Appears Intact Stereognosis: Appears Intact Proprioception: Appears Intact Coordination Gross Motor Movements are Fluid and Coordinated: Yes Fine Motor Movements are Fluid and Coordinated: Yes Motor  Motor Motor: Within Functional Limits Mobility  Bed  Mobility Supine to Sit: 6: Modified independent (Device/Increase time) Sitting - Scoot to Edge of Bed: 6: Modified independent (Device/Increase time)  Trunk/Postural Assessment  Cervical Assessment Cervical Assessment: Within Functional Limits Thoracic Assessment Thoracic Assessment: Within Functional Limits Lumbar Assessment Lumbar Assessment: Within Functional Limits Postural Control Postural Control: Within Functional Limits  Balance Static Sitting Balance Static Sitting - Level of Assistance: 7: Independent Extremity/Trunk Assessment RUE Assessment RUE Assessment: Within Functional Limits LUE Assessment LUE Assessment: Within Functional Limits  See FIM for current functional status  [name] [facility] 03/27/2012, 3:51 PM

## 2012-03-27 NOTE — Progress Notes (Signed)
Social Work Discharge Note  The overall goal for the admission was met for:   Discharge location: Yes - home with girlfriend, Duanne Moron of Stay: Yes   Discharge activity level: No - behavioral issues prevented from meeting initial goals  Home/community participation: No  Services provided included: MD, RD, PT, OT, SLP, RN, TR, Pharmacy, Neuropsych and SW  Financial Services: Medicaid *PENDING  Follow-up services arranged: Home Health: PT, OT, ST via [facility], DME: 16x[address] [employer] w/c, cushion, hospital bed, drop arm commode, 24"transfer board via [facility] and Patient/Family has no preference for HH/DME agencies  Comments (or additional information): Also provided gf with mental health and crises community resources  Patient/Family verbalized understanding of follow-up arrangements: Yes  Individual responsible for coordination of the follow-up plan: girlfriend  Confirmed correct DME delivered: [name] 03/27/2012    Mounir Skipper

## 2012-03-28 ENCOUNTER — Inpatient Hospital Stay (HOSPITAL_COMMUNITY): Payer: Self-pay | Admitting: Physical Therapy

## 2012-03-28 ENCOUNTER — Inpatient Hospital Stay (HOSPITAL_COMMUNITY): Payer: Self-pay | Admitting: Speech Pathology

## 2012-03-28 ENCOUNTER — Encounter (HOSPITAL_COMMUNITY): Payer: Self-pay | Admitting: Occupational Therapy

## 2012-03-28 ENCOUNTER — Encounter (HOSPITAL_COMMUNITY): Payer: Self-pay | Admitting: Emergency Medicine

## 2012-03-28 LAB — URINE CULTURE

## 2012-04-01 ENCOUNTER — Telehealth: Payer: Self-pay | Admitting: Physical Medicine & Rehabilitation

## 2012-04-01 NOTE — Telephone Encounter (Signed)
Please advise 

## 2012-04-01 NOTE — Telephone Encounter (Signed)
Revonda Standard is aware and will let pt wife know.

## 2012-04-01 NOTE — Telephone Encounter (Signed)
Wife wants to know if she can increase Lorazepam to twice a day.  One in the morning and one in the evening.  That is what the did in hospital and it seems to help with anxiety.

## 2012-04-01 NOTE — Telephone Encounter (Signed)
Yes she may 

## 2012-04-02 NOTE — Progress Notes (Signed)
Pt d/c'd to home in care of S/O.  Transport via EMS; VSS, c/o pain, medicated prior to d/c. Meds for home given to EMS. Carlean Purl

## 2012-04-11 ENCOUNTER — Encounter: Payer: Self-pay | Admitting: Physical Medicine & Rehabilitation

## 2012-04-11 ENCOUNTER — Other Ambulatory Visit: Payer: Self-pay | Admitting: *Deleted

## 2012-04-11 MED ORDER — TRAZODONE HCL 100 MG PO TABS: 100.0000 mg | ORAL_TABLET | Freq: Every day | ORAL | Status: DC

## 2012-04-11 MED ORDER — CARBAMAZEPINE 200 MG PO TABS: 200.0000 mg | ORAL_TABLET | Freq: Two times a day (BID) | ORAL | Status: DC

## 2012-04-11 NOTE — Telephone Encounter (Signed)
Stephen Buckley is waking up more confused than ever. Can you give him something, or does he need to see Dr Riley Kill.  (he has an appointment scheduled for 04/23/12)

## 2012-04-11 NOTE — Telephone Encounter (Signed)
Decrease the trazodone to 100mg  at bedtime. If he still is sleeping, then decrease to 50mg  for one week then off  Decrease tegretol to bid

## 2012-04-11 NOTE — Telephone Encounter (Signed)
[name] notified of Dr Rosalyn Charters instructions.

## 2012-04-16 ENCOUNTER — Telehealth: Payer: Self-pay

## 2012-04-16 NOTE — Telephone Encounter (Signed)
[facility] with [facility] called to get out patient therapy orders.  She thinks the patient may need a break from speech therapy for a week or so just to work on what they have gone over.  Please call

## 2012-04-16 NOTE — Telephone Encounter (Signed)
Verbal orders given for therapy.  He will call in a couple weeks to get new orders to start therapies again.

## 2012-04-17 ENCOUNTER — Telehealth: Payer: Self-pay | Admitting: Physical Medicine & Rehabilitation

## 2012-04-17 NOTE — Telephone Encounter (Signed)
Stephen Buckley - therapy to start week of 04/28/12.

## 2012-04-23 ENCOUNTER — Encounter: Payer: Self-pay | Admitting: Physical Medicine & Rehabilitation

## 2012-04-23 ENCOUNTER — Encounter: Payer: Medicaid Other | Attending: Physical Medicine & Rehabilitation | Admitting: Physical Medicine & Rehabilitation

## 2012-04-23 VITALS — BP 125/75 | HR 89 | Resp 14 | Ht 69.0 in | Wt 131.0 lb

## 2012-04-23 DIAGNOSIS — S72402A Unspecified fracture of lower end of left femur, initial encounter for closed fracture: Secondary | ICD-10-CM

## 2012-04-23 DIAGNOSIS — S82209B Unspecified fracture of shaft of unspecified tibia, initial encounter for open fracture type I or II: Secondary | ICD-10-CM

## 2012-04-23 DIAGNOSIS — S82209A Unspecified fracture of shaft of unspecified tibia, initial encounter for closed fracture: Secondary | ICD-10-CM | POA: Insufficient documentation

## 2012-04-23 DIAGNOSIS — S0280XA Fracture of other specified skull and facial bones, unspecified side, initial encounter for closed fracture: Secondary | ICD-10-CM | POA: Insufficient documentation

## 2012-04-23 DIAGNOSIS — S72409A Unspecified fracture of lower end of unspecified femur, initial encounter for closed fracture: Secondary | ICD-10-CM | POA: Insufficient documentation

## 2012-04-23 DIAGNOSIS — S02401A Maxillary fracture, unspecified, initial encounter for closed fracture: Secondary | ICD-10-CM | POA: Insufficient documentation

## 2012-04-23 DIAGNOSIS — X58XXXA Exposure to other specified factors, initial encounter: Secondary | ICD-10-CM | POA: Insufficient documentation

## 2012-04-23 DIAGNOSIS — S82251C Displaced comminuted fracture of shaft of right tibia, initial encounter for open fracture type IIIA, IIIB, or IIIC: Secondary | ICD-10-CM

## 2012-04-23 DIAGNOSIS — S02400A Malar fracture unspecified, initial encounter for closed fracture: Secondary | ICD-10-CM | POA: Insufficient documentation

## 2012-04-23 DIAGNOSIS — S069X9A Unspecified intracranial injury with loss of consciousness of unspecified duration, initial encounter: Secondary | ICD-10-CM

## 2012-04-23 MED ORDER — LORAZEPAM 0.5 MG PO TABS: 0.5000 mg | ORAL_TABLET | Freq: Every day | ORAL | Status: DC | PRN

## 2012-04-23 MED ORDER — TRAZODONE HCL 100 MG PO TABS: 100.0000 mg | ORAL_TABLET | Freq: Every day | ORAL | Status: DC

## 2012-04-23 MED ORDER — CARBAMAZEPINE ER 200 MG PO CP12: 200.0000 mg | ORAL_CAPSULE | Freq: Three times a day (TID) | ORAL | Status: DC

## 2012-04-23 NOTE — Patient Instructions (Signed)
YOU NEED TO GET YOUR REST!!!!!!!!   KEEP A MEMORY BOOK AND CREATE A ROUTINE TO BOOST YOUR MEMORY
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
## 2012-04-24 ENCOUNTER — Telehealth: Payer: Self-pay | Admitting: *Deleted

## 2012-04-24 MED ORDER — CARBAMAZEPINE 200 MG PO TABS: 200.0000 mg | ORAL_TABLET | Freq: Three times a day (TID) | ORAL | Status: DC

## 2012-04-24 NOTE — Telephone Encounter (Signed)
Pharmacist questioning order for tegretol placed yesterday. The order was for Carbatrol (12 hr) carbamazepine tid and he was on the regular carbamazepine tid. Was this correct?

## 2012-04-24 NOTE — Telephone Encounter (Signed)
i wrote for the regular tegretol when he left the hospital but that was not what was on his med list on epic when he came to the office on wednesday. i have written another rx with the regular tegretol

## 2012-05-06 ENCOUNTER — Telehealth: Payer: Self-pay | Admitting: Physical Medicine & Rehabilitation

## 2012-05-06 NOTE — Telephone Encounter (Signed)
Spoke with neuro rehab and they will give Stephen Buckley a call today. I notified Jake Bathe they will call.

## 2012-05-06 NOTE — Telephone Encounter (Signed)
Hasn't heard anything for Speech Therapy, PT,or OT.  Please call

## 2012-05-09 ENCOUNTER — Telehealth: Payer: Self-pay | Admitting: *Deleted

## 2012-05-09 MED ORDER — HYDROCODONE-ACETAMINOPHEN 5-325 MG PO TABS: 1.0000 | ORAL_TABLET | ORAL | Status: DC | PRN

## 2012-05-09 NOTE — Telephone Encounter (Signed)
Hydrocodone refill sent to [facility] pharmacy.  Patient made follow up appt.

## 2012-05-09 NOTE — Telephone Encounter (Signed)
Received call from [facility] that patient needs a refill on his Hydrocodone. She also wanted to let the Dr. Ashley Mariner that it is not really helping with Leonette Most pain and would like to know if something else could be Rx'd.

## 2012-05-14 ENCOUNTER — Ambulatory Visit: Payer: Medicaid Other | Attending: Physical Medicine & Rehabilitation | Admitting: Occupational Therapy

## 2012-05-14 ENCOUNTER — Encounter: Payer: Self-pay | Admitting: Occupational Therapy

## 2012-05-14 ENCOUNTER — Ambulatory Visit: Payer: Medicaid Other | Admitting: Physical Therapy

## 2012-05-14 ENCOUNTER — Ambulatory Visit: Payer: Self-pay | Admitting: Physical Therapy

## 2012-05-14 ENCOUNTER — Ambulatory Visit: Payer: Medicaid Other

## 2012-05-14 DIAGNOSIS — I69919 Unspecified symptoms and signs involving cognitive functions following unspecified cerebrovascular disease: Secondary | ICD-10-CM | POA: Insufficient documentation

## 2012-05-14 DIAGNOSIS — Z5189 Encounter for other specified aftercare: Secondary | ICD-10-CM | POA: Insufficient documentation

## 2012-05-14 DIAGNOSIS — R269 Unspecified abnormalities of gait and mobility: Secondary | ICD-10-CM | POA: Insufficient documentation

## 2012-05-14 DIAGNOSIS — I69998 Other sequelae following unspecified cerebrovascular disease: Secondary | ICD-10-CM | POA: Insufficient documentation

## 2012-05-14 DIAGNOSIS — M6281 Muscle weakness (generalized): Secondary | ICD-10-CM | POA: Insufficient documentation

## 2012-05-25 DIAGNOSIS — R58 Hemorrhage, not elsewhere classified: Secondary | ICD-10-CM

## 2012-05-25 HISTORY — DX: Hemorrhage, not elsewhere classified: R58

## 2012-05-27 ENCOUNTER — Ambulatory Visit: Payer: Medicaid Other | Attending: Physical Medicine & Rehabilitation | Admitting: Occupational Therapy

## 2012-05-27 ENCOUNTER — Ambulatory Visit: Payer: Medicaid Other | Admitting: Speech Pathology

## 2012-05-27 DIAGNOSIS — I69998 Other sequelae following unspecified cerebrovascular disease: Secondary | ICD-10-CM | POA: Insufficient documentation

## 2012-05-27 DIAGNOSIS — Z5189 Encounter for other specified aftercare: Secondary | ICD-10-CM | POA: Insufficient documentation

## 2012-05-27 DIAGNOSIS — I69919 Unspecified symptoms and signs involving cognitive functions following unspecified cerebrovascular disease: Secondary | ICD-10-CM | POA: Insufficient documentation

## 2012-05-27 DIAGNOSIS — R269 Unspecified abnormalities of gait and mobility: Secondary | ICD-10-CM | POA: Insufficient documentation

## 2012-05-27 DIAGNOSIS — M6281 Muscle weakness (generalized): Secondary | ICD-10-CM | POA: Insufficient documentation

## 2012-05-29 ENCOUNTER — Ambulatory Visit: Payer: Medicaid Other | Admitting: Occupational Therapy

## 2012-05-29 ENCOUNTER — Ambulatory Visit: Payer: Medicaid Other | Admitting: Physical Therapy

## 2012-05-29 ENCOUNTER — Ambulatory Visit: Payer: Medicaid Other | Admitting: Speech Pathology

## 2012-06-02 ENCOUNTER — Ambulatory Visit: Payer: Self-pay | Admitting: Physical Therapy

## 2012-06-02 ENCOUNTER — Encounter: Payer: Self-pay | Admitting: Occupational Therapy

## 2012-06-03 ENCOUNTER — Encounter: Payer: Medicaid Other | Attending: Physical Medicine & Rehabilitation | Admitting: Physical Medicine & Rehabilitation

## 2012-06-03 DIAGNOSIS — S0280XA Fracture of other specified skull and facial bones, unspecified side, initial encounter for closed fracture: Secondary | ICD-10-CM | POA: Insufficient documentation

## 2012-06-03 DIAGNOSIS — S72409A Unspecified fracture of lower end of unspecified femur, initial encounter for closed fracture: Secondary | ICD-10-CM | POA: Insufficient documentation

## 2012-06-03 DIAGNOSIS — S02401A Maxillary fracture, unspecified, initial encounter for closed fracture: Secondary | ICD-10-CM | POA: Insufficient documentation

## 2012-06-03 DIAGNOSIS — S82209A Unspecified fracture of shaft of unspecified tibia, initial encounter for closed fracture: Secondary | ICD-10-CM | POA: Insufficient documentation

## 2012-06-03 DIAGNOSIS — X58XXXA Exposure to other specified factors, initial encounter: Secondary | ICD-10-CM | POA: Insufficient documentation

## 2012-06-04 ENCOUNTER — Ambulatory Visit: Payer: Medicaid Other

## 2012-06-04 ENCOUNTER — Ambulatory Visit: Payer: Medicaid Other | Admitting: Occupational Therapy

## 2012-06-04 ENCOUNTER — Ambulatory Visit: Payer: Medicaid Other | Admitting: Physical Therapy

## 2012-06-09 ENCOUNTER — Ambulatory Visit: Payer: Medicaid Other | Admitting: Speech Pathology

## 2012-06-09 ENCOUNTER — Ambulatory Visit: Payer: Medicaid Other | Admitting: Physical Therapy

## 2012-06-09 ENCOUNTER — Ambulatory Visit: Payer: Medicaid Other | Admitting: Occupational Therapy

## 2012-06-11 ENCOUNTER — Ambulatory Visit: Payer: Medicaid Other | Admitting: Physical Therapy

## 2012-06-11 ENCOUNTER — Ambulatory Visit: Payer: Medicaid Other | Admitting: Speech Pathology

## 2012-06-11 ENCOUNTER — Ambulatory Visit: Payer: Medicaid Other | Admitting: Occupational Therapy

## 2012-06-11 ENCOUNTER — Ambulatory Visit: Payer: Self-pay | Admitting: Physical Therapy

## 2012-06-16 ENCOUNTER — Ambulatory Visit: Payer: Medicaid Other | Admitting: Physical Therapy

## 2012-06-16 ENCOUNTER — Ambulatory Visit: Payer: Medicaid Other | Admitting: Speech Pathology

## 2012-06-16 ENCOUNTER — Ambulatory Visit: Payer: Medicaid Other | Admitting: Occupational Therapy

## 2012-06-19 ENCOUNTER — Ambulatory Visit: Payer: Medicaid Other | Admitting: Physical Therapy

## 2012-06-19 ENCOUNTER — Ambulatory Visit: Payer: Medicaid Other

## 2012-06-19 ENCOUNTER — Ambulatory Visit: Payer: Medicaid Other | Admitting: Occupational Therapy

## 2012-06-20 ENCOUNTER — Other Ambulatory Visit (HOSPITAL_COMMUNITY): Payer: Self-pay | Admitting: Neurosurgery

## 2012-06-20 DIAGNOSIS — I609 Nontraumatic subarachnoid hemorrhage, unspecified: Secondary | ICD-10-CM

## 2012-06-24 ENCOUNTER — Ambulatory Visit (HOSPITAL_COMMUNITY)
Admission: RE | Admit: 2012-06-24 | Discharge: 2012-06-24 | Disposition: A | Discharge status: Home / Self Care | Payer: Medicaid Other | Source: Ambulatory Visit | Attending: Neurosurgery | Admitting: Neurosurgery

## 2012-06-24 DIAGNOSIS — R51 Headache: Secondary | ICD-10-CM | POA: Insufficient documentation

## 2012-06-24 DIAGNOSIS — I609 Nontraumatic subarachnoid hemorrhage, unspecified: Secondary | ICD-10-CM | POA: Insufficient documentation

## 2012-06-24 DIAGNOSIS — F29 Unspecified psychosis not due to a substance or known physiological condition: Secondary | ICD-10-CM | POA: Insufficient documentation

## 2012-06-24 DIAGNOSIS — H538 Other visual disturbances: Secondary | ICD-10-CM | POA: Insufficient documentation

## 2012-06-30 ENCOUNTER — Ambulatory Visit: Payer: Medicaid Other | Attending: Physical Medicine & Rehabilitation | Admitting: Occupational Therapy

## 2012-06-30 ENCOUNTER — Ambulatory Visit: Payer: Medicaid Other

## 2012-06-30 ENCOUNTER — Ambulatory Visit: Payer: Medicaid Other | Admitting: Physical Therapy

## 2012-06-30 DIAGNOSIS — M6281 Muscle weakness (generalized): Secondary | ICD-10-CM | POA: Insufficient documentation

## 2012-06-30 DIAGNOSIS — I69998 Other sequelae following unspecified cerebrovascular disease: Secondary | ICD-10-CM | POA: Insufficient documentation

## 2012-06-30 DIAGNOSIS — I69919 Unspecified symptoms and signs involving cognitive functions following unspecified cerebrovascular disease: Secondary | ICD-10-CM | POA: Insufficient documentation

## 2012-06-30 DIAGNOSIS — Z5189 Encounter for other specified aftercare: Secondary | ICD-10-CM | POA: Insufficient documentation

## 2012-06-30 DIAGNOSIS — R269 Unspecified abnormalities of gait and mobility: Secondary | ICD-10-CM | POA: Insufficient documentation

## 2012-07-15 ENCOUNTER — Encounter: Payer: Medicaid Other | Attending: Physical Medicine & Rehabilitation | Admitting: Physical Medicine & Rehabilitation

## 2012-07-15 ENCOUNTER — Encounter: Payer: Self-pay | Admitting: Physical Medicine & Rehabilitation

## 2012-07-15 VITALS — BP 112/76 | HR 77 | Resp 14 | Ht 69.0 in | Wt 136.8 lb

## 2012-07-15 DIAGNOSIS — S82251C Displaced comminuted fracture of shaft of right tibia, initial encounter for open fracture type IIIA, IIIB, or IIIC: Secondary | ICD-10-CM

## 2012-07-15 DIAGNOSIS — F6381 Intermittent explosive disorder: Secondary | ICD-10-CM | POA: Insufficient documentation

## 2012-07-15 DIAGNOSIS — S82209B Unspecified fracture of shaft of unspecified tibia, initial encounter for open fracture type I or II: Secondary | ICD-10-CM

## 2012-07-15 DIAGNOSIS — Z5181 Encounter for therapeutic drug level monitoring: Secondary | ICD-10-CM | POA: Insufficient documentation

## 2012-07-15 DIAGNOSIS — X58XXXA Exposure to other specified factors, initial encounter: Secondary | ICD-10-CM | POA: Insufficient documentation

## 2012-07-15 DIAGNOSIS — S069XAA Unspecified intracranial injury with loss of consciousness status unknown, initial encounter: Secondary | ICD-10-CM | POA: Insufficient documentation

## 2012-07-15 DIAGNOSIS — S069X9A Unspecified intracranial injury with loss of consciousness of unspecified duration, initial encounter: Secondary | ICD-10-CM

## 2012-07-15 NOTE — Patient Instructions (Signed)
YOU NEED TO TAKE YOUR MEDS AS PRESCRIBED!!!!!!  YOUR MEDICATION IS TO HELP CONTROL YOUR MOOD AND BEHAVIOR.

## 2012-07-15 NOTE — Progress Notes (Signed)
Subjective:    Patient ID: Stephen Buckley, male    DOB: 1950/12/04, 62 y.o.   MRN: [id number]  HPI  Gram is back regarding his TBI. Wife reports increased agitation at home. He is not taking meds. He has stopped going to therapy. He has eye surgery scheduled for later this month to balance out his extra ocular muscles.   Digby reports irritability before his injury. His sleep is better.   He reports pain in his right  Leg, just distal to his knee, mid tibial shaft.  He sees Dr. Gerlene Fee for f/u.  His CT scan from 12/31 shows a new subacute/chroni area of bleeding on the left temporal lobe  Pain Inventory Average Pain 7 Pain Right Now 7 My pain is sharp and aching  In the last 24 hours, has pain interfered with the following? General activity 5 Relation with others 5 Enjoyment of life 6 What TIME of day is your pain at its worst? morning Sleep (in general) Fair  Pain is worse with: walking and standing Pain improves with: medication Relief from Meds: 2  Mobility use a cane ability to climb steps?  yes  Function disabled: date disabled 2008 I need assistance with the following:  meal prep and shopping  Neuro/Psych bladder control problems  Prior Studies Any changes since last visit?  no  Physicians involved in your care Any changes since last visit?  no   History reviewed. No pertinent family history. History   Social History  . Marital Status: Single    Spouse Name: N/A    Number of Children: N/A  . Years of Education: N/A   Social History Main Topics  . Smoking status: Current Every Day Smoker -- 0.5 packs/day for 20 years    Types: Cigarettes  . Smokeless tobacco: Never Used  . Alcohol Use: No     Comment: occausional  . Drug Use: 3 per week    Special: Marijuana     Comment: pt family states "he might smoke a joint every once in awhile"  . Sexually Active: Yes    Birth Control/ Protection: Condom   Other Topics Concern  . None    Social History Narrative   ** Merged History Encounter **    Past Surgical History  Procedure Date  . Tibia im nail insertion 02/21/2012    Procedure: INTRAMEDULLARY (IM) NAIL TIBIAL;  Surgeon: Budd Palmer, MD;  Location: [facility] OR;  Service: Orthopedics;  Laterality: Right;  . I&d extremity 02/21/2012    Procedure: IRRIGATION AND DEBRIDEMENT EXTREMITY;  Surgeon: Budd Palmer, MD;  Location: [facility] OR;  Service: Orthopedics;  Laterality: Right;  . Femur im nail 02/21/2012    Procedure: INTRAMEDULLARY (IM) RETROGRADE FEMORAL NAILING;  Surgeon: Budd Palmer, MD;  Location: [facility] OR;  Service: Orthopedics;  Laterality: Left;  . Application of wound vac 02/21/2012    Procedure: APPLICATION OF WOUND VAC;  Surgeon: Budd Palmer, MD;  Location: [facility] OR;  Service: Orthopedics;  Laterality: Right;  . Fasciotomy 02/21/2012    Procedure: FASCIOTOMY;  Surgeon: Budd Palmer, MD;  Location: [facility] OR;  Service: Orthopedics;  Laterality: Right;  start of Procedure:  19:54-End: 20:46  . Femoral artery exploration 02/21/2012    Procedure: FEMORAL ARTERY EXPLORATION;  Surgeon: Budd Palmer, MD;  Location: [facility] OR;  Service: Orthopedics;  Laterality: Right;  Right Femoral Artery Cutdown.    Start of Case:  19:58-End: 20:40   Past Medical History  Diagnosis Date  .  Hypertension   . Depression   . Acid reflux   . Open displaced comminuted fracture of shaft of right tibia, type III 02/22/2012  . Closed fracture of left distal femur 02/22/2012   BP 112/76  Pulse 77  Resp 14  Ht 5\' 9"  (1.753 m)  Wt 136 lb 12.8 oz (62.052 kg)  BMI 20.20 kg/m2  SpO2 98%   Review of Systems  Cardiovascular: Positive for leg swelling.  Genitourinary:       Bladder control problems   Musculoskeletal:       Knee pain  All other systems reviewed and are negative.       Objective:   Physical Exam General: Alert and oriented x 3, No apparent distress  HEENT: Head is normocephalic, atraumatic, PERRLA, EOMI, sclera  anicteric, oral mucosa pink and moist, dentition poor, ext ear canals clear, no irritation of the sclera, pupil, cornea, etc  Neck: Supple without JVD or lymphadenopathy  Heart: Reg rate and rhythm. No murmurs rubs or gallops  Chest: CTA bilaterally without wheezes, rales, or rhonchi; no distress  Abdomen: Soft, non-tender, non-distended, bowel sounds positive.  Extremities: No clubbing, cyanosis, or edema. Pulses are 2+  Skin: Clean and intact without signs of breakdown. Old incisions clean and dry  Neuro:oriented to place, day, month with cues. Follows all simple commands. Is distratcted and restless at times. stm deficits. Moves all 4's equally.  Favors right leg with walking, mild antalgia. Uses forward posture when standing in walker. No gross CN ddeficits. Visual acuity near baseline Musculoskeletal: Full ROM, No pain with AROM or PROM in the neck, trunk, or extremities. Posture appropriate  Psych: Pt's affect is appropriate. Pt is cooperative   Assessment & Plan:   1. Severe TBI with skull fx  2. Left maxiallary fx  3. Left distal femur fx,Right tibial fx  4. Emotional dyscontrol syndrome  Plan:  1. The patient NEEDS TO TAKE MEDS. WE DISCUSSED THIS AT LENGTH TODAY. I made him sign a statement in this regard so that he can refer to it when he's in doubt about his medications. 2. Will reintroduce trazodone for sleep. Also mentioned to the patient that he needs to TRY to sleep, and has to have an environment conducive to sleep  3. Eye surgery planned for dysconjugate gaze. NS follow up later this moth. Probably needs a repeat CT scan this month. Extreme safety precaution also needed. 4. I'll see him back in about 3 months. 30 minutes of face to face patient care time were spent during this visit. All questions were encouraged and answered.

## 2012-08-05 ENCOUNTER — Encounter (HOSPITAL_BASED_OUTPATIENT_CLINIC_OR_DEPARTMENT_OTHER): Payer: Self-pay | Admitting: Ophthalmology
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
## 2012-08-15 ENCOUNTER — Encounter (HOSPITAL_BASED_OUTPATIENT_CLINIC_OR_DEPARTMENT_OTHER): Payer: Self-pay | Admitting: *Deleted

## 2012-08-15 NOTE — Progress Notes (Signed)
Pt's friend answered questions w pt in the background assisting.  Instructions given for pt to be npo p mn 2/25 x ativan, epitol w sip of water.  To [facility] 2/26 2/24 for cxr and ekg.  To [facility] 2/26 @ 0700.  Needs hgb on arrival.

## 2012-08-18 ENCOUNTER — Encounter (HOSPITAL_BASED_OUTPATIENT_CLINIC_OR_DEPARTMENT_OTHER)
Admission: RE | Admit: 2012-08-18 | Discharge: 2012-08-18 | Disposition: A | Discharge status: Home / Self Care | Payer: Medicaid Other | Source: Ambulatory Visit | Attending: Ophthalmology | Admitting: Ophthalmology

## 2012-08-18 ENCOUNTER — Ambulatory Visit (HOSPITAL_BASED_OUTPATIENT_CLINIC_OR_DEPARTMENT_OTHER)
Admission: RE | Admit: 2012-08-18 | Discharge: 2012-08-18 | Disposition: A | Discharge status: Home / Self Care | Payer: Medicaid Other | Source: Ambulatory Visit | Attending: Ophthalmology | Admitting: Ophthalmology

## 2012-08-18 DIAGNOSIS — Z01818 Encounter for other preprocedural examination: Secondary | ICD-10-CM | POA: Insufficient documentation

## 2012-08-18 DIAGNOSIS — R9431 Abnormal electrocardiogram [ECG] [EKG]: Secondary | ICD-10-CM | POA: Insufficient documentation
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
## 2012-08-19 NOTE — Interval H&P Note (Signed)
History and Physical Interval Note:  08/19/2012 8:50 PM  Stephen Buckley  has presented today for surgery, with the diagnosis of diplopia   The various methods of treatment have been discussed with the patient and family. After consideration of risks, benefits and other options for treatment, the patient has consented to  Procedure(s) with comments: REPAIR STRABISMUS (Left) - Inferior oblique myectomy left eye  as a surgical intervention .  The patient's history has been reviewed, patient examined, no change in status, stable for surgery.  I have reviewed the patient's chart and labs.  Questions were answered to the patient's satisfaction.     Carime Dinkel A

## 2012-08-20 ENCOUNTER — Encounter (HOSPITAL_BASED_OUTPATIENT_CLINIC_OR_DEPARTMENT_OTHER)
Admission: RE | Disposition: A | Discharge status: Home / Self Care | Payer: Self-pay | Source: Ambulatory Visit | Attending: Ophthalmology

## 2012-08-20 ENCOUNTER — Ambulatory Visit (HOSPITAL_BASED_OUTPATIENT_CLINIC_OR_DEPARTMENT_OTHER): Payer: Medicaid Other | Admitting: Anesthesiology

## 2012-08-20 ENCOUNTER — Encounter (HOSPITAL_BASED_OUTPATIENT_CLINIC_OR_DEPARTMENT_OTHER): Payer: Self-pay | Admitting: Anesthesiology

## 2012-08-20 ENCOUNTER — Ambulatory Visit (HOSPITAL_BASED_OUTPATIENT_CLINIC_OR_DEPARTMENT_OTHER)
Admission: RE | Admit: 2012-08-20 | Discharge: 2012-08-20 | Disposition: A | Discharge status: Home / Self Care | Payer: Medicaid Other | Source: Ambulatory Visit | Attending: Ophthalmology | Admitting: Ophthalmology

## 2012-08-20 ENCOUNTER — Encounter (HOSPITAL_BASED_OUTPATIENT_CLINIC_OR_DEPARTMENT_OTHER): Payer: Self-pay | Admitting: *Deleted

## 2012-08-20 DIAGNOSIS — H4910 Fourth [trochlear] nerve palsy, unspecified eye: Secondary | ICD-10-CM | POA: Insufficient documentation

## 2012-08-20 DIAGNOSIS — F6381 Intermittent explosive disorder: Secondary | ICD-10-CM

## 2012-08-20 DIAGNOSIS — S0231XS Fracture of orbital floor, right side, sequela: Secondary | ICD-10-CM

## 2012-08-20 DIAGNOSIS — S065X9S Traumatic subdural hemorrhage with loss of consciousness of unspecified duration, sequela: Secondary | ICD-10-CM

## 2012-08-20 DIAGNOSIS — S065XAS Traumatic subdural hemorrhage with loss of consciousness status unknown, sequela: Secondary | ICD-10-CM

## 2012-08-20 DIAGNOSIS — K219 Gastro-esophageal reflux disease without esophagitis: Secondary | ICD-10-CM | POA: Insufficient documentation

## 2012-08-20 DIAGNOSIS — B1920 Unspecified viral hepatitis C without hepatic coma: Secondary | ICD-10-CM | POA: Insufficient documentation

## 2012-08-20 DIAGNOSIS — S82251S Displaced comminuted fracture of shaft of right tibia, sequela: Secondary | ICD-10-CM

## 2012-08-20 DIAGNOSIS — H409 Unspecified glaucoma: Secondary | ICD-10-CM | POA: Insufficient documentation

## 2012-08-20 DIAGNOSIS — H532 Diplopia: Secondary | ICD-10-CM | POA: Insufficient documentation

## 2012-08-20 DIAGNOSIS — D62 Acute posthemorrhagic anemia: Secondary | ICD-10-CM

## 2012-08-20 DIAGNOSIS — S0240DS Maxillary fracture, left side, sequela: Secondary | ICD-10-CM

## 2012-08-20 DIAGNOSIS — S066X9S Traumatic subarachnoid hemorrhage with loss of consciousness of unspecified duration, sequela: Secondary | ICD-10-CM

## 2012-08-20 DIAGNOSIS — H519 Unspecified disorder of binocular movement: Secondary | ICD-10-CM | POA: Insufficient documentation

## 2012-08-20 DIAGNOSIS — J95821 Acute postprocedural respiratory failure: Secondary | ICD-10-CM

## 2012-08-20 DIAGNOSIS — F17200 Nicotine dependence, unspecified, uncomplicated: Secondary | ICD-10-CM | POA: Insufficient documentation

## 2012-08-20 DIAGNOSIS — S069X0S Unspecified intracranial injury without loss of consciousness, sequela: Secondary | ICD-10-CM

## 2012-08-20 HISTORY — PX: STRABISMUS SURGERY: SHX218

## 2012-08-20 HISTORY — DX: Maxillary fracture, unspecified side, initial encounter for closed fracture: S02.401A

## 2012-08-20 HISTORY — DX: Personal history of other medical treatment: Z92.89

## 2012-08-20 HISTORY — DX: Hemorrhage, not elsewhere classified: R58

## 2012-08-20 HISTORY — DX: Unspecified glaucoma: H40.9

## 2012-08-20 HISTORY — DX: Constipation, unspecified: K59.00

## 2012-08-20 HISTORY — DX: Unspecified asthma, uncomplicated: J45.909

## 2012-08-20 HISTORY — DX: Malar fracture, unspecified side, initial encounter for closed fracture: S02.400A

## 2012-08-20 HISTORY — DX: Unspecified intracranial injury with loss of consciousness of unspecified duration, initial encounter: S06.9X9A

## 2012-08-20 HISTORY — DX: Other neuromuscular dysfunction of bladder: N31.8

## 2012-08-20 HISTORY — DX: Inflammatory liver disease, unspecified: K75.9

## 2012-08-20 LAB — POCT HEMOGLOBIN-HEMACUE: Hemoglobin: 15.2 g/dL (ref 13.0–17.0)

## 2012-08-20 SURGERY — REPAIR STRABISMUS
Anesthesia: General | Site: Eye | Laterality: Left | Wound class: Clean

## 2012-08-20 MED ORDER — ONDANSETRON HCL 4 MG/2ML IJ SOLN
INTRAMUSCULAR | Status: DC | PRN
  Administered 2012-08-20: 4 mg via INTRAVENOUS

## 2012-08-20 MED ORDER — LACTATED RINGERS IV SOLN
INTRAVENOUS | Status: DC
Start: 2012-08-20 — End: 2012-08-20
  Administered 2012-08-20 (×2): via INTRAVENOUS
  Filled 2012-08-20: qty 1000

## 2012-08-20 MED ORDER — TOBRAMYCIN-DEXAMETHASONE 0.3-0.1 % OP OINT: TOPICAL_OINTMENT | Freq: Two times a day (BID) | OPHTHALMIC | Status: DC

## 2012-08-20 MED ORDER — FENTANYL CITRATE 0.05 MG/ML IJ SOLN
INTRAMUSCULAR | Status: DC | PRN
  Administered 2012-08-20 (×2): 25 ug via INTRAVENOUS
  Administered 2012-08-20: 50 ug via INTRAVENOUS
  Administered 2012-08-20: 25 ug via INTRAVENOUS

## 2012-08-20 MED ORDER — SUCCINYLCHOLINE CHLORIDE 20 MG/ML IJ SOLN
INTRAMUSCULAR | Status: DC | PRN
  Administered 2012-08-20: 180 mg via INTRAVENOUS

## 2012-08-20 MED ORDER — LIDOCAINE HCL (CARDIAC) 20 MG/ML IV SOLN
INTRAVENOUS | Status: DC | PRN
  Administered 2012-08-20: 80 mg via INTRAVENOUS

## 2012-08-20 MED ORDER — FENTANYL CITRATE 0.05 MG/ML IJ SOLN
25.0000 ug | INTRAMUSCULAR | Status: DC | PRN
  Filled 2012-08-20: qty 1

## 2012-08-20 MED ORDER — HYDROCODONE-ACETAMINOPHEN 5-325 MG PO TABS
1.0000 | ORAL_TABLET | ORAL | Status: AC | PRN
Start: 2012-08-20 — End: 2012-08-25

## 2012-08-20 MED ORDER — BSS IO SOLN
INTRAOCULAR | Status: DC | PRN
  Administered 2012-08-20: 30 mL via INTRAOCULAR

## 2012-08-20 MED ORDER — KETOROLAC TROMETHAMINE 30 MG/ML IJ SOLN
15.0000 mg | Freq: Once | INTRAMUSCULAR | Status: DC | PRN
  Filled 2012-08-20: qty 1

## 2012-08-20 MED ORDER — PROPOFOL 10 MG/ML IV BOLUS
INTRAVENOUS | Status: DC | PRN
  Administered 2012-08-20: 200 mg via INTRAVENOUS

## 2012-08-20 MED ORDER — PHENYLEPHRINE HCL 2.5 % OP SOLN
OPHTHALMIC | Status: DC | PRN
  Administered 2012-08-20: 3 [drp] via OPHTHALMIC

## 2012-08-20 MED ORDER — MIDAZOLAM HCL 5 MG/5ML IJ SOLN
INTRAMUSCULAR | Status: DC | PRN
  Administered 2012-08-20: 0.5 mg via INTRAVENOUS

## 2012-08-20 MED ORDER — TOBRAMYCIN 0.3 % OP OINT
TOPICAL_OINTMENT | OPHTHALMIC | Status: DC | PRN
  Administered 2012-08-20: 1 via OPHTHALMIC

## 2012-08-20 MED ORDER — PROMETHAZINE HCL 25 MG/ML IJ SOLN
6.2500 mg | INTRAMUSCULAR | Status: DC | PRN
  Filled 2012-08-20: qty 1

## 2012-08-20 MED ORDER — KETOROLAC TROMETHAMINE 30 MG/ML IJ SOLN
INTRAMUSCULAR | Status: DC | PRN
  Administered 2012-08-20: 30 mg via INTRAVENOUS

## 2012-08-20 SURGICAL SUPPLY — 28 items
APL SRG 3 HI ABS STRL LF PLS (MISCELLANEOUS) ×1
APPLICATOR DR MATTHEWS STRL (MISCELLANEOUS) ×2 IMPLANT
CAUTERY EYE LOW TEMP 1300F FIN (OPHTHALMIC RELATED) ×2 IMPLANT
CLOTH BEACON ORANGE TIMEOUT ST (SAFETY) ×2 IMPLANT
CORDS BIPOLAR (ELECTRODE) IMPLANT
COVER MAYO STAND STRL (DRAPES) ×2 IMPLANT
COVER TABLE BACK 60X90 (DRAPES) ×2 IMPLANT
DRAPE LG THREE QUARTER DISP (DRAPES) ×2 IMPLANT
DRAPE SURG 17X23 STRL (DRAPES) ×6 IMPLANT
GLOVE BIO SURGEON STRL SZ7.5 (GLOVE) ×1 IMPLANT
GLOVE BIOGEL PI IND STRL 6.5 (GLOVE) IMPLANT
GLOVE BIOGEL PI IND STRL 7.0 (GLOVE) IMPLANT
GLOVE BIOGEL PI INDICATOR 6.5 (GLOVE) ×1
GLOVE BIOGEL PI INDICATOR 7.0 (GLOVE) ×1
GLOVE SURG SIGNA 7.5 PF LTX (GLOVE) ×2 IMPLANT
GOWN PREVENTION PLUS LG XLONG (DISPOSABLE) ×2 IMPLANT
NS IRRIG 500ML POUR BTL (IV SOLUTION) ×2 IMPLANT
PACK BASIN DAY SURGERY FS (CUSTOM PROCEDURE TRAY) ×2 IMPLANT
PAD EYE OVAL STERILE LF (GAUZE/BANDAGES/DRESSINGS) IMPLANT
SPEAR EYE SURGICAL ST (MISCELLANEOUS) IMPLANT
STRIP CLOSURE SKIN 1/2X4 (GAUZE/BANDAGES/DRESSINGS) ×2 IMPLANT
SUT MERSILENE 6 0 S14 DA (SUTURE) IMPLANT
SUT VICRYL 6 0 S 29 12 (SUTURE) ×2 IMPLANT
SUT VICRYL 7 0 TG140 8 (SUTURE) IMPLANT
SUT VICRYL 8 0 TG140 8 (SUTURE) IMPLANT
TOWEL OR 17X24 6PK STRL BLUE (TOWEL DISPOSABLE) ×2 IMPLANT
TRAY DSU PREP LF (CUSTOM PROCEDURE TRAY) ×2 IMPLANT
WATER STERILE IRR 500ML POUR (IV SOLUTION) ×1 IMPLANT

## 2012-08-20 NOTE — Anesthesia Preprocedure Evaluation (Addendum)
Anesthesia Evaluation  Patient identified by MRN, date of birth, ID band Patient awake    Reviewed: Allergy & Precautions, H&P , NPO status , Patient's Chart, lab work & pertinent test results  Airway Mallampati: II TM Distance: >3 FB Neck ROM: Full    Dental no notable dental hx.    Pulmonary Current Smoker,  breath sounds clear to auscultation  Pulmonary exam normal       Cardiovascular hypertension, Pt. on medications Rhythm:Regular Rate:Normal     Neuro/Psych negative neurological ROS  negative psych ROS   GI/Hepatic GERD-  Medicated,(+) Hepatitis -, C  Endo/Other  negative endocrine ROS  Renal/GU negative Renal ROS  negative genitourinary   Musculoskeletal negative musculoskeletal ROS (+)   Abdominal   Peds negative pediatric ROS (+)  Hematology negative hematology ROS (+)   Anesthesia Other Findings   Reproductive/Obstetrics negative OB ROS                          Anesthesia Physical Anesthesia Plan  ASA: III  Anesthesia Plan: General   Post-op Pain Management:    Induction: Intravenous  Airway Management Planned: Oral ETT  Additional Equipment:   Intra-op Plan:   Post-operative Plan: Extubation in OR  Informed Consent: I have reviewed the patients History and Physical, chart, labs and discussed the procedure including the risks, benefits and alternatives for the proposed anesthesia with the patient or authorized representative who has indicated his/her understanding and acceptance.   Dental advisory given  Plan Discussed with: CRNA and Surgeon  Anesthesia Plan Comments:        Anesthesia Quick Evaluation

## 2012-08-20 NOTE — Anesthesia Postprocedure Evaluation (Signed)
  Anesthesia Post-op Note  Patient: Stephen Buckley  Procedure(s) Performed: Procedure(s) (LRB): REPAIR STRABISMUS (Left)  Patient Location: PACU  Anesthesia Type: MAC  Level of Consciousness: awake and alert   Airway and Oxygen Therapy: Patient Spontanous Breathing  Post-op Pain: mild  Post-op Assessment: Post-op Vital signs reviewed, Patient's Cardiovascular Status Stable, Respiratory Function Stable, Patent Airway and No signs of Nausea or vomiting  Last Vitals:  Filed Vitals:   08/20/12 0945  BP: 154/88  Pulse: 73  Temp:   Resp: 13    Post-op Vital Signs: stable   Complications: No apparent anesthesia complications

## 2012-08-20 NOTE — Anesthesia Procedure Notes (Signed)
Procedure Name: Intubation Date/Time: 08/20/2012 8:43 AM Performed by: Fran Lowes Pre-anesthesia Checklist: Patient identified, Emergency Drugs available, Suction available and Patient being monitored Patient Re-evaluated:Patient Re-evaluated prior to inductionOxygen Delivery Method: Circle System Utilized Preoxygenation: Pre-oxygenation with 100% oxygen Intubation Type: IV induction Ventilation: Mask ventilation without difficulty Laryngoscope Size: Mac and 4 Grade View: Grade I Tube type: Oral Tube size: 8.0 mm Number of attempts: 1 Airway Equipment and Method: stylet,  oral airway and LTA kit utilized Placement Confirmation: ETT inserted through vocal cords under direct vision,  positive ETCO2 and breath sounds checked- equal and bilateral Secured at: 22 cm Tube secured with: Tape Dental Injury: Teeth and Oropharynx as per pre-operative assessment
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
## 2012-08-20 NOTE — Transfer of Care (Signed)
  Immediate Anesthesia Transfer of Care Note  Patient: Stephen Buckley  Procedure(s) Performed: Procedure(s) (LRB): REPAIR STRABISMUS (Left)  Patient Location: Patient transported to PACU with oxygen via face mask at 4 Liters / Min  Anesthesia Type: General  Level of Consciousness: awake and alert   Airway & Oxygen Therapy: Patient Spontanous Breathing and Patient connected to face mask oxygen  Post-op Assessment: Report given to PACU RN and Post -op Vital signs reviewed and stable  Post vital signs: Reviewed and stable  Dentition: Teeth and oropharynx remain in pre-op condition  Complications: No apparent anesthesia complications

## 2012-08-20 NOTE — Brief Op Note (Signed)
08/20/2012  9:18 AM  PATIENT:  Stephen Buckley  62 y.o. male  PRE-OPERATIVE DIAGNOSIS:  diplopia   POST-OPERATIVE DIAGNOSIS:  diplopia   PROCEDURE:  Procedure(s) with comments: REPAIR STRABISMUS (Left) - Inferior oblique myectomy left eye   SURGEON:  Surgeon(s) and Role:    * Corinda Gubler, MD - Primary  PHYSICIAN ASSISTANT:   ASSISTANTS: none   ANESTHESIA:   none  EBL:  Total I/O In: 250 [I.V.:250] Out: -   BLOOD ADMINISTERED:none  DRAINS: none   LOCAL MEDICATIONS USED:  NONE  SPECIMEN:  No Specimen  DISPOSITION OF SPECIMEN:  N/A  COUNTS:  YES  TOURNIQUET:  * No tourniquets in log *  DICTATION: .Other Dictation: Dictation Number [id number]  PLAN OF CARE: Discharge to home after PACU  PATIENT DISPOSITION:  PACU - hemodynamically stable.   Delay start of Pharmacological VTE agent (>24hrs) due to surgical blood loss or risk of bleeding: not applicable

## 2012-08-21 ENCOUNTER — Encounter (HOSPITAL_BASED_OUTPATIENT_CLINIC_OR_DEPARTMENT_OTHER): Payer: Self-pay | Admitting: Ophthalmology

## 2012-08-23 ENCOUNTER — Other Ambulatory Visit: Payer: Self-pay | Admitting: Physical Medicine & Rehabilitation

## 2012-08-25 NOTE — Telephone Encounter (Signed)
Do you want to refill the lorazepam?

## 2012-08-26 ENCOUNTER — Telehealth: Payer: Self-pay

## 2012-08-26 NOTE — Telephone Encounter (Signed)
Xanax: i will refill this one time with one additional RF. Further refills require office visit.

## 2012-08-26 NOTE — Telephone Encounter (Signed)
Stephen Buckley called requesting refill on patients anxiety medicine and enough to last him 30 days because he takes it twice a day and he also needs his headache medicine refilled at [facility] pharmacy on [address].  Stephen Buckley says he needs hydrocodone filled.  Please advise.

## 2012-08-27 ENCOUNTER — Telehealth: Payer: Self-pay

## 2012-08-27 MED ORDER — HYDROCODONE-ACETAMINOPHEN 5-325 MG PO TABS
1.0000 | ORAL_TABLET | Freq: Four times a day (QID) | ORAL | Status: DC | PRN
Start: 2012-08-27 — End: 2013-03-01

## 2012-08-27 MED ORDER — HYDROCODONE-ACETAMINOPHEN 5-325 MG PO TABS: 1.0000 | ORAL_TABLET | Freq: Four times a day (QID) | ORAL | Status: DC | PRN

## 2012-08-27 MED ORDER — LORAZEPAM 0.5 MG PO TABS: 0.5000 mg | ORAL_TABLET | Freq: Every day | ORAL | Status: DC | PRN

## 2012-08-27 NOTE — Telephone Encounter (Signed)
Hydrocodone refilled. No more without visit

## 2012-08-27 NOTE — Telephone Encounter (Signed)
[facility] pharmacy called and needed clarification on patient name and date of birth.  This was given.

## 2012-08-27 NOTE — Telephone Encounter (Signed)
Anxiety medication refilled was lorazepam. That and hydrocodone called to pharmacy. Notified [name] and told her no more refills without appointment.

## 2012-08-27 NOTE — Telephone Encounter (Signed)
Bracken hydrocodone was last filled by you on 05/09/12 # 60.  Dr Karleen Hampshire gave him #10 when he had his surgery on 08/20/12. You addressed the xanax but didn't address the hydrocodone request of his ?headache medicine? In the message. Do you want to refill the hydrocodone and any headache medicine (it may be the hydrocodone she was talking about)?

## 2012-09-02 ENCOUNTER — Encounter: Payer: Medicaid Other | Attending: Physical Medicine & Rehabilitation | Admitting: Physical Medicine & Rehabilitation

## 2012-09-02 DIAGNOSIS — S069X9A Unspecified intracranial injury with loss of consciousness of unspecified duration, initial encounter: Secondary | ICD-10-CM | POA: Insufficient documentation

## 2012-09-02 DIAGNOSIS — Z5181 Encounter for therapeutic drug level monitoring: Secondary | ICD-10-CM | POA: Insufficient documentation

## 2012-09-02 DIAGNOSIS — S069XAA Unspecified intracranial injury with loss of consciousness status unknown, initial encounter: Secondary | ICD-10-CM | POA: Insufficient documentation

## 2012-09-02 DIAGNOSIS — X58XXXA Exposure to other specified factors, initial encounter: Secondary | ICD-10-CM | POA: Insufficient documentation

## 2012-11-27 ENCOUNTER — Other Ambulatory Visit: Payer: Self-pay | Admitting: *Deleted

## 2012-11-27 DIAGNOSIS — I70219 Atherosclerosis of native arteries of extremities with intermittent claudication, unspecified extremity: Secondary | ICD-10-CM

## 2013-01-06 ENCOUNTER — Encounter: Payer: Self-pay | Admitting: Vascular Surgery

## 2013-01-07 ENCOUNTER — Encounter (INDEPENDENT_AMBULATORY_CARE_PROVIDER_SITE_OTHER): Payer: Medicaid Other | Admitting: *Deleted

## 2013-01-07 ENCOUNTER — Ambulatory Visit (INDEPENDENT_AMBULATORY_CARE_PROVIDER_SITE_OTHER): Payer: Medicaid Other | Admitting: Vascular Surgery

## 2013-01-07 ENCOUNTER — Encounter: Payer: Self-pay | Admitting: Vascular Surgery

## 2013-01-07 VITALS — BP 161/84 | HR 65 | Ht 69.0 in | Wt 155.5 lb

## 2013-01-07 DIAGNOSIS — M79609 Pain in unspecified limb: Secondary | ICD-10-CM

## 2013-01-07 DIAGNOSIS — I739 Peripheral vascular disease, unspecified: Secondary | ICD-10-CM

## 2013-01-07 DIAGNOSIS — I70219 Atherosclerosis of native arteries of extremities with intermittent claudication, unspecified extremity: Secondary | ICD-10-CM
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
## 2013-03-01 ENCOUNTER — Emergency Department (HOSPITAL_COMMUNITY): Payer: Medicaid Other

## 2013-03-01 ENCOUNTER — Encounter (HOSPITAL_COMMUNITY): Payer: Self-pay | Admitting: *Deleted

## 2013-03-01 ENCOUNTER — Emergency Department (HOSPITAL_COMMUNITY)
Admission: EM | Admit: 2013-03-01 | Discharge: 2013-03-01 | Disposition: A | Discharge status: Home / Self Care | Payer: Medicaid Other | Attending: Emergency Medicine | Admitting: Emergency Medicine

## 2013-03-01 DIAGNOSIS — Z87820 Personal history of traumatic brain injury: Secondary | ICD-10-CM | POA: Insufficient documentation

## 2013-03-01 DIAGNOSIS — Z79899 Other long term (current) drug therapy: Secondary | ICD-10-CM | POA: Insufficient documentation

## 2013-03-01 DIAGNOSIS — F3289 Other specified depressive episodes: Secondary | ICD-10-CM | POA: Insufficient documentation

## 2013-03-01 DIAGNOSIS — F329 Major depressive disorder, single episode, unspecified: Secondary | ICD-10-CM | POA: Insufficient documentation

## 2013-03-01 DIAGNOSIS — J45909 Unspecified asthma, uncomplicated: Secondary | ICD-10-CM | POA: Insufficient documentation

## 2013-03-01 DIAGNOSIS — I10 Essential (primary) hypertension: Secondary | ICD-10-CM | POA: Insufficient documentation

## 2013-03-01 DIAGNOSIS — Z8619 Personal history of other infectious and parasitic diseases: Secondary | ICD-10-CM | POA: Insufficient documentation

## 2013-03-01 DIAGNOSIS — Z8781 Personal history of (healed) traumatic fracture: Secondary | ICD-10-CM | POA: Insufficient documentation

## 2013-03-01 DIAGNOSIS — Z8719 Personal history of other diseases of the digestive system: Secondary | ICD-10-CM | POA: Insufficient documentation

## 2013-03-01 DIAGNOSIS — Z8669 Personal history of other diseases of the nervous system and sense organs: Secondary | ICD-10-CM | POA: Insufficient documentation

## 2013-03-01 DIAGNOSIS — G40909 Epilepsy, unspecified, not intractable, without status epilepticus: Secondary | ICD-10-CM | POA: Insufficient documentation

## 2013-03-01 DIAGNOSIS — F17200 Nicotine dependence, unspecified, uncomplicated: Secondary | ICD-10-CM | POA: Insufficient documentation

## 2013-03-01 DIAGNOSIS — R569 Unspecified convulsions: Secondary | ICD-10-CM

## 2013-03-01 DIAGNOSIS — Z862 Personal history of diseases of the blood and blood-forming organs and certain disorders involving the immune mechanism: Secondary | ICD-10-CM | POA: Insufficient documentation

## 2013-03-01 LAB — BASIC METABOLIC PANEL
Calcium: 9 mg/dL (ref 8.4–10.5)
Creatinine, Ser: 0.79 mg/dL (ref 0.50–1.35)
GFR calc Af Amer: >90 mL/min (ref >90)
GFR calc non Af Amer: >90 mL/min (ref >90)

## 2013-03-01 LAB — HEPATIC FUNCTION PANEL
AST: 36 U/L (ref 0–37)
Albumin: 3.5 g/dL (ref 3.5–5.2)
Bilirubin, Direct: 0.1 mg/dL (ref 0.0–0.3)
Total Protein: 6.1 g/dL (ref 6.0–8.3)

## 2013-03-01 LAB — CBC
MCV: 87.7 fL (ref 78.0–100.0)
Platelets: 175 10*3/uL (ref 150–400)
RDW: 13.1 % (ref 11.5–15.5)
WBC: 5 10*3/uL (ref 4.0–10.5)

## 2013-03-01 MED ORDER — SODIUM CHLORIDE 0.9 % IV SOLN
1000.0000 mg | Freq: Once | INTRAVENOUS | Status: AC
  Administered 2013-03-01: 1000 mg via INTRAVENOUS
  Filled 2013-03-01: qty 10
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
## 2013-03-01 NOTE — ED Notes (Signed)
The pt arrived by gems from home where the pt had a generalized seizure just pta.  Post ictal initially now talking but confused.  On seizure meds one  Year ago for a head injury but no seizure reported until tonight

## 2013-03-01 NOTE — ED Notes (Signed)
The pt is alert skin warm and dry. No tongue damage no incontinence of bowel or bladder

## 2013-03-01 NOTE — ED Notes (Signed)
Family at bedside. 
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
## 2013-04-12 IMAGING — CR DG FEMUR 2V*L*
4 series · 4 of 4 positions shown · non-contrast
Comparison: 02/21/2012.

CLINICAL DATA: 60-year-old male status post ORIF left femur
fracture.

LEFT FEMUR - 2 VIEW

[t femur proximal ap left (1 of 2)]
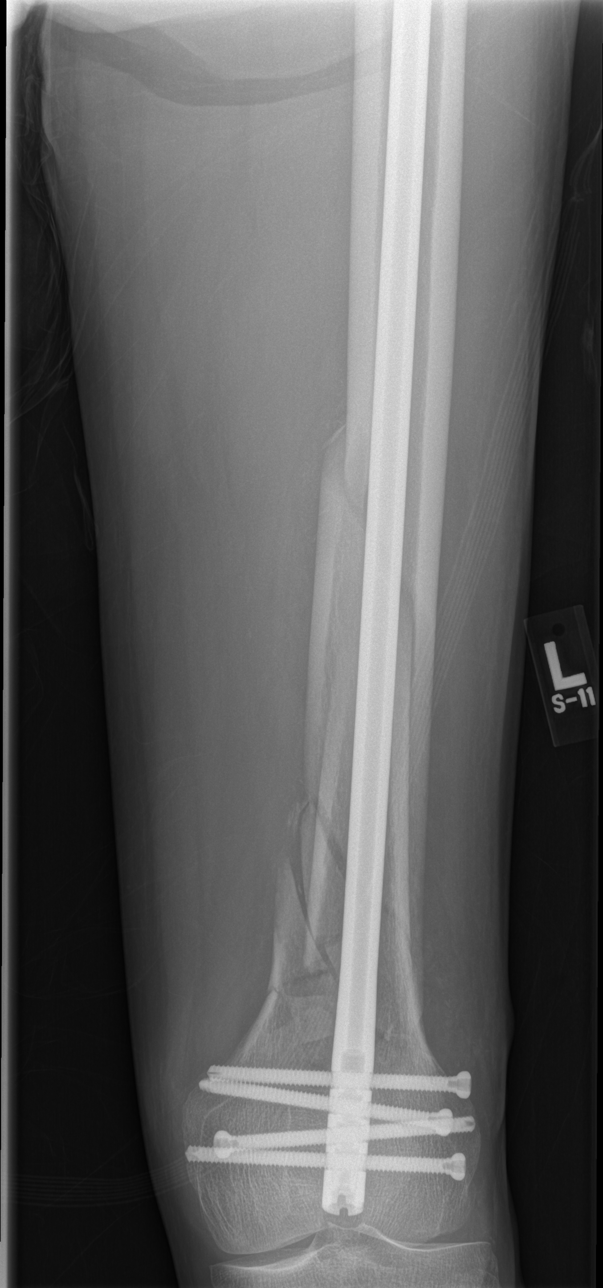

[t femur proximal ap left (2 of 2)]
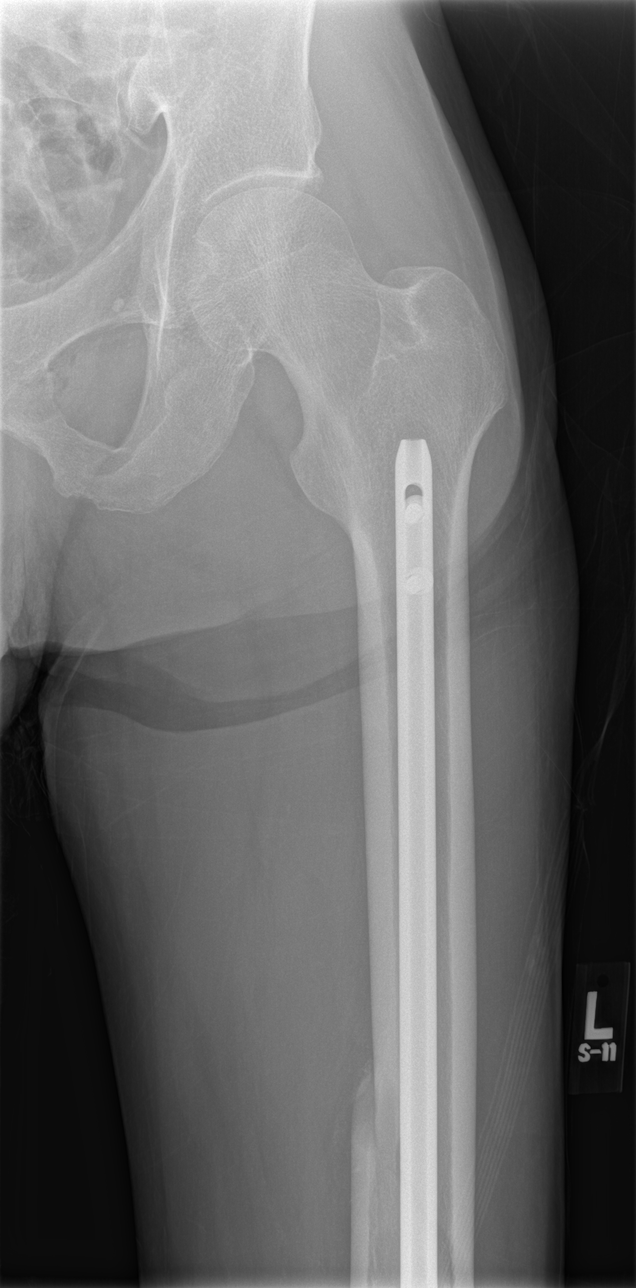

[x femur distal lat left (1 of 2)]
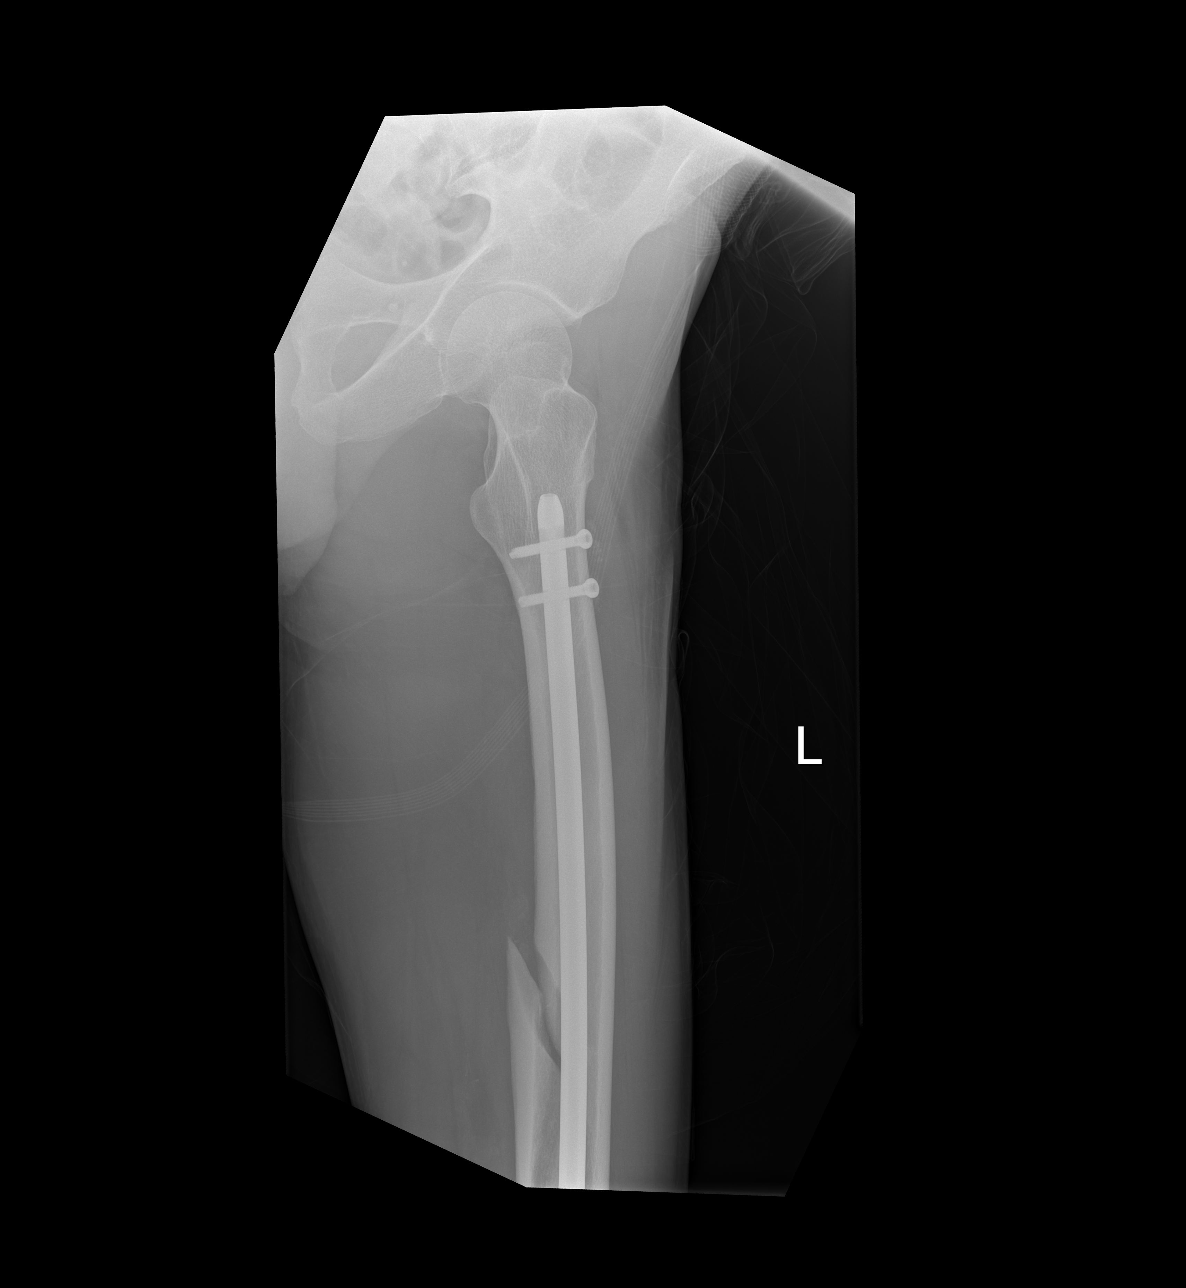

[x femur distal lat left (2 of 2)]
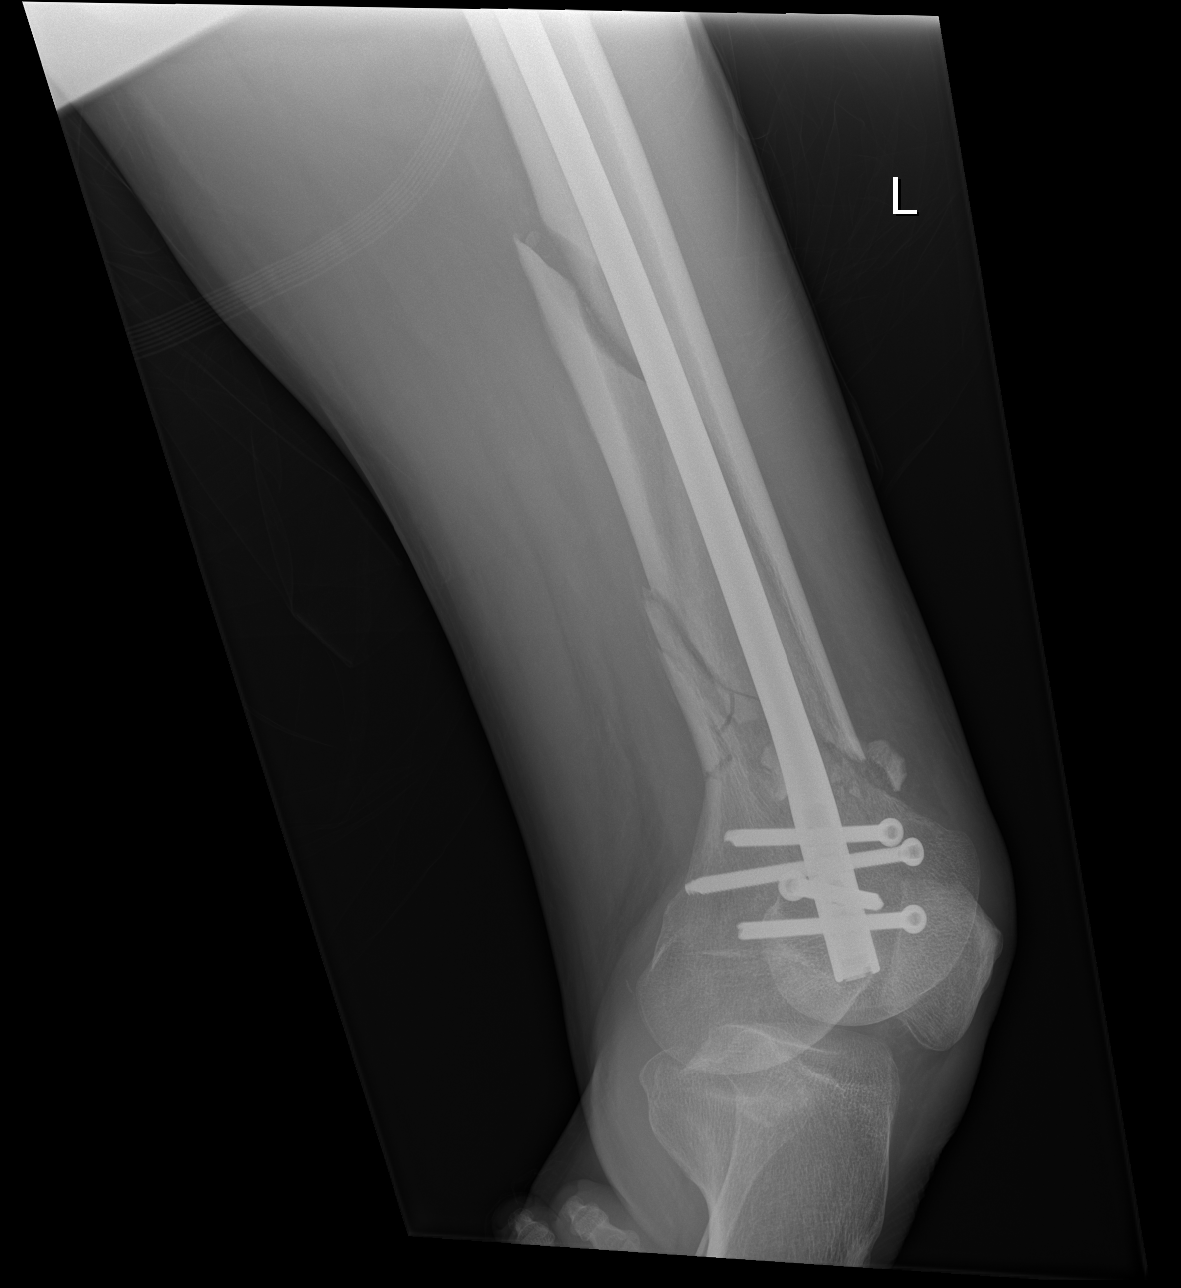

[4 of 4 positions shown; findings below may reference images not displayed]

FINDINGS: Left femur intramedullary rod with proximal and distal
interlocking hardware re-identified traversing the severely
comminuted distal left femur metadiaphysis fracture.  Hardware
appears stable and intact.  Alignment of fracture fragment is
stable.  Resolved suprapatellar gas.  No periosteal reaction
identified.  Left femoral head remains normally located.
IMPRESSION: Stable postoperative appearance of the left femur.

## 2013-04-28 ENCOUNTER — Other Ambulatory Visit: Payer: Self-pay | Admitting: Internal Medicine

## 2013-04-28 DIAGNOSIS — C220 Liver cell carcinoma: Secondary | ICD-10-CM

## 2013-04-30 ENCOUNTER — Ambulatory Visit
Admission: RE | Admit: 2013-04-30 | Discharge: 2013-04-30 | Disposition: A | Discharge status: Home / Self Care | Payer: Medicaid Other | Source: Ambulatory Visit | Attending: Internal Medicine | Admitting: Internal Medicine

## 2013-04-30 DIAGNOSIS — C220 Liver cell carcinoma: Secondary | ICD-10-CM

## 2013-06-11 DIAGNOSIS — Z0279 Encounter for issue of other medical certificate: Secondary | ICD-10-CM

## 2013-06-16 ENCOUNTER — Other Ambulatory Visit: Payer: Self-pay | Admitting: Internal Medicine

## 2013-06-16 DIAGNOSIS — C220 Liver cell carcinoma: Secondary | ICD-10-CM

## 2013-06-30 ENCOUNTER — Other Ambulatory Visit: Payer: Medicaid Other

## 2013-07-06 ENCOUNTER — Ambulatory Visit
Admission: RE | Admit: 2013-07-06 | Discharge: 2013-07-06 | Disposition: A | Discharge status: Home / Self Care | Payer: Medicaid Other | Source: Ambulatory Visit | Attending: Internal Medicine | Admitting: Internal Medicine

## 2013-07-06 DIAGNOSIS — C220 Liver cell carcinoma: Secondary | ICD-10-CM

## 2013-07-06 MED ORDER — IOHEXOL 350 MG/ML SOLN
100.0000 mL | Freq: Once | INTRAVENOUS | Status: AC | PRN
  Administered 2013-07-06: 100 mL via INTRAVENOUS

## 2013-07-31 ENCOUNTER — Ambulatory Visit (INDEPENDENT_AMBULATORY_CARE_PROVIDER_SITE_OTHER): Payer: Medicaid Other | Admitting: Family Medicine

## 2013-07-31 ENCOUNTER — Encounter: Payer: Self-pay | Admitting: Family Medicine

## 2013-07-31 VITALS — BP 145/87 | HR 78 | Temp 97.8°F | Ht 69.0 in | Wt 170.2 lb

## 2013-07-31 DIAGNOSIS — B1920 Unspecified viral hepatitis C without hepatic coma: Secondary | ICD-10-CM

## 2013-07-31 DIAGNOSIS — I70219 Atherosclerosis of native arteries of extremities with intermittent claudication, unspecified extremity: Secondary | ICD-10-CM

## 2013-07-31 DIAGNOSIS — S069XAA Unspecified intracranial injury with loss of consciousness status unknown, initial encounter: Secondary | ICD-10-CM

## 2013-07-31 DIAGNOSIS — F17200 Nicotine dependence, unspecified, uncomplicated: Secondary | ICD-10-CM

## 2013-07-31 DIAGNOSIS — S069X9A Unspecified intracranial injury with loss of consciousness of unspecified duration, initial encounter: Secondary | ICD-10-CM

## 2013-07-31 DIAGNOSIS — Z720 Tobacco use: Secondary | ICD-10-CM

## 2013-07-31 DIAGNOSIS — R911 Solitary pulmonary nodule: Secondary | ICD-10-CM

## 2013-07-31 LAB — CBC WITH DIFFERENTIAL/PLATELET
BASOS PCT: 0 % (ref 0–1)
Basophils Absolute: 0 10*3/uL (ref 0.0–0.1)
EOS PCT: 2 % (ref 0–5)
Eosinophils Absolute: 0.1 10*3/uL (ref 0.0–0.7)
HEMATOCRIT: 46.2 % (ref 39.0–52.0)
HEMOGLOBIN: 16.2 g/dL (ref 13.0–17.0)
Lymphocytes Relative: 40 % (ref 12–46)
Lymphs Abs: 2.1 10*3/uL (ref 0.7–4.0)
MCH: 31.5 pg (ref 26.0–34.0)
MCHC: 35.1 g/dL (ref 30.0–36.0)
MCV: 89.7 fL (ref 78.0–100.0)
MONO ABS: 0.8 10*3/uL (ref 0.1–1.0)
MONOS PCT: 15 % — AB (ref 3–12)
NEUTROS ABS: 2.3 10*3/uL (ref 1.7–7.7)
Neutrophils Relative %: 43 % (ref 43–77)
Platelets: 195 10*3/uL (ref 150–400)
RBC: 5.15 MIL/uL (ref 4.22–5.81)
RDW: 13.4 % (ref 11.5–15.5)
WBC: 5.2 10*3/uL (ref 4.0–10.5)

## 2013-07-31 LAB — COMPLETE METABOLIC PANEL WITH GFR
ALBUMIN: 4.5 g/dL (ref 3.5–5.2)
ALK PHOS: 108 U/L (ref 39–117)
ALT: 53 U/L (ref 0–53)
AST: 53 U/L — ABNORMAL HIGH (ref 0–37)
BUN: 10 mg/dL (ref 6–23)
CALCIUM: 9.7 mg/dL (ref 8.4–10.5)
CO2: 27 mEq/L (ref 19–32)
CREATININE: 0.77 mg/dL (ref 0.50–1.35)
Chloride: 105 mEq/L (ref 96–112)
GFR, Est African American: >89 mL/min
GLUCOSE: 87 mg/dL (ref 70–99)
POTASSIUM: 4 meq/L (ref 3.5–5.3)
Sodium: 140 mEq/L (ref 135–145)
Total Bilirubin: 0.9 mg/dL (ref 0.2–1.2)
Total Protein: 7.2 g/dL (ref 6.0–8.3)

## 2013-07-31 LAB — LDL CHOLESTEROL, DIRECT: LDL DIRECT: 100 mg/dL — AB

## 2013-07-31 LAB — POCT GLYCOSYLATED HEMOGLOBIN (HGB A1C): Hemoglobin A1C: 5.7
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
## 2013-07-31 NOTE — Patient Instructions (Signed)
Thank you for coming in today!  We are checking some labs today, and I will call you if they are abnormal. If you do not hear from me by phone or letter in 2 weeks, please call us as I may have been unable to reach you.   As you leave, make an appointment to follow up with me in 1 month.  - Bring all medications in a bag to your visits. - Bring a list of questions. - Take all medications as directed - Follow up with the liver doctor and with Dr. Naaman Plummer  Take care and seek immediate care sooner if you develop any concerns.  Please feel free to call with any questions or concerns at any time, at [phone]. - Dr. Bonner Puna

## 2013-08-07 DIAGNOSIS — R911 Solitary pulmonary nodule: Secondary | ICD-10-CM | POA: Insufficient documentation

## 2013-08-07 DIAGNOSIS — F17200 Nicotine dependence, unspecified, uncomplicated: Secondary | ICD-10-CM | POA: Insufficient documentation

## 2013-08-07 DIAGNOSIS — Z720 Tobacco use: Secondary | ICD-10-CM | POA: Insufficient documentation

## 2013-08-07 DIAGNOSIS — B1920 Unspecified viral hepatitis C without hepatic coma: Secondary | ICD-10-CM | POA: Insufficient documentation

## 2013-08-07 HISTORY — DX: Unspecified viral hepatitis C without hepatic coma: B19.20

## 2013-08-07 NOTE — Assessment & Plan Note (Signed)
Pre-contemplative phase. 10 pack year history as of 2015. Advised him to stop smoking and let him know about medical management of tobacco cessation.

## 2013-08-07 NOTE — Assessment & Plan Note (Signed)
Seen at liver clinic. No unintentional weight loss or fatigue. CMP today.

## 2013-08-07 NOTE — Assessment & Plan Note (Signed)
Will have dedicated CT chest at 3-6 months to re-evaluate, given risk for bronchogenic carcinoma due to ~10 pack-year history.

## 2013-08-07 NOTE — Assessment & Plan Note (Signed)
Continues to have neurological sequelae. Seizures controlled on anti-epileptics. CT head 03/11/2013 showed encephalomalacia of right fronto-temporal and parietal regions.

## 2013-08-28 ENCOUNTER — Ambulatory Visit (INDEPENDENT_AMBULATORY_CARE_PROVIDER_SITE_OTHER): Payer: Medicaid Other | Admitting: Family Medicine

## 2013-08-28 ENCOUNTER — Encounter: Payer: Self-pay | Admitting: Family Medicine

## 2013-08-28 VITALS — BP 122/88 | HR 82 | Temp 99.1°F | Ht 69.0 in | Wt 170.0 lb

## 2013-08-28 DIAGNOSIS — K5900 Constipation, unspecified: Secondary | ICD-10-CM

## 2013-08-28 DIAGNOSIS — B1920 Unspecified viral hepatitis C without hepatic coma: Secondary | ICD-10-CM

## 2013-08-28 DIAGNOSIS — E669 Obesity, unspecified: Secondary | ICD-10-CM | POA: Insufficient documentation

## 2013-08-28 DIAGNOSIS — R911 Solitary pulmonary nodule: Secondary | ICD-10-CM

## 2013-08-28 DIAGNOSIS — E663 Overweight: Secondary | ICD-10-CM

## 2013-08-28 DIAGNOSIS — Z720 Tobacco use: Secondary | ICD-10-CM

## 2013-08-28 DIAGNOSIS — S069X9A Unspecified intracranial injury with loss of consciousness of unspecified duration, initial encounter: Secondary | ICD-10-CM

## 2013-08-28 DIAGNOSIS — F17200 Nicotine dependence, unspecified, uncomplicated: Secondary | ICD-10-CM

## 2013-08-28 DIAGNOSIS — S069XAA Unspecified intracranial injury with loss of consciousness status unknown, initial encounter: Secondary | ICD-10-CM

## 2013-08-28 MED ORDER — POLYETHYLENE GLYCOL 3350 17 GM/SCOOP PO POWD: 17.0000 g | Freq: Every day | ORAL | Status: DC

## 2013-08-28 NOTE — Patient Instructions (Signed)
Make a follow up appointment in 3 months.  Please bring a list of questions to your next visit. Take miralax every morning.  Keep thinking about stopping smoking.  You will need a colonoscopy and a chest CT scan in 3 months.
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
## 2013-08-28 NOTE — Assessment & Plan Note (Signed)
Contemplative phase. Approximately 50 pack year history (not 10 as previously documented). Urged to continue considering setting a quit date and offered assistance with quitting. Quit phone line provided.

## 2013-08-28 NOTE — Assessment & Plan Note (Signed)
Recommended lifestyle modifications for weight loss. Advised that even small incremental improvements in activity level and/or diet would improve his overall health.

## 2013-08-28 NOTE — Assessment & Plan Note (Signed)
-   Rx miralax, encouraged balance, healthy diet and hydration. Pt to continue stool softener daily. Last colonoscopy in 2010 showed 2 polyps which were snared and mild diverticulosis. 5 year follow up was recommended.  - Needs screening colonoscopy 12/22/2013

## 2013-08-28 NOTE — Assessment & Plan Note (Signed)
Will schedule chest CT between April-July 2015. No new cough, shortness of breath, night sweats or weight loss.

## 2013-08-28 NOTE — Assessment & Plan Note (Signed)
Slightly elevated ALT on CMP, but normal hepatic function otherwise. Continues to follow at liver clinic.

## 2013-10-06 ENCOUNTER — Ambulatory Visit: Payer: Medicaid Other | Admitting: Family Medicine

## 2013-10-09 ENCOUNTER — Ambulatory Visit (INDEPENDENT_AMBULATORY_CARE_PROVIDER_SITE_OTHER): Payer: Medicaid Other | Admitting: Family Medicine

## 2013-10-09 ENCOUNTER — Encounter: Payer: Self-pay | Admitting: Family Medicine

## 2013-10-09 VITALS — BP 120/74 | HR 78 | Temp 98.8°F | Ht 69.0 in | Wt 177.0 lb

## 2013-10-09 DIAGNOSIS — S069X9A Unspecified intracranial injury with loss of consciousness of unspecified duration, initial encounter: Secondary | ICD-10-CM

## 2013-10-09 DIAGNOSIS — S069XAA Unspecified intracranial injury with loss of consciousness status unknown, initial encounter: Secondary | ICD-10-CM

## 2013-10-09 MED ORDER — LORAZEPAM 0.5 MG PO TABS: 0.5000 mg | ORAL_TABLET | Freq: Two times a day (BID) | ORAL | Status: DC | PRN

## 2013-10-10 NOTE — Assessment & Plan Note (Signed)
Pt on carbamazepine. Ativan in our system is listed PRN anxiety, no for sizure. Pt has not had seizure activity. Last dose of ativan was 3 days ago.  Plan Discussed with pt and wife indication of this short acting benzo, and its indication on pt. Wife is to call his prior doctor and clarify this. Prescription given to take BID PRN anxiety and f/u with PCP.
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
## 2013-10-13 ENCOUNTER — Encounter: Payer: Self-pay | Admitting: Internal Medicine

## 2013-10-23 ENCOUNTER — Telehealth: Payer: Self-pay | Admitting: *Deleted

## 2013-10-23 NOTE — Telephone Encounter (Signed)
I have called and spoken with Stephen Buckley who is unable to recall any medications he is on nor their indications. I spoke with his wife who has been his caretaker and giving him his medications. She reports good adherence to the medications and expresses competence when asked about their indications and schedules. She wrote down and was able to verify back to me the taper as directed. He will begin the taper tomorrow, 5/2. They will also call on Monday morning to schedule an appointment with me.

## 2013-10-23 NOTE — Telephone Encounter (Signed)
Stephen Locks, NP from [facility] called and would like to speak with PCP regarding pt medication carbamazepine. She wants to start pt on Hep C treatment, but the carbamazepine interacts with all Hep C treatment.  Please give her a call at the office [phone] or cell [phone]. Derl Barrow, RN
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
## 2013-10-30 ENCOUNTER — Ambulatory Visit (INDEPENDENT_AMBULATORY_CARE_PROVIDER_SITE_OTHER): Payer: Medicaid Other | Admitting: Family Medicine

## 2013-10-30 ENCOUNTER — Encounter: Payer: Self-pay | Admitting: Family Medicine

## 2013-10-30 VITALS — BP 128/81 | HR 78 | Temp 98.6°F | Ht 69.0 in | Wt 174.0 lb

## 2013-10-30 DIAGNOSIS — Z9119 Patient's noncompliance with other medical treatment and regimen: Secondary | ICD-10-CM

## 2013-10-30 DIAGNOSIS — Z91199 Patient's noncompliance with other medical treatment and regimen due to unspecified reason: Secondary | ICD-10-CM

## 2013-10-30 DIAGNOSIS — K5900 Constipation, unspecified: Secondary | ICD-10-CM

## 2013-10-30 DIAGNOSIS — B1920 Unspecified viral hepatitis C without hepatic coma: Secondary | ICD-10-CM

## 2013-10-30 NOTE — Progress Notes (Signed)
Patient ID: Stephen Buckley, male   DOB: 10-Dec-1950, 63 y.o.   MRN: [id number]   Subjective:  HPI:   Stephen Buckley is a 63 y.o. male with a history of TBI and medical nonadherence here for constipation.  He was recently started on 2 week taper of carbamazepine to allow for treatment of hepatitis C. His wife has ensured that he takes his medications but requests return of home health services for this as he frequently does not take other medications as prescribed. He has had improved medical adherence since his wife placed his medication pill keeper near the TV.   He brings a list of concerns that includes constipation, increased urination, gas, anxiety, concern for worsening amnesia. The most trouble concern is constipation. He experiences stomach fullness and gas around this issue, but has refused to take the miralax prescribed for this last time. He cannot vocalize a response to explain his reservations, questions, or concerns with this medication.   Review of Systems:  Per HPI. All other systems reviewed and are negative.    Past Medical History:  Medications: reviewed and updated Current Outpatient Prescriptions  Medication Sig Dispense Refill  . carbamazepine (EPITOL) 200 MG tablet Take 200 mg by mouth 2 (two) times daily.      Marland Kitchen escitalopram (LEXAPRO) 10 MG tablet Take 10 mg by mouth daily.      Marland Kitchen etodolac (LODINE) 200 MG capsule Take 200 mg by mouth daily as needed (pain).      . LORazepam (ATIVAN) 0.5 MG tablet Take 1 tablet (0.5 mg total) by mouth 2 (two) times daily as needed for anxiety.  30 tablet  0  . polyethylene glycol powder (GLYCOLAX/MIRALAX) powder Take 17 g by mouth daily.  3350 g  5  . traZODone (DESYREL) 100 MG tablet Take 100 mg by mouth at bedtime as needed for sleep.       Objective:  Physical Exam: BP 128/81  Pulse 78  Temp(Src) 98.6 F (37 C) (Oral)  Ht 5\' 9"  (1.753 m)  Wt 174 lb (78.926 kg)  BMI 25.68 kg/m2  Gen: Overweight 63 y.o. male in  NAD HEENT: MMM, EOMI, PERRL, anicteric sclerae CV: RRR, no MRG, no JVD Resp: Non-labored, CTAB, no wheezes noted Abd: Soft, NTND, BS present, no guarding or organomegaly; no palpable stool burden or distention.  MSK: No edema noted, full ROM Neuro: Alert and oriented, visual fields full to confrontation, antalgic gait, speech grossly delayed    Assessment:     Stephen Buckley is a 63 y.o. male here for constipation.    Plan:     See problem list for problem-specific plans.

## 2013-10-30 NOTE — Patient Instructions (Signed)
Thank you for coming in today!  I ordered your home health aide.  You must take your medications. Take the miralax and if the constipation and gas don't improve in the next month, schedule a follow up appointment.   Take care and seek immediate care sooner if you develop any concerns.  Please feel free to call with any questions or concerns at any time, at [phone]. - Dr. Bonner Puna

## 2013-10-31 NOTE — Assessment & Plan Note (Signed)
-   Pt still with this and gas, has not taken anything we talked about taking last visit. Reinforced medical adherence as the only way to help himself. Reinforced this with his wife as well.

## 2013-10-31 NOTE — Assessment & Plan Note (Signed)
Will start tx soon. Following taper of carbamazepine as directed. Will be done in 8 days.

## 2013-11-06 ENCOUNTER — Telehealth: Payer: Self-pay | Admitting: Clinical

## 2013-11-06 ENCOUNTER — Other Ambulatory Visit: Payer: Self-pay | Admitting: Family Medicine

## 2013-11-06 DIAGNOSIS — Z9119 Patient's noncompliance with other medical treatment and regimen: Secondary | ICD-10-CM

## 2013-11-06 DIAGNOSIS — Z91199 Patient's noncompliance with other medical treatment and regimen due to unspecified reason: Secondary | ICD-10-CM

## 2013-11-06 NOTE — Telephone Encounter (Signed)
CSW sent orders for [facility] and aide to [facility]. They will follow up with pt.  Hunt Oris, MSW, [place]

## 2013-11-10 ENCOUNTER — Encounter: Payer: Self-pay | Admitting: Internal Medicine

## 2013-11-27 ENCOUNTER — Encounter: Payer: Self-pay | Admitting: Family Medicine

## 2013-11-27 ENCOUNTER — Ambulatory Visit (INDEPENDENT_AMBULATORY_CARE_PROVIDER_SITE_OTHER): Payer: Medicaid Other | Admitting: Family Medicine

## 2013-11-27 VITALS — BP 133/85 | HR 78 | Temp 98.3°F | Wt 171.0 lb

## 2013-11-27 DIAGNOSIS — L299 Pruritus, unspecified: Secondary | ICD-10-CM | POA: Insufficient documentation

## 2013-11-27 DIAGNOSIS — N529 Male erectile dysfunction, unspecified: Secondary | ICD-10-CM | POA: Insufficient documentation

## 2013-11-27 MED ORDER — SILDENAFIL CITRATE 50 MG PO TABS: 50.0000 mg | ORAL_TABLET | Freq: Every day | ORAL | Status: DC | PRN

## 2013-11-27 MED ORDER — HYDROXYZINE HCL 10 MG PO TABS: 10.0000 mg | ORAL_TABLET | Freq: Three times a day (TID) | ORAL | Status: DC | PRN

## 2013-11-27 NOTE — Patient Instructions (Signed)
It was good to see you today  - Take miralax once a day EVERY DAY until you have a normal regular bowel movement per day.  - Take hydroxyzine at night as needed for itching - you can take it during the day too but it might make you drowsy. - Let me know how it goes with viagra.   Follow up in 1 month to discuss how it's going.   Good to see you as always! - Vance Gather, MD

## 2013-11-27 NOTE — Assessment & Plan Note (Signed)
,   Generalized. Will check CMP. No signs of jaundice. Previous LFTs only mildly elevated. Rx hydroxyzine prn itching especially at night. Cautioned against sedation during daytime use.
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
## 2013-11-27 NOTE — Assessment & Plan Note (Signed)
From an apparently organic cause, possibly late manifestation of TBI. Will trial viagra, follow up in 1 week. Precautions reviewed.

## 2013-11-28 LAB — COMPREHENSIVE METABOLIC PANEL
ALK PHOS: 116 U/L (ref 39–117)
ALT: 35 U/L (ref 0–53)
AST: 29 U/L (ref 0–37)
Albumin: 4.4 g/dL (ref 3.5–5.2)
BUN: 10 mg/dL (ref 6–23)
CO2: 21 mEq/L (ref 19–32)
CREATININE: 0.76 mg/dL (ref 0.50–1.35)
Calcium: 9.7 mg/dL (ref 8.4–10.5)
Chloride: 107 mEq/L (ref 96–112)
Glucose, Bld: 91 mg/dL (ref 70–99)
POTASSIUM: 4 meq/L (ref 3.5–5.3)
Sodium: 139 mEq/L (ref 135–145)
Total Bilirubin: 1.6 mg/dL — ABNORMAL HIGH (ref 0.2–1.2)
Total Protein: 7.4 g/dL (ref 6.0–8.3)

## 2013-12-04 ENCOUNTER — Telehealth: Payer: Self-pay | Admitting: Family Medicine

## 2013-12-04 DIAGNOSIS — N539 Unspecified male sexual dysfunction: Secondary | ICD-10-CM

## 2013-12-04 DIAGNOSIS — R6882 Decreased libido: Secondary | ICD-10-CM

## 2013-12-04 NOTE — Telephone Encounter (Signed)
Pt called because the Viagra is not covered by his insurance and he would like something else called that would be covered. jw

## 2013-12-08 NOTE — Telephone Encounter (Signed)
I called and discussed with him that medicaid will not cover any of this class of medications. He would like to move forward with assessing his testosterone status, so I will order this future lab and he will call to schedule this in the early morning and schedule a follow up with me to discuss the results.

## 2013-12-16 ENCOUNTER — Other Ambulatory Visit: Payer: Medicaid Other

## 2013-12-16 LAB — TESTOSTERONE: Testosterone: 885 ng/dL (ref 300–890)

## 2013-12-16 NOTE — Progress Notes (Signed)
TESTOSTERONE DONE TODAY [place]

## 2013-12-16 NOTE — Addendum Note (Signed)
Addended by: Lianne Bushy on: 12/16/2013 08:43 AM   Modules accepted: Orders

## 2014-01-26 ENCOUNTER — Other Ambulatory Visit: Payer: Self-pay | Admitting: Nurse Practitioner

## 2014-01-26 DIAGNOSIS — C220 Liver cell carcinoma: Secondary | ICD-10-CM

## 2014-02-05 ENCOUNTER — Ambulatory Visit (INDEPENDENT_AMBULATORY_CARE_PROVIDER_SITE_OTHER): Payer: Medicaid Other | Admitting: Family Medicine

## 2014-02-05 ENCOUNTER — Other Ambulatory Visit: Payer: Self-pay | Admitting: Nurse Practitioner

## 2014-02-05 ENCOUNTER — Encounter: Payer: Self-pay | Admitting: Family Medicine

## 2014-02-05 VITALS — BP 117/79 | HR 93 | Temp 98.0°F | Ht 69.0 in | Wt 177.0 lb

## 2014-02-05 DIAGNOSIS — F6381 Intermittent explosive disorder: Secondary | ICD-10-CM

## 2014-02-05 DIAGNOSIS — N529 Male erectile dysfunction, unspecified: Secondary | ICD-10-CM

## 2014-02-05 DIAGNOSIS — S069XAS Unspecified intracranial injury with loss of consciousness status unknown, sequela: Secondary | ICD-10-CM

## 2014-02-05 DIAGNOSIS — L299 Pruritus, unspecified: Secondary | ICD-10-CM

## 2014-02-05 DIAGNOSIS — S069X9S Unspecified intracranial injury with loss of consciousness of unspecified duration, sequela: Secondary | ICD-10-CM

## 2014-02-05 DIAGNOSIS — Z139 Encounter for screening, unspecified: Secondary | ICD-10-CM

## 2014-02-05 DIAGNOSIS — S069X7S Unspecified intracranial injury with loss of consciousness of any duration with death due to brain injury prior to regaining consciousness, sequela: Secondary | ICD-10-CM

## 2014-02-05 MED ORDER — LORAZEPAM 0.5 MG PO TABS: 0.5000 mg | ORAL_TABLET | Freq: Three times a day (TID) | ORAL | Status: DC | PRN

## 2014-02-05 MED ORDER — DIVALPROEX SODIUM 250 MG PO DR TAB: 250.0000 mg | DELAYED_RELEASE_TABLET | Freq: Two times a day (BID) | ORAL | Status: DC

## 2014-02-05 MED ORDER — HYDROXYZINE HCL 10 MG PO TABS: 10.0000 mg | ORAL_TABLET | Freq: Three times a day (TID) | ORAL | Status: DC | PRN

## 2014-02-05 NOTE — Patient Instructions (Signed)
I have refilled the hydroxyzine for itching, this should also help with behavior.  I have also refilled ativan.  - Start taking depakote (valpro) twice a day for behavior. This will be the substitute for carbamazepine while you are undergoing treatment for Hep C.  - Follow up in 2 months, or call [phone] to let me know how it's going sooner.   Take care! - Vance Gather, MD

## 2014-02-05 NOTE — Assessment & Plan Note (Signed)
Symptoms returning upon discontinuation of carbamazepine. Will try to replace with depakote until Hep C treatment finishes. (At least 4 more weeks, possible 4 more months). Encouraged continued use of ativan prn or scheduled if need-be for the time before returning to carbamazepine.

## 2014-02-05 NOTE — Assessment & Plan Note (Signed)
Testosterone completely adequate and unable to afford PDE inhibitors. Recommended weight loss and quitting smoking as first line.
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
## 2014-02-09 ENCOUNTER — Inpatient Hospital Stay: Admission: RE | Admit: 2014-02-09 | Payer: Medicaid Other | Source: Ambulatory Visit

## 2014-02-09 ENCOUNTER — Other Ambulatory Visit: Payer: Medicaid Other

## 2014-02-09 ENCOUNTER — Ambulatory Visit
Admission: RE | Admit: 2014-02-09 | Discharge: 2014-02-09 | Disposition: A | Discharge status: Home / Self Care | Payer: Medicare Other | Source: Ambulatory Visit | Attending: Nurse Practitioner | Admitting: Nurse Practitioner

## 2014-02-09 ENCOUNTER — Telehealth: Payer: Self-pay | Admitting: Family Medicine

## 2014-02-09 DIAGNOSIS — Z139 Encounter for screening, unspecified: Secondary | ICD-10-CM

## 2014-02-09 DIAGNOSIS — F6381 Intermittent explosive disorder: Secondary | ICD-10-CM

## 2014-02-09 DIAGNOSIS — S069X1D Unspecified intracranial injury with loss of consciousness of 30 minutes or less, subsequent encounter: Secondary | ICD-10-CM

## 2014-02-09 NOTE — Telephone Encounter (Signed)
Spoke with patient's wife and she states that husband is still having the outbursts and she was able to get him to take the pills the first 3 days, but now refuses taking them. She was also wanting to know if Behavioral Health would benefit and be an option for him.

## 2014-02-09 NOTE — Telephone Encounter (Signed)
Wife called because she said the medication that has been prescribed to her husband is not controlling his anger outburst. She would like someone to call her so that she can discuss this with them. jw

## 2014-02-15 ENCOUNTER — Inpatient Hospital Stay: Admission: RE | Admit: 2014-02-15 | Payer: Medicaid Other | Source: Ambulatory Visit

## 2014-02-16 NOTE — Telephone Encounter (Signed)
Relayed message,patient wife voiced understanding.Lauren Aguayo, Lewie Loron

## 2014-04-01 ENCOUNTER — Ambulatory Visit
Admission: RE | Admit: 2014-04-01 | Discharge: 2014-04-01 | Disposition: A | Discharge status: Home / Self Care | Payer: Medicare Other | Source: Ambulatory Visit | Attending: Nurse Practitioner | Admitting: Nurse Practitioner

## 2014-04-01 ENCOUNTER — Other Ambulatory Visit: Payer: Self-pay | Admitting: Nurse Practitioner

## 2014-04-01 DIAGNOSIS — C220 Liver cell carcinoma: Secondary | ICD-10-CM

## 2014-04-01 DIAGNOSIS — Z77018 Contact with and (suspected) exposure to other hazardous metals: Secondary | ICD-10-CM

## 2014-04-01 MED ORDER — GADOXETATE DISODIUM 0.25 MMOL/ML IV SOLN
8.0000 mL | Freq: Once | INTRAVENOUS | Status: AC | PRN
  Administered 2014-04-01: 8 mL via INTRAVENOUS

## 2014-06-07 ENCOUNTER — Telehealth: Payer: Self-pay | Admitting: Family Medicine

## 2014-06-07 NOTE — Telephone Encounter (Signed)
Pt needs refill on omeprazole, pt goes to [facility]/[address].

## 2014-06-09 MED ORDER — OMEPRAZOLE 20 MG PO CPDR: 20.0000 mg | DELAYED_RELEASE_CAPSULE | Freq: Every day | ORAL | Status: DC

## 2014-06-15 ENCOUNTER — Ambulatory Visit: Payer: Medicare Other | Admitting: Family Medicine

## 2014-11-08 ENCOUNTER — Other Ambulatory Visit: Payer: Self-pay | Admitting: Nurse Practitioner

## 2014-11-08 DIAGNOSIS — K7469 Other cirrhosis of liver: Secondary | ICD-10-CM

## 2014-11-17 ENCOUNTER — Encounter: Payer: Self-pay | Admitting: Family Medicine

## 2014-11-17 ENCOUNTER — Ambulatory Visit (INDEPENDENT_AMBULATORY_CARE_PROVIDER_SITE_OTHER): Payer: Medicare Other | Admitting: Family Medicine

## 2014-11-17 VITALS — BP 130/78 | HR 86 | Temp 98.2°F | Ht 69.0 in | Wt 193.7 lb

## 2014-11-17 DIAGNOSIS — R911 Solitary pulmonary nodule: Secondary | ICD-10-CM | POA: Diagnosis not present

## 2014-11-17 DIAGNOSIS — N529 Male erectile dysfunction, unspecified: Secondary | ICD-10-CM

## 2014-11-17 DIAGNOSIS — Z720 Tobacco use: Secondary | ICD-10-CM

## 2014-11-17 DIAGNOSIS — F6381 Intermittent explosive disorder: Secondary | ICD-10-CM | POA: Diagnosis not present

## 2014-11-17 DIAGNOSIS — E785 Hyperlipidemia, unspecified: Secondary | ICD-10-CM | POA: Diagnosis not present

## 2014-11-17 DIAGNOSIS — B182 Chronic viral hepatitis C: Secondary | ICD-10-CM

## 2014-11-17 DIAGNOSIS — F17200 Nicotine dependence, unspecified, uncomplicated: Secondary | ICD-10-CM

## 2014-11-17 DIAGNOSIS — S069X7S Unspecified intracranial injury with loss of consciousness of any duration with death due to brain injury prior to regaining consciousness, sequela: Secondary | ICD-10-CM

## 2014-11-17 LAB — CBC
HCT: 44.5 % (ref 39.0–52.0)
Hemoglobin: 14.8 g/dL (ref 13.0–17.0)
MCH: 29.5 pg (ref 26.0–34.0)
MCHC: 33.3 g/dL (ref 30.0–36.0)
MCV: 88.8 fL (ref 78.0–100.0)
MPV: 12.9 fL — AB (ref 8.6–12.4)
PLATELETS: 223 10*3/uL (ref 150–400)
RBC: 5.01 MIL/uL (ref 4.22–5.81)
RDW: 14.1 % (ref 11.5–15.5)
WBC: 6.2 10*3/uL (ref 4.0–10.5)

## 2014-11-17 LAB — LIPID PANEL
CHOL/HDL RATIO: 7 ratio
CHOLESTEROL: 175 mg/dL (ref 0–200)
HDL: 25 mg/dL — ABNORMAL LOW (ref >=40)
LDL CALC: 128 mg/dL — AB (ref 0–99)
Triglycerides: 109 mg/dL (ref <150)
VLDL: 22 mg/dL (ref 0–40)

## 2014-11-17 LAB — BASIC METABOLIC PANEL
BUN: 6 mg/dL (ref 6–23)
CALCIUM: 9.8 mg/dL (ref 8.4–10.5)
CHLORIDE: 105 meq/L (ref 96–112)
CO2: 25 mEq/L (ref 19–32)
CREATININE: 0.86 mg/dL (ref 0.50–1.35)
GLUCOSE: 84 mg/dL (ref 70–99)
Potassium: 4.2 mEq/L (ref 3.5–5.3)
SODIUM: 139 meq/L (ref 135–145)

## 2014-11-17 MED ORDER — CARBAMAZEPINE 200 MG PO TABS: 200.0000 mg | ORAL_TABLET | Freq: Three times a day (TID) | ORAL | Status: DC

## 2014-11-17 MED ORDER — LORAZEPAM 0.5 MG PO TABS: 0.5000 mg | ORAL_TABLET | Freq: Three times a day (TID) | ORAL | Status: DC | PRN

## 2014-11-17 MED ORDER — OMEPRAZOLE 20 MG PO CPDR: 20.0000 mg | DELAYED_RELEASE_CAPSULE | Freq: Every day | ORAL | Status: DC

## 2014-11-17 NOTE — Progress Notes (Signed)
Subjective: Stephen Buckley is a 64 y.o. male with a history of TBI with memory deficits and mood disruption here for follow up.  He reports no new symptoms but admits that he has limited memory. His wife endorses continued mood swings which are worse since discontinuing carbamazepine and improved with ativan. These need refills.   He denies chest pain, SOB, weight loss.   He is still smoking cigarettes (~ 50 pack years) but is willing to quit for his health.   - Review of Systems: Denies fever, chills, weight loss, changes in vision or hearing, headache, cough, sore throat, chest pain, palpitations, shortness of breath, abdominal pain, nausea, vomiting, changes in bowel habits, blood in stool, change in bladder habits, myalgias, arthralgias, and rash.  - Medications: reviewed and updated; no allergies  Objective: BP 130/78 mmHg  Pulse 86  Temp(Src) 98.2 F (36.8 C) (Oral)  Ht 5\' 9"  (1.753 m)  Wt 193 lb 11.2 oz (87.862 kg)  BMI 28.59 kg/m2 Gen: Pleasant 64 y.o. male in no distress; smells of cigarette smoke.  HEENT: MMM, EOMI, PERRL, anicteric sclerae CV: Regular rate, no murmur, rub or gallop, no JVD Resp: Non-labored, CTAB, no wheezes noted Abd: Soft, NT, ND, BS present, no guarding or organomegaly MSK: No edema noted, full ROM Neuro: Alert and oriented, speech normal, remote memory not intact.   BMP Latest Ref Rng 11/27/2013 07/31/2013 03/01/2013  Glucose 70 - 99 mg/dL 91 87 96  BUN 6 - 23 mg/dL 10 10 11   Creatinine 0.50 - 1.35 mg/dL 0.76 0.77 0.79  Sodium 135 - 145 mEq/L 139 140 141  Potassium 3.5 - 5.3 mEq/L 4.0 4.0 3.4(L)  Chloride 96 - 112 mEq/L 107 105 107  CO2 19 - 32 mEq/L 21 27 26   Calcium 8.4 - 10.5 mg/dL 9.7 9.7 9.0   Assessment/Plan: Stephen Buckley is a 64 y.o. male here for medication refills, HTN, pulmonary nodule.

## 2014-11-17 NOTE — Assessment & Plan Note (Signed)
Brief intervention today shows that he is more ready to try to stop smoking. Did not have time to do full intervention, but he will schedule another appointment specifically for this. Reports h/o seizures so no wellbutrin.

## 2014-11-17 NOTE — Assessment & Plan Note (Addendum)
Now that treatment is completed, we will restart carbamazepine. They will follow up in about 6 months to check titers.

## 2014-11-17 NOTE — Assessment & Plan Note (Signed)
Restarting carbamazepine and stopping depakote (which his wife says he's forgotten to take anyway). Refilled ativan as his moods have been consistently difficult. His wife will supervise medication administrations.

## 2014-11-17 NOTE — Patient Instructions (Signed)
Go get the CT scan of your chest to look for that lung nodule. I will call you with those results as well as your lab results when I get them.  Make an appointment to see me for smoking cessation at your earliest convenience.  Start taking carbamazepine again. I have printed refills for all of your prescriptions.   Take care! - Dr. Bonner Puna
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
## 2014-11-18 ENCOUNTER — Encounter: Payer: Self-pay | Admitting: Family Medicine

## 2014-11-24 ENCOUNTER — Ambulatory Visit (HOSPITAL_COMMUNITY): Admission: RE | Admit: 2014-11-24 | Payer: Medicare Other | Source: Ambulatory Visit

## 2014-11-30 ENCOUNTER — Ambulatory Visit
Admission: RE | Admit: 2014-11-30 | Discharge: 2014-11-30 | Disposition: A | Discharge status: Home / Self Care | Payer: Medicare Other | Source: Ambulatory Visit | Attending: Nurse Practitioner | Admitting: Nurse Practitioner

## 2014-11-30 ENCOUNTER — Ambulatory Visit (HOSPITAL_COMMUNITY)
Admission: RE | Admit: 2014-11-30 | Discharge: 2014-11-30 | Disposition: A | Discharge status: Home / Self Care | Payer: Medicare Other | Source: Ambulatory Visit | Attending: Family Medicine | Admitting: Family Medicine

## 2014-11-30 DIAGNOSIS — K449 Diaphragmatic hernia without obstruction or gangrene: Secondary | ICD-10-CM | POA: Insufficient documentation

## 2014-11-30 DIAGNOSIS — R911 Solitary pulmonary nodule: Secondary | ICD-10-CM

## 2014-11-30 DIAGNOSIS — I700 Atherosclerosis of aorta: Secondary | ICD-10-CM | POA: Insufficient documentation

## 2014-11-30 DIAGNOSIS — I2510 Atherosclerotic heart disease of native coronary artery without angina pectoris: Secondary | ICD-10-CM | POA: Diagnosis not present

## 2014-11-30 DIAGNOSIS — K7469 Other cirrhosis of liver: Secondary | ICD-10-CM

## 2014-11-30 MED ORDER — GADOXETATE DISODIUM 0.25 MMOL/ML IV SOLN
8.0000 mL | Freq: Once | INTRAVENOUS | Status: AC | PRN
  Administered 2014-11-30: 8 mL via INTRAVENOUS

## 2014-12-01 ENCOUNTER — Ambulatory Visit (INDEPENDENT_AMBULATORY_CARE_PROVIDER_SITE_OTHER): Payer: Medicare Other | Admitting: Family Medicine

## 2014-12-01 DIAGNOSIS — F6381 Intermittent explosive disorder: Secondary | ICD-10-CM
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
## 2014-12-01 NOTE — Assessment & Plan Note (Signed)
Stable 24mm on CT exam June 2016. Considered benign, likely subpleural LN.

## 2014-12-01 NOTE — Assessment & Plan Note (Signed)
Worsened due to nonadherence which may have been simply due to the flawed thought process: He was taking one pill a day before (depakote) so he should only take 1 pill a day now (though carbamazepine is TID). We reviewed intensively his medication regimen and used teach back repeatedly. His wife will still oversee his medications and I hope he will go back to taking medications as directed (was on carbamazepine for years with good control) now that it has been explicitly states that this is required to control these outbursts (and thus, to maintain his marriage as his wife is intensely exasperated today and states that she is afraid it would "go to divorce court" if these didn't improve.

## 2014-12-01 NOTE — Patient Instructions (Signed)
You must take your medications as directed.  Schedule a follow up appointment soon to actually discuss smoking cessation.

## 2014-12-27 ENCOUNTER — Emergency Department (HOSPITAL_COMMUNITY): Payer: Medicare Other

## 2014-12-27 ENCOUNTER — Emergency Department (HOSPITAL_COMMUNITY)
Admission: EM | Admit: 2014-12-27 | Discharge: 2014-12-27 | Disposition: A | Discharge status: Home / Self Care | Payer: Medicare Other | Attending: Emergency Medicine | Admitting: Emergency Medicine

## 2014-12-27 ENCOUNTER — Encounter (HOSPITAL_COMMUNITY): Payer: Self-pay | Admitting: Emergency Medicine

## 2014-12-27 DIAGNOSIS — K219 Gastro-esophageal reflux disease without esophagitis: Secondary | ICD-10-CM | POA: Diagnosis not present

## 2014-12-27 DIAGNOSIS — R05 Cough: Secondary | ICD-10-CM | POA: Diagnosis present

## 2014-12-27 DIAGNOSIS — Z79899 Other long term (current) drug therapy: Secondary | ICD-10-CM | POA: Insufficient documentation

## 2014-12-27 DIAGNOSIS — K5904 Chronic idiopathic constipation: Secondary | ICD-10-CM

## 2014-12-27 DIAGNOSIS — K5909 Other constipation: Secondary | ICD-10-CM | POA: Diagnosis not present

## 2014-12-27 DIAGNOSIS — I10 Essential (primary) hypertension: Secondary | ICD-10-CM | POA: Diagnosis not present

## 2014-12-27 DIAGNOSIS — F329 Major depressive disorder, single episode, unspecified: Secondary | ICD-10-CM | POA: Diagnosis not present

## 2014-12-27 DIAGNOSIS — Z720 Tobacco use: Secondary | ICD-10-CM | POA: Diagnosis not present

## 2014-12-27 DIAGNOSIS — Z87820 Personal history of traumatic brain injury: Secondary | ICD-10-CM | POA: Insufficient documentation

## 2014-12-27 DIAGNOSIS — Z8619 Personal history of other infectious and parasitic diseases: Secondary | ICD-10-CM | POA: Diagnosis not present

## 2014-12-27 DIAGNOSIS — J45909 Unspecified asthma, uncomplicated: Secondary | ICD-10-CM | POA: Insufficient documentation

## 2014-12-27 DIAGNOSIS — Z8781 Personal history of (healed) traumatic fracture: Secondary | ICD-10-CM | POA: Diagnosis not present

## 2014-12-27 DIAGNOSIS — Z8669 Personal history of other diseases of the nervous system and sense organs: Secondary | ICD-10-CM | POA: Insufficient documentation

## 2014-12-27 DIAGNOSIS — Z862 Personal history of diseases of the blood and blood-forming organs and certain disorders involving the immune mechanism: Secondary | ICD-10-CM | POA: Diagnosis not present

## 2014-12-27 MED ORDER — OMEPRAZOLE 20 MG PO CPDR: 20.0000 mg | DELAYED_RELEASE_CAPSULE | Freq: Every day | ORAL | Status: DC

## 2014-12-27 MED ORDER — DOCUSATE SODIUM 100 MG PO CAPS
100.0000 mg | ORAL_CAPSULE | Freq: Once | ORAL | Status: AC
  Administered 2014-12-27: 100 mg via ORAL
  Filled 2014-12-27: qty 1

## 2014-12-27 MED ORDER — POLYETHYLENE GLYCOL 3350 17 GM/SCOOP PO POWD: 17.0000 g | Freq: Every day | ORAL | Status: DC

## 2014-12-27 MED ORDER — DOCUSATE SODIUM 100 MG PO CAPS: 100.0000 mg | ORAL_CAPSULE | Freq: Two times a day (BID) | ORAL | Status: DC

## 2014-12-27 MED ORDER — SUCRALFATE 1 GM/10ML PO SUSP: 1.0000 g | Freq: Three times a day (TID) | ORAL | Status: DC

## 2014-12-27 MED ORDER — BENZONATATE 100 MG PO CAPS
200.0000 mg | ORAL_CAPSULE | Freq: Once | ORAL | Status: AC
  Administered 2014-12-27: 200 mg via ORAL
  Filled 2014-12-27: qty 2

## 2014-12-27 MED ORDER — GI COCKTAIL ~~LOC~~
30.0000 mL | Freq: Once | ORAL | Status: AC
  Administered 2014-12-27: 30 mL via ORAL
  Filled 2014-12-27: qty 30

## 2014-12-27 NOTE — ED Notes (Signed)
Pt ambulating independently w/ steady gait on d/c in no acute distress, A&Ox4. D/c instructions reviewed w/ pt - pt denies any further questions or concerns at this time. Rx given x4
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
## 2014-12-27 NOTE — ED Notes (Signed)
Pt c/o productive cough with yellow/white sputum x 2-3 days. Pt states he did have episode of vomiting after cough tonight. Pt denies CP
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
## 2014-12-27 NOTE — Discharge Instructions (Signed)

## 2015-02-10 ENCOUNTER — Ambulatory Visit: Payer: Medicare Other | Admitting: Family Medicine

## 2015-03-10 ENCOUNTER — Ambulatory Visit: Payer: Medicare Other | Admitting: Family Medicine

## 2015-03-15 ENCOUNTER — Ambulatory Visit (INDEPENDENT_AMBULATORY_CARE_PROVIDER_SITE_OTHER): Payer: Medicare Other | Admitting: Family Medicine

## 2015-03-15 ENCOUNTER — Encounter: Payer: Self-pay | Admitting: Family Medicine

## 2015-03-15 VITALS — BP 144/75 | HR 79 | Temp 98.7°F | Ht 69.0 in | Wt 198.3 lb

## 2015-03-15 DIAGNOSIS — R059 Cough, unspecified: Secondary | ICD-10-CM

## 2015-03-15 DIAGNOSIS — R05 Cough: Secondary | ICD-10-CM

## 2015-03-15 MED ORDER — CETIRIZINE HCL 10 MG PO TABS: 10.0000 mg | ORAL_TABLET | Freq: Every day | ORAL | Status: DC

## 2015-03-15 MED ORDER — FLUTICASONE PROPIONATE 50 MCG/ACT NA SUSP: 2.0000 | Freq: Every day | NASAL | Status: DC

## 2015-03-15 NOTE — Progress Notes (Signed)
Subjective:    Stephen Buckley - 64 y.o. male MRN [id number]  Date of birth: 1951/01/11  HPI  Stephen Buckley is here for cough.  COUGH Imaging on 12/2014 showing bronchitis  Having some sinus congestion  Hx of asthma and COPD Onset: months  Course: getting worse    Worse with: worse at night. Worse when lying down  Better with: sitting up  Symptoms Sputum: intermittent Fever: no  Shortness of breath:no  Leg Swelling: no  Heart Burn or Reflux: yes  Wheezing:no  Post Nasal Drip: no   Red Flags Weight Loss:  no Immunocompromised:  no  PMH Asthma or COPD: yes  PMH of Smoking: yes  Using ACEIs: no  Only taking omeprazole, lorazepam and carbamazepine.   Health Maintenance:  Health Maintenance Due  Topic Date Due  . HIV Screening  03/29/1968  . COLONOSCOPY  03/30/2003  . ZOSTAVAX  03/29/2013  . INFLUENZA VACCINE  01/24/2015    -  reports that he has been smoking Cigarettes.  He has a 10 pack-year smoking history. He has never used smokeless tobacco. - Review of Systems: Per HPI. - Past Medical History: Patient Active Problem List   Diagnosis Date Noted  . Cough 03/16/2015  . Erectile dysfunction 11/27/2013  . Overweight(278.02) 08/28/2013  . Hepatitis C 08/07/2013  . Tobacco use 08/07/2013  . 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 TBI (traumatic brain injury) 03/04/2012   - Medications: reviewed and updated Current Outpatient Prescriptions  Medication Sig Dispense Refill  . carbamazepine (EPITOL) 200 MG tablet Take 1 tablet (200 mg total) by mouth 3 (three) times daily. 270 tablet 2  . cetirizine (ZYRTEC) 10 MG tablet Take 1 tablet (10 mg total) by mouth daily. 30 tablet 11  . fluticasone (FLONASE) 50 MCG/ACT nasal spray Place 2 sprays into both nostrils daily. 16 g 6  . LORazepam (ATIVAN) 0.5 MG tablet Take 1 tablet (0.5 mg total) by mouth 3 (three) times daily as needed for anxiety. 90 tablet 2  . omeprazole (PRILOSEC) 20 MG capsule Take 1  capsule (20 mg total) by mouth daily. 90 capsule 1  . Phenylephrine-APAP-Guaifenesin (MUCUS RELIEF COLD/SINUS MAX ST PO) Take 2 tablets by mouth every 6 (six) hours as needed (cough).    . polyethylene glycol powder (MIRALAX) powder Take 17 g by mouth daily. 255 g 0   No current facility-administered medications for this visit.   Review of Systems See HPI     Objective:   Physical Exam BP 144/75 mmHg  Pulse 79  Temp(Src) 98.7 F (37.1 C) (Oral)  Ht 5\' 9"  (1.753 m)  Wt 198 lb 4.8 oz (89.948 kg)  BMI 29.27 kg/m2 Gen: NAD, alert, cooperative with exam, well-appearing HEENT: NCAT, PERRL, clear conjunctiva, oropharynx clear, supple neck, no LAD, boggy turbinates b/l, no maxillary sinus TTP CV: RRR, good S1/S2, no murmur, no edema, capillary refill brisk  Resp: CTABL, no wheezes, non-labored Skin: no rashes, normal turgor  Neuro: no gross deficits.     Assessment & Plan:   Cough Reported duration of few months unsure if active this entire time Worse with lying down as he had no coughing during exam  Most likely related to post nasal drip +/- from allergic rhinitis  Imaging in July 2016 showing bronchitis  Hx of asthma which could be contributing if uncontrolled as sx's worse at night  Currently on omeprazole and denies any reflux sx's  Active tobacco abuse and no weight loss (neoplasia)  No edema or  increased waist size (HF)  - try flonase and zyrtec  - if no improvement could consider low dose CT vs referral to Dr. Valentina Lucks for PFT testing vs referral to GI for EGD vs ECHO  - advised to f/u in 3-4 weeks.

## 2015-03-15 NOTE — Patient Instructions (Signed)
Thank you for coming in,   I think your cough may be from post nasal drip.   We will try flonase and zytrec to help with this.   Please let us know if these medications do not help your cough.   Please bring all of your medications with you to each visit.   Sign up for My Chart to have easy access to your labs results, and communication with your Primary care physician   Please feel free to call with any questions or concerns at any time, at [phone]. --Dr. Raeford Razor

## 2015-03-16 DIAGNOSIS — R05 Cough: Secondary | ICD-10-CM | POA: Insufficient documentation

## 2015-03-16 DIAGNOSIS — J410 Simple chronic bronchitis: Secondary | ICD-10-CM | POA: Insufficient documentation

## 2015-03-16 NOTE — Assessment & Plan Note (Signed)
Reported duration of few months unsure if active this entire time Worse with lying down as he had no coughing during exam  Most likely related to post nasal drip +/- from allergic rhinitis  Imaging in July 2016 showing bronchitis  Hx of asthma which could be contributing if uncontrolled as sx's worse at night  Currently on omeprazole and denies any reflux sx's  Active tobacco abuse and no weight loss (neoplasia)  No edema or increased waist size (HF)  - try flonase and zyrtec  - if no improvement could consider low dose CT vs referral to Dr. Valentina Lucks for PFT testing vs referral to GI for EGD vs ECHO  - advised to f/u in 3-4 weeks.

## 2015-06-01 ENCOUNTER — Other Ambulatory Visit: Payer: Self-pay | Admitting: Family Medicine

## 2015-06-01 DIAGNOSIS — F6381 Intermittent explosive disorder: Secondary | ICD-10-CM

## 2015-06-01 DIAGNOSIS — S069X7S Unspecified intracranial injury with loss of consciousness of any duration with death due to brain injury prior to regaining consciousness, sequela: Secondary | ICD-10-CM

## 2015-06-01 NOTE — Telephone Encounter (Signed)
Needs refills on: omeprazole, lorazapam,  [facility] on [address]

## 2015-06-07 MED ORDER — LORAZEPAM 0.5 MG PO TABS: 0.5000 mg | ORAL_TABLET | Freq: Three times a day (TID) | ORAL | Status: DC | PRN

## 2015-06-07 MED ORDER — OMEPRAZOLE 20 MG PO CPDR: 20.0000 mg | DELAYED_RELEASE_CAPSULE | Freq: Every day | ORAL | Status: DC

## 2015-06-07 NOTE — Telephone Encounter (Signed)
Will you please let him know these prescriptions are ready for pick up? Thank you!

## 2015-06-24 ENCOUNTER — Encounter: Payer: Self-pay | Admitting: Family Medicine

## 2015-06-24 ENCOUNTER — Ambulatory Visit (INDEPENDENT_AMBULATORY_CARE_PROVIDER_SITE_OTHER): Payer: Medicare Other | Admitting: Family Medicine

## 2015-06-24 VITALS — BP 150/82 | HR 88 | Temp 98.3°F | Ht 69.0 in | Wt 195.0 lb

## 2015-06-24 DIAGNOSIS — S069X1D Unspecified intracranial injury with loss of consciousness of 30 minutes or less, subsequent encounter: Secondary | ICD-10-CM

## 2015-06-24 DIAGNOSIS — Z1159 Encounter for screening for other viral diseases: Secondary | ICD-10-CM | POA: Diagnosis not present

## 2015-06-24 DIAGNOSIS — F6381 Intermittent explosive disorder: Secondary | ICD-10-CM | POA: Diagnosis not present

## 2015-06-24 DIAGNOSIS — S069X7S Unspecified intracranial injury with loss of consciousness of any duration with death due to brain injury prior to regaining consciousness, sequela: Secondary | ICD-10-CM | POA: Diagnosis not present

## 2015-06-24 DIAGNOSIS — Z5181 Encounter for therapeutic drug level monitoring: Secondary | ICD-10-CM

## 2015-06-24 DIAGNOSIS — Z23 Encounter for immunization: Secondary | ICD-10-CM

## 2015-06-24 LAB — CBC
HCT: 46.9 % (ref 39.0–52.0)
Hemoglobin: 15.8 g/dL (ref 13.0–17.0)
MCH: 30.4 pg (ref 26.0–34.0)
MCHC: 33.7 g/dL (ref 30.0–36.0)
MCV: 90.2 fL (ref 78.0–100.0)
MPV: 12.7 fL — AB (ref 8.6–12.4)
Platelets: 209 10*3/uL (ref 150–400)
RBC: 5.2 MIL/uL (ref 4.22–5.81)
RDW: 13.6 % (ref 11.5–15.5)
WBC: 6.9 10*3/uL (ref 4.0–10.5)

## 2015-06-24 MED ORDER — QUETIAPINE FUMARATE ER 50 MG PO TB24: 50.0000 mg | ORAL_TABLET | Freq: Every day | ORAL | Status: DC

## 2015-06-24 MED ORDER — CARBAMAZEPINE 200 MG PO TABS: 200.0000 mg | ORAL_TABLET | Freq: Three times a day (TID) | ORAL | Status: DC

## 2015-06-24 MED ORDER — LORAZEPAM 0.5 MG PO TABS: 0.5000 mg | ORAL_TABLET | Freq: Three times a day (TID) | ORAL | Status: DC | PRN

## 2015-06-24 NOTE — Progress Notes (Signed)
Subjective: Stephen Buckley is a 64 y.o. male patient of mine presenting for episodic outbursts.  Today his wife reports all the same symptoms from previous visits - long outbursts of screaming and cursing aggressively, though no physical violence. This continue to get worse because he is not taking his medications. He states he forgets but will not usually take them when she reminds him. He reports suspicion at the time that she is mixing his medications up, though he denies this now. His wife says they will get a divorce if this continues and further states she has wanted to call the police in the past but has never done so. This has been a problem since his TBI (in addition to memory deficits), but worsening since not taking medications. Seroquel has helped in the past, specifically with his suspicions of others doing harm to him.   - ROS: No additional medications, prominent stressors. No SI/HI, changes in mood generally.  Objective: Gen: Well-appearing 64 y.o. male in no distress CV: Regular rate, no murmur Psych: Neatly groomed and appropriately dressed. Maintains good eye contact and is cooperative and attentive. Speech is normal volume and rate. Mood is depressed with a mildly restricted affect. Thought process is illogical. Does not appear to be responding to any internal stimuli. Has difficulty maintaining train of thought and concentrating on questions. Immediate recall 3/3. Remote recall 2/3. (same as last exam) Neurologic Exam: Alert and oriented x4, no focal deficits in sensation or motor function, patellar DTRs 2+ bilaterally. Fluent speech without aphasia. Gait slow but normal.  Skin: No rashes  Assessment/Plan: Stephen Buckley is a 64 y.o. 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  Episodic dyscontrol syndrome Ongoing problems related to medication nonadherence with significant consequences for his marriage which is his only  support structure.  - He will need to be seen by Dr. Naaman Plummer and screened by a psychologist for treatment. Referrals placed today.  - Restart seroquel. - Safety plan reviewed with him and his wife. - Case discussed with Dr. Gwenlyn Saran and Dr. Erin Hearing.  TBI (traumatic brain injury) With significant emotional/behavioral issues post-TBI. Has seen Dr. Naaman Plummer in the past and I believe he would derive the most benefit from seeing him again. I will refer him there today.

## 2015-06-24 NOTE — Patient Instructions (Addendum)
READ THIS EVERY DAY  - It's very important that you take the medications that are prescribed every day.  - Keep taking carbamazepine and lorazepam. - ADD SEROQUEL. Take this every day.  - I will refer you to behavioral health for an assessment.  - You should also be seen by Dr. Naaman Plummer again.  - If you are making any one afraid by what you are saying or doing, I want them to call the police.   Make an appointment in 1 week to see how the medication is working.   Our clinic's number is [phone]. Feel free to call any time with questions or concerns. We will answer any questions after hours with our 24-hour emergency line at that number as well.   - Dr. Bonner Puna

## 2015-06-25 LAB — HEPATITIS C ANTIBODY: HCV AB: REACTIVE — AB

## 2015-06-28 NOTE — Assessment & Plan Note (Addendum)
Ongoing problems related to medication nonadherence with significant consequences for his marriage which is his only support structure.  - He will need to be seen by Dr. Naaman Plummer and screened by a psychologist for treatment. Referrals placed today.  - Restart seroquel. - Safety plan reviewed with him and his wife. - Case discussed with Dr. Gwenlyn Saran and Dr. Erin Hearing.

## 2015-06-28 NOTE — Assessment & Plan Note (Signed)
With significant emotional/behavioral issues post-TBI. Has seen Dr. Naaman Plummer in the past and I believe he would derive the most benefit from seeing him again. I will refer him there today.

## 2015-06-29 LAB — HEPATITIS C RNA QUANTITATIVE: HCV Quantitative: NOT DETECTED IU/mL (ref <15)

## 2015-07-01 ENCOUNTER — Ambulatory Visit (INDEPENDENT_AMBULATORY_CARE_PROVIDER_SITE_OTHER): Payer: Medicare Other | Admitting: Family Medicine

## 2015-07-01 ENCOUNTER — Encounter: Payer: Self-pay | Admitting: Family Medicine

## 2015-07-01 VITALS — BP 149/78 | HR 77 | Temp 98.4°F | Wt 192.8 lb

## 2015-07-01 DIAGNOSIS — Z720 Tobacco use: Secondary | ICD-10-CM | POA: Diagnosis not present

## 2015-07-01 DIAGNOSIS — F6381 Intermittent explosive disorder: Secondary | ICD-10-CM

## 2015-07-01 MED ORDER — VARENICLINE TARTRATE 0.5 MG X 11 & 1 MG X 42 PO MISC: ORAL | Status: DC

## 2015-07-01 MED ORDER — QUETIAPINE FUMARATE ER 50 MG PO TB24: 50.0000 mg | ORAL_TABLET | Freq: Every day | ORAL | Status: DC

## 2015-07-01 NOTE — Assessment & Plan Note (Signed)
Symptoms significantly improved on low dose seroquel, tolerated well. Will continue this with counseling to increase to 100mg  nightly if symptoms return. Will recheck in 1 month or sooner if symptoms return.

## 2015-07-01 NOTE — Progress Notes (Signed)
Subjective: Stephen Buckley is a 65 y.o. male with a history of neurocognitive disorder following TBI, again accompanied by his wife, presenting for follow up of emotional outbursts.   He states things are overall much better since starting seroquel. He does not believe he has missed any doses of any medications and has not had an emotional outburst since that time, though he admits that he has significant memory deficits. He reports no adverse drug effects. His wife confirms these statements.   He is interested in quitting smoking and his wife has chantix and a stop date in 2 days. He has been smoking about 1 ppd for 20 years. Smokes first cigarette usually right after waking up, sometimes waits an hour or so. Has tried to quit unsuccessfully, not sure how many times. Never tried medications to stop in the past  Rates IMPORTANCE of quitting tobacco on 1-10 scale of 10. Rates READINESS of quitting tobacco on 1-10 scale of 10. Rates CONFIDENCE of quitting tobacco on 1-10 scale of 10.  + H/o seizures  Objective: BP 149/78 mmHg  Pulse 77  Temp(Src) 98.4 F (36.9 C) (Oral)  Wt 192 lb 12.8 oz (87.454 kg)  Gen:  65 y.o. male in NAD HEENT: MMM, EOMI, PERRL, anicteric sclerae CV: RRR, no MRG, no JVD Resp: Non-labored, CTAB, no wheezes noted  Assessment & Plan: Stephen Buckley is a 65 y.o. male here for episodic dyscontrol and tobacco cessation.     Episodic dyscontrol syndrome Symptoms significantly improved on low dose seroquel, tolerated well. Will continue this with counseling to increase to 100mg  nightly if symptoms return. Will recheck in 1 month or sooner if symptoms return.   Tobacco use Nicotine dependence of 20 years duration in a patient who is fair candidate for success. Family support (wife also quitting at same time), motivation and confidence argue positively for his chance of success with this atttampt, though he has a longer duration and early morning smoking.      Initiated varenicline tx, quit date. Patient counseled on purpose, proper use, and potential adverse effects, including GI upset, and potential change in mood.   Written information provided. Provided information on 1 800-QUIT NOW support program.

## 2015-07-01 NOTE — Assessment & Plan Note (Signed)
Nicotine dependence of 20 years duration in a patient who is fair candidate for success. Family support (wife also quitting at same time), motivation and confidence argue positively for his chance of success with this atttampt, though he has a longer duration and early morning smoking.     Initiated varenicline tx, quit date. Patient counseled on purpose, proper use, and potential adverse effects, including GI upset, and potential change in mood.   Written information provided. Provided information on 1 800-QUIT NOW support program.

## 2015-07-01 NOTE — Patient Instructions (Addendum)
Make a follow up appoi ntment to see how seroquel is doing for you in about 12 month. If you still have symptoms it is ok to increase the dose to 100mg  (take 2 tablets) each day. Please let me know if this happens.   You can always contact the [employer]: 24/7 toll-free at [employer] [phone]). Quit coaching is available by phone in [place] and [place], with translation service available for other languages

## 2015-08-02 ENCOUNTER — Other Ambulatory Visit: Payer: Self-pay | Admitting: *Deleted

## 2015-08-02 DIAGNOSIS — S069X7S Unspecified intracranial injury with loss of consciousness of any duration with death due to brain injury prior to regaining consciousness, sequela: Secondary | ICD-10-CM

## 2015-08-02 DIAGNOSIS — F6381 Intermittent explosive disorder: Secondary | ICD-10-CM

## 2015-08-03 MED ORDER — LORAZEPAM 0.5 MG PO TABS: 0.5000 mg | ORAL_TABLET | Freq: Three times a day (TID) | ORAL | Status: DC | PRN

## 2015-08-03 NOTE — Telephone Encounter (Signed)
LMOVM informing pt that "Rx was up front for pickup".  Fleeger, Salome Spotted, CMA

## 2015-08-15 ENCOUNTER — Ambulatory Visit (INDEPENDENT_AMBULATORY_CARE_PROVIDER_SITE_OTHER): Payer: Medicare Other | Admitting: Family Medicine

## 2015-08-15 ENCOUNTER — Encounter: Payer: Self-pay | Admitting: Family Medicine

## 2015-08-15 VITALS — BP 135/94 | HR 85 | Temp 97.9°F | Ht 69.0 in | Wt 197.1 lb

## 2015-08-15 DIAGNOSIS — E785 Hyperlipidemia, unspecified: Secondary | ICD-10-CM | POA: Diagnosis not present

## 2015-08-15 DIAGNOSIS — I10 Essential (primary) hypertension: Secondary | ICD-10-CM | POA: Insufficient documentation

## 2015-08-15 DIAGNOSIS — F6381 Intermittent explosive disorder: Secondary | ICD-10-CM

## 2015-08-15 MED ORDER — FLUTICASONE PROPIONATE 50 MCG/ACT NA SUSP: 2.0000 | Freq: Every day | NASAL | Status: DC

## 2015-08-15 MED ORDER — ATORVASTATIN CALCIUM 40 MG PO TABS: 40.0000 mg | ORAL_TABLET | Freq: Every day | ORAL | Status: DC

## 2015-08-15 MED ORDER — QUETIAPINE FUMARATE ER 150 MG PO TB24: 150.0000 mg | ORAL_TABLET | Freq: Every day | ORAL | Status: DC

## 2015-08-15 MED ORDER — CHLORTHALIDONE 25 MG PO TABS
25.0000 mg | ORAL_TABLET | Freq: Every day | ORAL | Status: DC
Start: 2015-08-15 — End: 2016-01-24

## 2015-08-15 NOTE — Assessment & Plan Note (Signed)
Starting lipitor 40mg  for 10-year ASCVD risk of 19%.

## 2015-08-15 NOTE — Patient Instructions (Addendum)
- Increase seroquel (quetiapine) from 50mg  dose to 150mg  dose starting today.  - I refilled your flonase nasal spray. Restart this to help with stuffy nose and cough.  - START taking lipitor 40mg  to lower your cholesterol. This helps prevent heart attacks and strokes - START taking chlorthalidone 25mg  to lower your blood pressure. Your blood pressure has been too high and need to be brought down to reduce your risk of heart attack and stroke.  - You can take ALL your medications at the same time. These are all once a day medications.  - Please follow up in 2 weeks for a nurse visit to recheck your blood pressure, and we should have another appointment in about 1 month. Schedule these on the way out today. If you feel faint, experience new/worsening chest pain or shortness of breath, or notice rapid leg swelling and/or weight gain you should call the clinic at [phone] OR go directly to the ER.   Take care,  - Dr. Bonner Puna  DASH Eating Plan DASH stands for "Dietary Approaches to Stop Hypertension." The DASH eating plan is a healthy eating plan that has been shown to reduce high blood pressure (hypertension). Additional health benefits may include reducing the risk of type 2 diabetes mellitus, heart disease, and stroke. The DASH eating plan may also help with weight loss. WHAT DO I NEED TO KNOW ABOUT THE DASH EATING PLAN? For the DASH eating plan, you will follow these general guidelines:  Choose foods with a percent daily value for sodium of less than 5% (as listed on the food label).  Use salt-free seasonings or herbs instead of table salt or sea salt.  Check with your health care provider or pharmacist before using salt substitutes.  Eat lower-sodium products, often labeled as "lower sodium" or "no salt added."  Eat fresh foods.  Eat more vegetables, fruits, and low-fat dairy products.  Choose whole grains. Look for the word "whole" as the first word in the ingredient list.  Choose fish  and skinless chicken or [place] more often than red meat. Limit fish, poultry, and meat to 6 oz (170 g) each day.  Limit sweets, desserts, sugars, and sugary drinks.  Choose heart-healthy fats.  Limit cheese to 1 oz (28 g) per day.  Eat more home-cooked food and less restaurant, buffet, and fast food.  Limit fried foods.  Cook foods using methods other than frying.  Limit canned vegetables. If you do use them, rinse them well to decrease the sodium.  When eating at a restaurant, ask that your food be prepared with less salt, or no salt if possible. WHAT FOODS CAN I EAT? Seek help from a dietitian for individual calorie needs. Grains Whole grain or whole wheat bread. Brown rice. Whole grain or whole wheat pasta. Quinoa, bulgur, and whole grain cereals. Low-sodium cereals. Corn or whole wheat flour tortillas. Whole grain cornbread. Whole grain crackers. Low-sodium crackers. Vegetables Fresh or frozen vegetables (raw, steamed, roasted, or grilled). Low-sodium or reduced-sodium tomato and vegetable juices. Low-sodium or reduced-sodium tomato sauce and paste. Low-sodium or reduced-sodium canned vegetables.  Fruits All fresh, canned (in natural juice), or frozen fruits. Meat and Other Protein Products Ground beef (85% or leaner), grass-fed beef, or beef trimmed of fat. Skinless chicken or [place]. Ground chicken or [place]. Pork trimmed of fat. All fish and seafood. Eggs. Dried beans, peas, or lentils. Unsalted nuts and seeds. Unsalted canned beans. Dairy Low-fat dairy products, such as skim or 1% milk, 2% or reduced-fat cheeses,  low-fat ricotta or cottage cheese, or plain low-fat yogurt. Low-sodium or reduced-sodium cheeses. Fats and Oils Tub margarines without trans fats. Light or reduced-fat mayonnaise and salad dressings (reduced sodium). Avocado. Safflower, olive, or canola oils. Natural peanut or almond butter. Other Unsalted popcorn and pretzels.  WHAT FOODS ARE NOT  RECOMMENDED? Grains White bread. White pasta. White rice. Refined cornbread. Bagels and croissants. Crackers that contain trans fat. Vegetables Creamed or fried vegetables. Vegetables in a cheese sauce. Regular canned vegetables. Regular canned tomato sauce and paste. Regular tomato and vegetable juices. Fruits Dried fruits. Canned fruit in light or heavy syrup. Fruit juice. Meat and Other Protein Products Fatty cuts of meat. Ribs, chicken wings, bacon, sausage, bologna, salami, chitterlings, fatback, hot dogs, bratwurst, and packaged luncheon meats. Salted nuts and seeds. Canned beans with salt. Dairy Whole or 2% milk, cream, half-and-half, and cream cheese. Whole-fat or sweetened yogurt. Full-fat cheeses or blue cheese. Nondairy creamers and whipped toppings. Processed cheese, cheese spreads, or cheese curds. Condiments Onion and garlic salt, seasoned salt, table salt, and sea salt. Canned and packaged gravies. Worcestershire sauce. Tartar sauce. Barbecue sauce. Teriyaki sauce. Soy sauce, including reduced sodium. Steak sauce. Fish sauce. Oyster sauce. Cocktail sauce. Horseradish. Ketchup and mustard. Meat flavorings and tenderizers. Bouillon cubes. Hot sauce. Tabasco sauce. Marinades. Taco seasonings. Relishes. Fats and Oils Butter, stick margarine, lard, shortening, ghee, and bacon fat. Coconut, palm kernel, or palm oils. Regular salad dressings. Other Pickles and olives. Salted popcorn and pretzels. The items listed above may not be a complete list of foods and beverages to avoid. Contact your dietitian for more information. WHERE CAN I FIND MORE INFORMATION? National Heart, Lung, and Blood Institute: travelstabloid.com

## 2015-08-15 NOTE — Progress Notes (Signed)
Subjective: Stephen Buckley is a 65 y.o. male returning for follow up of episodic dyscontrol and smoking cessation.   Taking all medicines as directed. Outbursts are better controlled - about 1 or 2 since last visit. He denies taking any blood pressure medications. No chest pain, shortness of breath, headache, vision changes. Does not check BP's outside the office. He sometimes worries about taking all medications at the same time.   - Still smoking and taking chantix, cutting down with his wife. No GI upset or change in dreams.   Objective: BP 135/94 mmHg  Pulse 85  Temp(Src) 97.9 F (36.6 C) (Oral)  Ht 5\' 9"  (1.753 m)  Wt 197 lb 1.6 oz (89.404 kg)  BMI 29.09 kg/m2 Gen: Pleasant, well-appearing 65 y.o. male in no distress CV: Regular rate, no murmur; radial, DP and PT pulses 2+ bilaterally; no LE edema, no JVD, cap refill < 2 sec. Pulm: Non-labored breathing ambient air; CTAB, no wheezes or crackles Neuro: Alert and oriented x4, no focal deficits in sensation or motor function. No dysmetria or dysdiadochokinesia. Fluent, slow speech without aphasia. Romberg negative. No pronator drift.  Assessment/Plan: Stephen Buckley is a 65 y.o. male here for various medical conditions.  Essential hypertension Above goal x 3 visits without symptoms on no medications. It has been difficult to get him to take medications, but better control is required. Will start long-acting chlorthalidone, check BP at nurese visit in 2 weeks and check BP and labs at 1 month follow up.   Episodic dyscontrol syndrome Symptoms have returned, though much improved. Because these episodes can be severe and he has no adverse effects we will increase to 150mg  of seroquel and check symptoms of follow up.   Dyslipidemia Starting lipitor 40mg  for 10-year ASCVD risk of 19%.

## 2015-08-15 NOTE — Assessment & Plan Note (Signed)
Above goal x 3 visits without symptoms on no medications. It has been difficult to get him to take medications, but better control is required. Will start long-acting chlorthalidone, check BP at nurese visit in 2 weeks and check BP and labs at 1 month follow up.

## 2015-08-15 NOTE — Assessment & Plan Note (Signed)
Symptoms have returned, though much improved. Because these episodes can be severe and he has no adverse effects we will increase to 150mg  of seroquel and check symptoms of follow up.

## 2015-08-23 ENCOUNTER — Ambulatory Visit (INDEPENDENT_AMBULATORY_CARE_PROVIDER_SITE_OTHER): Payer: Medicare Other | Admitting: *Deleted

## 2015-08-23 ENCOUNTER — Encounter: Payer: Self-pay | Admitting: *Deleted

## 2015-08-23 VITALS — BP 130/80 | HR 98

## 2015-08-23 DIAGNOSIS — Z136 Encounter for screening for cardiovascular disorders: Secondary | ICD-10-CM

## 2015-08-23 DIAGNOSIS — Z0130 Encounter for examination of blood pressure without abnormal findings: Secondary | ICD-10-CM

## 2015-08-23 NOTE — Progress Notes (Signed)
   Patient in nurse clinic for blood pressure check.  Patient is not complaining of any symptoms.  He does have concerns that the provider prescribed Lipitor and he has a history of Hep C.  Patient was told by the pharmacist to speak with his PCP regarding Lipitor and liver damage.  Advised patient that PCP is aware of his history of Hep C and would not prescribed any medication that would cause any further health issues.  Patient is requesting to PCP to give him a call regarding this matter.  Patient was very pleasant. Appointment for 09/19/15 for follow up of blood pressure and lab work with PCP.  Will forward to PCP.  Derl Barrow, RN

## 2015-08-25 NOTE — Progress Notes (Signed)
BP looks good. Communicated this with patient on the phone. After conferring with Dr. Valentina Lucks, I communicated to Stephen Buckley that statins are not contraindicated in his case. He has chronic and treated hepatitis C. We will check his LFTs once to verify that there are no elevations. He understands this and will follow up as previously discussed.

## 2015-09-19 ENCOUNTER — Ambulatory Visit: Payer: Medicare Other | Admitting: Family Medicine

## 2015-09-27 ENCOUNTER — Encounter: Payer: Self-pay | Admitting: Family Medicine

## 2015-09-27 ENCOUNTER — Ambulatory Visit (INDEPENDENT_AMBULATORY_CARE_PROVIDER_SITE_OTHER): Payer: Medicare Other | Admitting: Family Medicine

## 2015-09-27 VITALS — BP 146/77 | HR 94 | Temp 98.1°F | Ht 69.0 in | Wt 190.0 lb

## 2015-09-27 DIAGNOSIS — F6381 Intermittent explosive disorder: Secondary | ICD-10-CM

## 2015-09-27 DIAGNOSIS — Z720 Tobacco use: Secondary | ICD-10-CM | POA: Diagnosis not present

## 2015-09-27 DIAGNOSIS — S069X7S Unspecified intracranial injury with loss of consciousness of any duration with death due to brain injury prior to regaining consciousness, sequela: Secondary | ICD-10-CM

## 2015-09-27 MED ORDER — NICOTINE 21 MG/24HR TD PT24: 21.0000 mg | MEDICATED_PATCH | Freq: Every day | TRANSDERMAL | Status: DC

## 2015-09-27 MED ORDER — LORAZEPAM 0.5 MG PO TABS: 0.5000 mg | ORAL_TABLET | Freq: Three times a day (TID) | ORAL | Status: DC | PRN

## 2015-09-27 NOTE — Patient Instructions (Addendum)
Thank you for coming in today!  YOU MUST TAKE YOUR MEDICATIONS EVERY DAY AS DIRECTED.  - I have refilled your ativan. Please go ahead and get this filled.  - If you run out of medications you need to call the pharmacy (their number is on the bottle) to ask for refills.  - Start using the nicotine patches today and return in 4 weeks to see how they're working for you.   You can always contact the [employer]: 24/7 toll-free at [employer] [phone]). Quit coaching is available by phone.  Our clinic's number is [phone]. Feel free to call any time with questions or concerns. We will answer any questions after hours with our 24-hour emergency line at that number as well.   - Dr. Bonner Puna

## 2015-09-27 NOTE — Assessment & Plan Note (Signed)
Was not successful with chantix, but his wife is still on the patch doing well, so he is hopeful that this will help. Will start 21mg  as he smokes at or a little more than 1 ppd with goal of stopping and follow up in 4 weeks to consider cutting down dose.

## 2015-09-27 NOTE — Progress Notes (Signed)
Subjective: Stephen Buckley is a 65 y.o. male presenting for smoking cessation follow up.  He took all of chantix but was unable to completely quit and he believes the nicotine patches would help more, though he's never tried these. He denies side effects of chantix. He's still smoking first thing in the morning and about 1ppd.   He's been taking ativan prn for episodic outbursts which he says are "doing fine." He's almost out of these, but has refills on all other medications.   - ROS: Denies fever, headache, cough, CP, palpitations, SOB, abdominal pain, N/V/D, blood in stool, change in bladder habits, myalgias, arthralgias, and rash.   Objective: BP 146/77 mmHg  Pulse 94  Temp(Src) 98.1 F (36.7 C) (Oral)  Ht 5\' 9"  (1.753 m)  Wt 190 lb (86.183 kg)  BMI 28.05 kg/m2 Gen: Well-appearing 65 y.o. male in no distress CV: Regular rate, no murmur; radial, DP and PT pulses 2+ bilaterally; no LE edema, no JVD, cap refill < 2 sec. Pulm: Non-labored breathing ambient air; CTAB, no wheezes or crackles Neuro: Alert and oriented x4, no focal deficits in sensation or motor function. Psych: Neatly groomed and appropriately dressed. Maintains good eye contact and is cooperative and attentive. Speech is normal volume and rate. Mood is euthymic with a mildly restricted affect. Thought process is occasionally nonlinear without difficulty in redirecting. Has significant difficulty maintaining train of thought, but is able to concentrate on questions. No suicidal or homicidal ideation.   Assessment/Plan: [name] is a 65 y.o. male here for smoking cessation and medication refills.  Tobacco use Was not successful with chantix, but his wife is still on the patch doing well, so he is hopeful that this will help. Will start 21mg  as he smokes at or a little more than 1 ppd with goal of stopping and follow up in 4 weeks to consider cutting down dose.

## 2015-10-05 ENCOUNTER — Other Ambulatory Visit: Payer: Self-pay | Admitting: Family Medicine

## 2015-10-17 ENCOUNTER — Telehealth: Payer: Self-pay | Admitting: *Deleted

## 2015-10-17 DIAGNOSIS — E785 Hyperlipidemia, unspecified: Secondary | ICD-10-CM

## 2015-10-17 MED ORDER — ATORVASTATIN CALCIUM 40 MG PO TABS: 40.0000 mg | ORAL_TABLET | Freq: Every day | ORAL | Status: DC

## 2015-10-17 NOTE — Telephone Encounter (Signed)
[facility] called to see if there was a possibility of getting pt Lipitor change to a 90 day supply, they are trying to contact providers to see if this is a possibility to possibly help pt save money. Forwarding to PCP to be advised. Katharina Caper, Yaqub Arney D, [place]

## 2015-11-11 ENCOUNTER — Other Ambulatory Visit: Payer: Self-pay | Admitting: Family Medicine

## 2015-11-14 NOTE — Telephone Encounter (Signed)
Pt is calling for a refill on his Prilosec and Lorazepam. Please let him know when to pick up the Lorazepam. jw

## 2015-11-15 NOTE — Telephone Encounter (Signed)
Please let Stephen Buckley know this will be available for pick up this afternoon.

## 2015-11-16 NOTE — Telephone Encounter (Signed)
LVM for pt to call back to inform of below. Zimmerman Rumple, April D, CMA  

## 2015-11-17 NOTE — Telephone Encounter (Signed)
LVM for pt to call office to inform him of below. Zimmerman Rumple, April D, CMA  

## 2015-11-29 NOTE — Telephone Encounter (Signed)
Patient is aware that script is ready for pick up. Stephen Buckley,CMA  

## 2015-12-05 ENCOUNTER — Ambulatory Visit: Payer: Medicare Other | Admitting: Family Medicine

## 2015-12-26 ENCOUNTER — Other Ambulatory Visit: Payer: Self-pay | Admitting: *Deleted

## 2015-12-26 MED ORDER — LORAZEPAM 0.5 MG PO TABS: 0.5000 mg | ORAL_TABLET | Freq: Three times a day (TID) | ORAL | Status: DC | PRN

## 2015-12-30 ENCOUNTER — Ambulatory Visit (INDEPENDENT_AMBULATORY_CARE_PROVIDER_SITE_OTHER): Payer: Medicare Other | Admitting: Student in an Organized Health Care Education/Training Program

## 2015-12-30 ENCOUNTER — Encounter: Payer: Self-pay | Admitting: Student in an Organized Health Care Education/Training Program

## 2015-12-30 VITALS — BP 144/85 | HR 80 | Temp 98.1°F | Wt 190.0 lb

## 2015-12-30 DIAGNOSIS — J302 Other seasonal allergic rhinitis: Secondary | ICD-10-CM | POA: Diagnosis not present

## 2015-12-30 DIAGNOSIS — Z720 Tobacco use: Secondary | ICD-10-CM | POA: Diagnosis not present

## 2015-12-30 DIAGNOSIS — E785 Hyperlipidemia, unspecified: Secondary | ICD-10-CM

## 2015-12-30 DIAGNOSIS — F6381 Intermittent explosive disorder: Secondary | ICD-10-CM

## 2015-12-30 LAB — LIPID PANEL
Cholesterol: 138 mg/dL (ref 125–200)
HDL: 34 mg/dL — AB (ref >=40)
LDL Cholesterol: 88 mg/dL (ref <130)
Total CHOL/HDL Ratio: 4.1 Ratio (ref <=5.0)
Triglycerides: 78 mg/dL (ref <150)
VLDL: 16 mg/dL (ref <30)

## 2015-12-30 MED ORDER — LORAZEPAM 0.5 MG PO TABS: 0.5000 mg | ORAL_TABLET | Freq: Three times a day (TID) | ORAL | Status: DC | PRN

## 2015-12-30 MED ORDER — CETIRIZINE HCL 10 MG PO TABS: 10.0000 mg | ORAL_TABLET | Freq: Every day | ORAL | Status: DC

## 2015-12-30 NOTE — Assessment & Plan Note (Addendum)
Patient endorses motivation to quit smoking with unsuccessful past attempts.  Patient was encouraged to follow up with another appointment with either me or Dr. Valentina Lucks to focus primarily on tobacco cessation.

## 2015-12-30 NOTE — Assessment & Plan Note (Addendum)
Patient endorses rhinorrhea and congestion with post-nasal drip.  No wheezing or increased work of breathing.  Symptoms are consistent with seasonal allergies. - Restarted patient's previous zyrtec  - Patient already has fluticasone, will cont to use - Recommended patient use neti pot OTC

## 2015-12-30 NOTE — Patient Instructions (Addendum)
It was a pleasure seeing you today in our clinic. Today we discussed your allergies and your episodic dyscontrol syndrome. Here is the treatment plan we have discussed and agreed upon together:  For your episodic dyscontrol syndrome: - We will continue the Ativan (Lorazepam) as it has helped control your outburst symptoms. - Today you reported that you have been taking all of your medications appropriately. Please continue take all of your medications as prescribed.  For your seasonal allergies: - We added Zyrtec which you can take once each day. - Continue using fluticasone nasal spray two puffs once per day. - Use Neti Pot nasal flush (over the counter at the pharmacy) to help relieve your symptoms of nasal congestion.  For quitting smoking: - I know this is very important to you, and it is important to me too.  - Please schedule a follow up visit either with myself or with our tobacco cessation specialist, Dr. Valentina Lucks so that we may address quitting smoking in its own clinic visit.  Our clinic's number is [phone]. Feel free to call any time with questions or concerns.

## 2015-12-30 NOTE — Progress Notes (Signed)
   CC: episodic dyscontrol syndrome follow up  HPI: Stephen Buckley is a 65 y.o. male who presents to [facility] today for follow up of episodic dyscontrol syndrome.   Patient reports that he has been taking all of his medications as prescribed, and he brought in his meds for review at this visit.  He reports some difficulty with memory, however feels that his outburst episodes have been better controlled on current medication regimen.  He reports one dyscontrol episode in past two weeks, which was after his prescription of Ativan ran out.  He reports recent seasonal allergy symptoms of allergic rhinitis and congestion.  He endorses previously taking Zyrtec which he is now out of.  He uses flonase daily.  He endorses that he is very motivated to quit smoking cigarettes, but has been unsuccessful in the past.   For his dyslipidemia, he reports taking his statin medication daily as prescribed without any barriers.   Denies chest pain, dyspnea on exertion, claudication.     Review of Symptoms: See HPI for ROS. All other systems reviewed and are negative. Pertinently, no  palpitations, fever, chills, abdominal pain, N/V/D.    CC, SH/smoking status, and VS noted.  Objective: BP 144/85 mmHg  Pulse 80  Temp(Src) 98.1 F (36.7 C) (Oral)  Wt 190 lb (86.183 kg) GEN: NAD, alert, cooperative, and pleasant. EYE: no conjunctival injection, pupils equally round and reactive to light ENMT: normal tympanic light reflex, no nasal polyps, +rhinorrhea, no pharyngeal erythema or exudates NECK: full ROM, no thyromegally RESPIRATORY: clear to auscultation bilaterally with no wheezes, rhonchi or rales, good effort CV: RRR, no m/r/g, no peripheral edema PSYCH: AAOx3, appropriate affect   Assessment and plan:  Tobacco use Patient endorses motivation to quit smoking with unsuccessful past attempts.  Patient was encouraged to follow up with another appointment with either me or Dr. Valentina Lucks to focus primarily on  tobacco cessation.  Dyslipidemia Patient indicates he has been taking atorvastatin daily as prescribed.   - Will do lipid panel today given that LDL was elevated on previous test over a year ago to assess for cholesterol control  Seasonal allergies Patient endorses rhinorrhea and congestion with post-nasal drip.  No wheezing or increased work of breathing.  Symptoms are consistent with seasonal allergies. - Restarted patient's previous zyrtec  - Patient already has fluticasone, will cont to use - Recommended patient use neti pot OTC   Episodic dyscontrol syndrome Patient endorses improved control of episodes on current medication regimen, with only one outburst in the past 2 weeks, after he ran out of ativan. - Patient reports medication compliance without any missed doses, which is an improvement from previous encounters with his last PCP - continue current regimen: Ativan .5 mg and Seroquel XR 150 mg.     Orders Placed This Encounter  Procedures  . Lipid Panel    Meds ordered this encounter  Medications  . cetirizine (ZYRTEC) 10 MG tablet    Sig: Take 1 tablet (10 mg total) by mouth daily.    Dispense:  30 tablet    Refill:  11  . LORazepam (ATIVAN) 0.5 MG tablet    Sig: Take 1 tablet (0.5 mg total) by mouth 3 (three) times daily as needed. for anxiety    Dispense:  90 tablet    Refill:  0     Everrett Coombe, MD,MS,  PGY1 12/30/2015 4:06 PM

## 2015-12-30 NOTE — Assessment & Plan Note (Addendum)
Patient indicates he has been taking atorvastatin daily as prescribed.   - Will do lipid panel today given that LDL was elevated on previous test over a year ago to assess for cholesterol control

## 2015-12-30 NOTE — Assessment & Plan Note (Signed)
Patient endorses improved control of episodes on current medication regimen, with only one outburst in the past 2 weeks, after he ran out of ativan. - Patient reports medication compliance without any missed doses, which is an improvement from previous encounters with his last PCP - continue current regimen: Ativan .5 mg and Seroquel XR 150 mg.

## 2016-01-24 ENCOUNTER — Other Ambulatory Visit: Payer: Self-pay | Admitting: *Deleted

## 2016-01-24 DIAGNOSIS — I10 Essential (primary) hypertension: Secondary | ICD-10-CM

## 2016-01-24 MED ORDER — CHLORTHALIDONE 25 MG PO TABS: 25.0000 mg | ORAL_TABLET | Freq: Every day | ORAL | 1 refills | Status: DC

## 2016-01-27 ENCOUNTER — Telehealth: Payer: Self-pay | Admitting: Student in an Organized Health Care Education/Training Program

## 2016-01-27 DIAGNOSIS — I10 Essential (primary) hypertension: Secondary | ICD-10-CM

## 2016-01-27 MED ORDER — CHLORTHALIDONE 25 MG PO TABS: 25.0000 mg | ORAL_TABLET | Freq: Every day | ORAL | 1 refills | Status: DC

## 2016-01-27 NOTE — Telephone Encounter (Signed)
Pt called because his prescription for Chlorthalidone has not been received by the pharmacy. Can we check on this since in Epic we sent this on 01/24/16. jw

## 2016-01-27 NOTE — Telephone Encounter (Signed)
Received refill request for [place] 25 mg from [facility].  Medication was approved for refill on 01/24/16, but stated print.  Rx resent electronically.  Derl Barrow, RN

## 2016-02-08 ENCOUNTER — Other Ambulatory Visit: Payer: Self-pay | Admitting: *Deleted

## 2016-02-08 DIAGNOSIS — F6381 Intermittent explosive disorder: Secondary | ICD-10-CM

## 2016-02-13 ENCOUNTER — Other Ambulatory Visit: Payer: Self-pay | Admitting: Student in an Organized Health Care Education/Training Program

## 2016-02-13 DIAGNOSIS — F6381 Intermittent explosive disorder: Secondary | ICD-10-CM

## 2016-02-13 MED ORDER — QUETIAPINE FUMARATE ER 150 MG PO TB24: 150.0000 mg | ORAL_TABLET | Freq: Every day | ORAL | 1 refills | Status: DC

## 2016-02-24 ENCOUNTER — Other Ambulatory Visit: Payer: Self-pay | Admitting: *Deleted

## 2016-02-24 DIAGNOSIS — F6381 Intermittent explosive disorder: Secondary | ICD-10-CM

## 2016-02-24 NOTE — Telephone Encounter (Signed)
Patient also need refill on Ativan

## 2016-02-26 MED ORDER — OMEPRAZOLE 20 MG PO CPDR: 20.0000 mg | DELAYED_RELEASE_CAPSULE | Freq: Every day | ORAL | 0 refills | Status: DC

## 2016-02-26 MED ORDER — LORAZEPAM 0.5 MG PO TABS: 0.5000 mg | ORAL_TABLET | Freq: Three times a day (TID) | ORAL | 0 refills | Status: DC | PRN

## 2016-03-02 ENCOUNTER — Other Ambulatory Visit: Payer: Self-pay | Admitting: Student in an Organized Health Care Education/Training Program

## 2016-03-02 DIAGNOSIS — F6381 Intermittent explosive disorder: Secondary | ICD-10-CM

## 2016-03-02 MED ORDER — LORAZEPAM 0.5 MG PO TABS: 0.5000 mg | ORAL_TABLET | Freq: Three times a day (TID) | ORAL | 0 refills | Status: DC | PRN

## 2016-03-27 ENCOUNTER — Other Ambulatory Visit: Payer: Self-pay | Admitting: *Deleted

## 2016-03-27 DIAGNOSIS — F6381 Intermittent explosive disorder: Secondary | ICD-10-CM

## 2016-03-27 MED ORDER — CARBAMAZEPINE 200 MG PO TABS: 200.0000 mg | ORAL_TABLET | Freq: Three times a day (TID) | ORAL | 2 refills | Status: DC

## 2016-03-27 MED ORDER — LORAZEPAM 0.5 MG PO TABS: 0.5000 mg | ORAL_TABLET | Freq: Three times a day (TID) | ORAL | 0 refills | Status: DC | PRN

## 2016-05-07 ENCOUNTER — Other Ambulatory Visit: Payer: Self-pay | Admitting: Student in an Organized Health Care Education/Training Program

## 2016-05-25 ENCOUNTER — Ambulatory Visit (INDEPENDENT_AMBULATORY_CARE_PROVIDER_SITE_OTHER): Payer: Medicare Other | Admitting: Student in an Organized Health Care Education/Training Program

## 2016-05-25 ENCOUNTER — Encounter: Payer: Self-pay | Admitting: Student in an Organized Health Care Education/Training Program

## 2016-05-25 DIAGNOSIS — F6381 Intermittent explosive disorder: Secondary | ICD-10-CM

## 2016-05-25 DIAGNOSIS — S069X9D Unspecified intracranial injury with loss of consciousness of unspecified duration, subsequent encounter: Secondary | ICD-10-CM | POA: Diagnosis not present

## 2016-05-25 MED ORDER — QUETIAPINE FUMARATE ER 150 MG PO TB24: 150.0000 mg | ORAL_TABLET | Freq: Every day | ORAL | 1 refills | Status: DC

## 2016-05-25 MED ORDER — LORAZEPAM 0.5 MG PO TABS: 0.5000 mg | ORAL_TABLET | Freq: Two times a day (BID) | ORAL | 0 refills | Status: AC
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
## 2016-05-25 NOTE — Patient Instructions (Addendum)
It was a pleasure seeing you today in our clinic. Today we discussed your outburst syndrome. Here is the treatment plan we have discussed and agreed upon together:  - I refilled your seroquel and your ativan for one month - You need to taper off of the ativan, please only take this TWICE DAILY as needed. - It is not in the interest of your best care for me to continue to prescribe this medication, so we need to gradually taper it.  We will decrease to twice daily this month. - Please ask your wife to join you at your next visit, because she may be able to help you answer questions with your memory difficulties.  Please call to schedule an appointment with [facility] facility in [place]  [phone]  Our clinic's number is [phone]. Please call with questions or concerns about what we discussed today.  Be well, Dr. Burr Medico

## 2016-05-26 NOTE — Assessment & Plan Note (Signed)
Patient has memory deficits and is unable to give a clear history.  - Asked if his wife could accompany him to future visit to help provide history

## 2016-05-26 NOTE — Assessment & Plan Note (Signed)
-   Patient continues to have outbursts especially when he is out of ativan - concern about patient having ativan with poor memory as this medication can be sedation and dangerous if taken inappropriately - patient was educated on proper use of ativan, states he is taking no more than prescribed - recommend tapering Ativan, will decrease from TID to BID PRN and plan to taper to once daily next month - Continue current regimen: Ativan 0.5 mg but taper to BID PRN and Seroquel XR 150 mg qhs - Discussed that his episodic dyscontrol syndrome may be better managed by a psychiatrist - referred to [facility], gave information, asked patient also to follow up with wife at next visit because his memory deficits make it difficult for him to make an appointment and follow up with psych

## 2016-06-03 ENCOUNTER — Encounter (HOSPITAL_COMMUNITY): Payer: Self-pay | Admitting: Emergency Medicine

## 2016-06-03 ENCOUNTER — Emergency Department (HOSPITAL_COMMUNITY)
Admission: EM | Admit: 2016-06-03 | Discharge: 2016-06-03 | Disposition: A | Discharge status: Home / Self Care | Payer: Medicare Other | Attending: Emergency Medicine | Admitting: Emergency Medicine

## 2016-06-03 ENCOUNTER — Emergency Department (HOSPITAL_COMMUNITY): Payer: Medicare Other

## 2016-06-03 DIAGNOSIS — J45909 Unspecified asthma, uncomplicated: Secondary | ICD-10-CM | POA: Diagnosis not present

## 2016-06-03 DIAGNOSIS — W010XXA Fall on same level from slipping, tripping and stumbling without subsequent striking against object, initial encounter: Secondary | ICD-10-CM | POA: Insufficient documentation

## 2016-06-03 DIAGNOSIS — S20212A Contusion of left front wall of thorax, initial encounter: Secondary | ICD-10-CM | POA: Diagnosis not present

## 2016-06-03 DIAGNOSIS — Y939 Activity, unspecified: Secondary | ICD-10-CM | POA: Insufficient documentation

## 2016-06-03 DIAGNOSIS — W19XXXA Unspecified fall, initial encounter: Secondary | ICD-10-CM

## 2016-06-03 DIAGNOSIS — Y929 Unspecified place or not applicable: Secondary | ICD-10-CM | POA: Diagnosis not present

## 2016-06-03 DIAGNOSIS — F17210 Nicotine dependence, cigarettes, uncomplicated: Secondary | ICD-10-CM | POA: Insufficient documentation

## 2016-06-03 DIAGNOSIS — Y999 Unspecified external cause status: Secondary | ICD-10-CM | POA: Diagnosis not present

## 2016-06-03 DIAGNOSIS — R079 Chest pain, unspecified: Secondary | ICD-10-CM | POA: Diagnosis not present

## 2016-06-03 DIAGNOSIS — S299XXA Unspecified injury of thorax, initial encounter: Secondary | ICD-10-CM | POA: Diagnosis not present

## 2016-06-03 DIAGNOSIS — I10 Essential (primary) hypertension: Secondary | ICD-10-CM | POA: Insufficient documentation

## 2016-06-03 MED ORDER — ACETAMINOPHEN 500 MG PO TABS: 500.0000 mg | ORAL_TABLET | Freq: Four times a day (QID) | ORAL | 0 refills | Status: DC | PRN

## 2016-06-03 NOTE — ED Triage Notes (Signed)
Pt reports he slipped on ice this am and fell onto rocks. Pain got worse as morning progressed. Pain in L lower ribs only with movement. No head injury or LOC. No SOB or dizziness.
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
## 2016-06-03 NOTE — Discharge Instructions (Signed)
Read the information below.  Use the prescribed medication as directed.  Please discuss all new medications with your pharmacist.  You may return to the Emergency Department at any time for worsening condition or any new symptoms that concern you.    If you develop fevers, increased chest pain, cough, shortness of breath, or abdominal pain, return to the Emergency Department immediately for a recheck.    Please take deep breaths at regular intervals throughout the day to prevent pneumonia.

## 2016-06-03 NOTE — ED Notes (Signed)
PT DISCHARGED. INSTRUCTIONS AND PRESCRIPTION GIVEN. AAOX4. PT IN NO APPARENT DISTRESS. THE OPPORTUNITY TO ASK QUESTIONS WAS PROVIDED. 

## 2016-06-09 ENCOUNTER — Encounter (HOSPITAL_COMMUNITY): Payer: Self-pay | Admitting: Emergency Medicine

## 2016-06-09 ENCOUNTER — Emergency Department (HOSPITAL_COMMUNITY)
Admission: EM | Admit: 2016-06-09 | Discharge: 2016-06-09 | Disposition: A | Discharge status: Home / Self Care | Payer: Medicare Other | Attending: Emergency Medicine | Admitting: Emergency Medicine

## 2016-06-09 ENCOUNTER — Emergency Department (HOSPITAL_COMMUNITY): Payer: Medicare Other

## 2016-06-09 DIAGNOSIS — J45909 Unspecified asthma, uncomplicated: Secondary | ICD-10-CM | POA: Diagnosis not present

## 2016-06-09 DIAGNOSIS — Y939 Activity, unspecified: Secondary | ICD-10-CM | POA: Diagnosis not present

## 2016-06-09 DIAGNOSIS — Y929 Unspecified place or not applicable: Secondary | ICD-10-CM | POA: Insufficient documentation

## 2016-06-09 DIAGNOSIS — F17210 Nicotine dependence, cigarettes, uncomplicated: Secondary | ICD-10-CM | POA: Diagnosis not present

## 2016-06-09 DIAGNOSIS — R0789 Other chest pain: Secondary | ICD-10-CM | POA: Insufficient documentation

## 2016-06-09 DIAGNOSIS — I10 Essential (primary) hypertension: Secondary | ICD-10-CM | POA: Insufficient documentation

## 2016-06-09 DIAGNOSIS — Y999 Unspecified external cause status: Secondary | ICD-10-CM | POA: Diagnosis not present

## 2016-06-09 DIAGNOSIS — W19XXXA Unspecified fall, initial encounter: Secondary | ICD-10-CM | POA: Insufficient documentation

## 2016-06-09 DIAGNOSIS — R0781 Pleurodynia: Secondary | ICD-10-CM | POA: Diagnosis not present

## 2016-06-09 MED ORDER — HYDROCODONE-ACETAMINOPHEN 5-325 MG PO TABS: 1.0000 | ORAL_TABLET | ORAL | 0 refills | Status: DC | PRN

## 2016-06-09 NOTE — ED Triage Notes (Signed)
Pt reports continued left ribcage pain related to fall 12/10. Pt verbalizes here related to worsening pain with sneezing.
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
## 2016-06-09 NOTE — Discharge Instructions (Signed)
Use heat on the sore area 3-4 times a day.  See the doctor of your choice, as needed, for problems.

## 2016-07-19 ENCOUNTER — Other Ambulatory Visit: Payer: Self-pay | Admitting: Student in an Organized Health Care Education/Training Program

## 2016-07-19 ENCOUNTER — Telehealth: Payer: Self-pay | Admitting: *Deleted

## 2016-07-19 ENCOUNTER — Ambulatory Visit (INDEPENDENT_AMBULATORY_CARE_PROVIDER_SITE_OTHER): Payer: Medicare Other | Admitting: Family Medicine

## 2016-07-19 VITALS — BP 156/86 | HR 87 | Temp 98.0°F | Ht 69.0 in | Wt 196.0 lb

## 2016-07-19 DIAGNOSIS — K5900 Constipation, unspecified: Secondary | ICD-10-CM | POA: Diagnosis not present

## 2016-07-19 MED ORDER — LORAZEPAM 0.5 MG PO TABS: 0.5000 mg | ORAL_TABLET | Freq: Every day | ORAL | 0 refills | Status: DC

## 2016-07-19 MED ORDER — POLYETHYLENE GLYCOL 3350 17 GM/SCOOP PO POWD: 17.0000 g | Freq: Every day | ORAL | 5 refills | Status: DC

## 2016-07-19 NOTE — Telephone Encounter (Signed)
Called and spoke with patient. We had discussed tapering this medication each month, this month will taper to 0.5 mg Ativan once daily.  Plan to come off ativan next month.  I will refill ativan and make available for him to pick up at front desk.  Reminded patient we had discussed calling [facility] to set up a psychiatry appointment at his last visit for his episodic dyscontrol syndrome.  Gave him the information again to make an appointment and he voices understanding and states he will call.

## 2016-07-19 NOTE — Patient Instructions (Signed)
Thank you for coming in to clinic today.  1. Your symptoms are consistent with Constipation, likely cause of your General Abdominal Pain / Cramping. 2. Start with Miralax sent to pharmacy. First dose 68g (4 capfuls) in 32oz water over 1 to 2 hours for clean out. Next day start 17g or 1 capful daily, may adjust dose up or down by half a capful every few days. Recommend to take this medicine daily for next 1-2 weeks, then may need to use it longer if needed. - Goal is to have soft regular bowel movement 1-3x daily, if too runny or diarrhea, then reduce dose of the medicine  Improve water intake, hydration will help Also recommend increased vegetables, fruits, fiber intake Can try daily Metamucil or Fiber supplement at pharmacy over the counter  Follow-up if symptoms are not improving with bowel movements, or if pain worsens, develop fevers, nausea, vomiting.  Please schedule a follow-up appointment with Dr Burr Medico in 1 month to follow-up Constipation  If you have any other questions or concerns, please feel free to call the clinic to contact me. You may also schedule an earlier appointment if necessary.  However, if your symptoms get significantly worse, please go to the Emergency Department to seek immediate medical attention.

## 2016-07-19 NOTE — Progress Notes (Signed)
   Subjective: CC: constipation [id number] Stephen Buckley is a 66 y.o. male presenting to clinic today for same day appointment. PCP: Everrett Coombe, MD Concerns today include:  1. Constipation Patient reports that he has had about 1 month duration of pellet like stools.  He reports straining.  Denies hematachezia, melena, nausea, vomiting, abdominal pain.  He reports fair fluid intake.  He has been using 1 capful of Miralax daily with little improvement in symptoms.  No Known Allergies  Social Hx reviewed. MedHx, current medications and allergies reviewed.  Please see EMR.  Health Maintenance: Pneumonia Vaccine needed: yes ROS: Per HPI  Objective: Office vital signs reviewed. BP (!) 156/86   Pulse 87   Temp 98 F (36.7 C) (Oral)   Ht 5\' 9"  (1.753 m)   Wt 196 lb (88.9 kg)   SpO2 99%   BMI 28.94 kg/m   Physical Examination:  General: Awake, alert, well nourished, No acute distress Cardio: regular rate Pulm: normal work of breathing on room air GI: soft, non-tender, protuberant but non-distended, bowel sounds present x4, no hepatomegaly, no splenomegaly, no masses  Assessment/ Plan: 66 y.o. male   1. Constipation, unspecified constipation type.  No red flags to suggest SBO, ileus, malignancy, etc. - Miralax cleanout instructions provided.  Patient to mix 4 capfuls in 32 ounces of water and drink over 2 hours.  He will titrate Miralax daily to maintain 1-3 soft BMs daily. - Water intake encouraged - Fiber intake encouraged - Daily physical activity encouraged - Home instructions reviewed and provided, see AVS - Recommend routine screening colonoscopy. - Follow up with PCP in 1 month or sooner if needed.   Janora Norlander, DO PGY-3, [facility] Res[facility]

## 2016-07-19 NOTE — Telephone Encounter (Signed)
Pt came into clinic today for same day appointment and requested a refill on his Lorazepam 1 tab 2 xdaily. Routing to PCP. Katharina Caper, Albirtha Grinage D, [place]

## 2016-08-17 ENCOUNTER — Telehealth: Payer: Self-pay | Admitting: Student in an Organized Health Care Education/Training Program

## 2016-08-17 NOTE — Telephone Encounter (Signed)
Needs Omeprazole refilled, 20 mg.  [place].

## 2016-08-18 ENCOUNTER — Other Ambulatory Visit: Payer: Self-pay | Admitting: Student in an Organized Health Care Education/Training Program

## 2016-08-18 MED ORDER — OMEPRAZOLE 20 MG PO CPDR: 20.0000 mg | DELAYED_RELEASE_CAPSULE | Freq: Every day | ORAL | 2 refills | Status: DC

## 2016-08-18 NOTE — Telephone Encounter (Signed)
Filled patient's omeprazole as requested.

## 2016-09-02 ENCOUNTER — Emergency Department (HOSPITAL_COMMUNITY): Payer: Medicare Other

## 2016-09-02 ENCOUNTER — Emergency Department (HOSPITAL_COMMUNITY)
Admission: EM | Admit: 2016-09-02 | Discharge: 2016-09-02 | Disposition: A | Discharge status: Home / Self Care | Payer: Medicare Other | Attending: Emergency Medicine | Admitting: Emergency Medicine

## 2016-09-02 DIAGNOSIS — I10 Essential (primary) hypertension: Secondary | ICD-10-CM | POA: Insufficient documentation

## 2016-09-02 DIAGNOSIS — Z79899 Other long term (current) drug therapy: Secondary | ICD-10-CM | POA: Diagnosis not present

## 2016-09-02 DIAGNOSIS — R0789 Other chest pain: Secondary | ICD-10-CM | POA: Diagnosis not present

## 2016-09-02 DIAGNOSIS — R05 Cough: Secondary | ICD-10-CM | POA: Insufficient documentation

## 2016-09-02 DIAGNOSIS — J45909 Unspecified asthma, uncomplicated: Secondary | ICD-10-CM | POA: Insufficient documentation

## 2016-09-02 DIAGNOSIS — R059 Cough, unspecified: Secondary | ICD-10-CM

## 2016-09-02 DIAGNOSIS — F17210 Nicotine dependence, cigarettes, uncomplicated: Secondary | ICD-10-CM | POA: Insufficient documentation

## 2016-09-02 MED ORDER — BENZONATATE 100 MG PO CAPS: 100.0000 mg | ORAL_CAPSULE | Freq: Three times a day (TID) | ORAL | 0 refills | Status: DC
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
## 2016-09-02 NOTE — Discharge Instructions (Signed)
As discussed, your evaluation today has been largely reassuring.  But, it is important that you monitor your condition carefully, and do not hesitate to return to the ED if you develop new, or concerning changes in your condition. ? ?Otherwise, please follow-up with your physician for appropriate ongoing care. ? ?

## 2016-09-02 NOTE — ED Notes (Signed)
Dr.Lockwood at bedside  
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
## 2016-09-10 ENCOUNTER — Other Ambulatory Visit: Payer: Self-pay | Admitting: Student in an Organized Health Care Education/Training Program

## 2016-09-10 NOTE — Telephone Encounter (Signed)
sherry calling to request refill of:  Name of Medication(s):  lorazapam Last date of OV: 07-19-16 Pharmacy:  Pick up  Will route refill request to Clinic RN.  Discussed with patient policy to call pharmacy for future refills.  Also, discussed refills may take up to 48 hours to approve or deny.  Roseanna Rainbow

## 2016-09-12 ENCOUNTER — Telehealth: Payer: Self-pay | Admitting: Student in an Organized Health Care Education/Training Program

## 2016-09-12 MED ORDER — LORAZEPAM 0.5 MG PO TABS: 0.5000 mg | ORAL_TABLET | ORAL | 0 refills | Status: DC
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
## 2016-09-27 ENCOUNTER — Ambulatory Visit (INDEPENDENT_AMBULATORY_CARE_PROVIDER_SITE_OTHER): Payer: Medicare Other | Admitting: Student in an Organized Health Care Education/Training Program

## 2016-09-27 ENCOUNTER — Encounter: Payer: Self-pay | Admitting: Student in an Organized Health Care Education/Training Program

## 2016-09-27 DIAGNOSIS — Z9181 History of falling: Secondary | ICD-10-CM | POA: Diagnosis not present

## 2016-09-27 DIAGNOSIS — F6381 Intermittent explosive disorder: Secondary | ICD-10-CM | POA: Diagnosis not present

## 2016-09-27 NOTE — Patient Instructions (Addendum)
It was a pleasure seeing you today in our clinic. 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 Here is the treatment plan we have discussed and agreed upon together:  Plan: - Call [facility] or one of the other psychiatric places provided - schedule an appointment - we can no longer refill the medication you were taking for your outburst syndrome - Please schedule a visit with me to speak about other chronic medical conditions  Our clinic's number is [phone]. Please call with questions or concerns about what we discussed today.  Be well, Dr. Burr Medico

## 2016-09-27 NOTE — Progress Notes (Signed)
  HPI:  Stephen Buckley presents for ED follow up after fall and then another ED visit with complaints of chest pain that was thought to be in the setting of a respiratory infection. 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 His care is complicated by traumatic brain injury and poor memory.   Patient now reports his cough and chest discomfort have resolved. He endorses the fall was a mechanical fall in which he missed one stair. Overall he feels well.  He still reports he has not seen a psychiatrist. I have given him the information for [facility] on multiple occasions, however he has not yet made this call. I gave him a sheet with all of the psychiatry resources in the area today.   ROS: See HPI.  [place]: CC, smoking status reviewed  PHYSICAL EXAM: BP 138/80   Pulse 83   Temp 98.1 F (36.7 C) (Oral)   Ht 5\' 9"  (1.753 m)   Wt 87.5 kg (193 lb)   SpO2 96%   BMI 28.50 kg/m  Gen: NAD, comfortable, speaks slowly HEENT: NCAT, EOMI, MMM Heart: RRR, no m/r/g Lungs: CTA bil Neuro: CN II-XII grossly intact, patient speaks slowly, memory is poor, repeats himself Ext: no LE edem  ASSESSMENT/PLAN:  Episodic dyscontrol syndrome Provided resources once again for psych - patient understands I am unable to refill ativan - will reach out to [facility] to see if we can help coordinate psych care for this patient  History of fall Stable, no persistent symptoms   Everrett Coombe, MD, [place] PGY1 [place]

## 2016-09-27 NOTE — Assessment & Plan Note (Signed)
Stable, no persistent symptoms

## 2016-09-27 NOTE — Assessment & Plan Note (Signed)
Provided resources once again for psych - patient understands I am unable to refill ativan - will reach out to [facility] to see if we can help coordinate psych care for this patient

## 2016-10-04 ENCOUNTER — Ambulatory Visit (INDEPENDENT_AMBULATORY_CARE_PROVIDER_SITE_OTHER): Payer: Medicare Other | Admitting: *Deleted

## 2016-10-04 ENCOUNTER — Encounter: Payer: Self-pay | Admitting: *Deleted

## 2016-10-04 VITALS — BP 120/80 | HR 85 | Temp 98.3°F | Ht 69.0 in | Wt 190.4 lb

## 2016-10-04 DIAGNOSIS — Z0000 Encounter for general adult medical examination without abnormal findings: Secondary | ICD-10-CM

## 2016-10-04 DIAGNOSIS — Z114 Encounter for screening for human immunodeficiency virus [HIV]: Secondary | ICD-10-CM
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
## 2016-10-04 NOTE — Patient Instructions (Signed)
 Fall Prevention in the Home Falls can cause injuries. They can happen to people of all ages. There are many things you can do to make your home safe and to help prevent falls. What can I do on the outside of my home?  Regularly fix the edges of walkways and driveways and fix any cracks.  Remove anything that might make you trip as you walk through a door, such as a raised step or threshold.  Trim any bushes or trees on the path to your home.  Use bright outdoor lighting.  Clear any walking paths of anything that might make someone trip, such as rocks or tools.  Regularly check to see if handrails are loose or broken. Make sure that both sides of any steps have handrails.  Any raised decks and porches should have guardrails on the edges.  Have any leaves, snow, or ice cleared regularly.  Use sand or salt on walking paths during winter.  Clean up any spills in your garage right away. This includes oil or grease spills. What can I do in the bathroom?  Use night lights.  Install grab bars by the toilet and in the tub and shower. Do not use towel bars as grab bars.  Use non-skid mats or decals in the tub or shower.  If you need to sit down in the shower, use a plastic, non-slip stool.  Keep the floor dry. Clean up any water that spills on the floor as soon as it happens.  Remove soap buildup in the tub or shower regularly.  Attach bath mats securely with double-sided non-slip rug tape.  Do not have throw rugs and other things on the floor that can make you trip. What can I do in the bedroom?  Use night lights.  Make sure that you have a light by your bed that is easy to reach.  Do not use any sheets or blankets that are too big for your bed. They should not hang down onto the floor.  Have a firm chair that has side arms. You can use this for support while you get dressed.  Do not have throw rugs and other things on the floor that can make you trip. What can I do in  the kitchen?  Clean up any spills right away.  Avoid walking on wet floors.  Keep items that you use a lot in easy-to-reach places.  If you need to reach something above you, use a strong step stool that has a grab bar.  Keep electrical cords out of the way.  Do not use floor polish or wax that makes floors slippery. If you must use wax, use non-skid floor wax.  Do not have throw rugs and other things on the floor that can make you trip. What can I do with my stairs?  Do not leave any items on the stairs.  Make sure that there are handrails on both sides of the stairs and use them. Fix handrails that are broken or loose. Make sure that handrails are as long as the stairways.  Check any carpeting to make sure that it is firmly attached to the stairs. Fix any carpet that is loose or worn.  Avoid having throw rugs at the top or bottom of the stairs. If you do have throw rugs, attach them to the floor with carpet tape.  Make sure that you have a light switch at the top of the stairs and the bottom of the stairs. If you   do not have them, ask someone to add them for you. What else can I do to help prevent falls?  Wear shoes that:  Do not have high heels.  Have rubber bottoms.  Are comfortable and fit you well.  Are closed at the toe. Do not wear sandals.  If you use a stepladder:  Make sure that it is fully opened. Do not climb a closed stepladder.  Make sure that both sides of the stepladder are locked into place.  Ask someone to hold it for you, if possible.  Clearly mark and make sure that you can see:  Any grab bars or handrails.  First and last steps.  Where the edge of each step is.  Use tools that help you move around (mobility aids) if they are needed. These include:  Canes.  Walkers.  Scooters.  Crutches.  Turn on the lights when you go into a dark area. Replace any light bulbs as soon as they burn out.  Set up your furniture so you have a clear  path. Avoid moving your furniture around.  If any of your floors are uneven, fix them.  If there are any pets around you, be aware of where they are.  Review your medicines with your doctor. Some medicines can make you feel dizzy. This can increase your chance of falling. Ask your doctor what other things that you can do to help prevent falls. This information is not intended to replace advice given to you by your health care provider. Make sure you discuss any questions you have with your health care provider. Document Released: 04/07/2009 Document Revised: 11/17/2015 Document Reviewed: 07/16/2014 Elsevier Interactive Patient Education  2017 Elsevier Inc.   Health Maintenance, Male A healthy lifestyle and preventive care is important for your health and wellness. Ask your health care provider about what schedule of regular examinations is right for you. What should I know about weight and diet?  Eat a Healthy Diet  Eat plenty of vegetables, fruits, whole grains, low-fat dairy products, and lean protein.  Do not eat a lot of foods high in solid fats, added sugars, or salt. Maintain a Healthy Weight  Regular exercise can help you achieve or maintain a healthy weight. You should:  Do at least 150 minutes of exercise each week. The exercise should increase your heart rate and make you sweat (moderate-intensity exercise).  Do strength-training exercises at least twice a week. Watch Your Levels of Cholesterol and Blood Lipids  Have your blood tested for lipids and cholesterol every 5 years starting at 64 years of age. If you are at high risk for heart disease, you should start having your blood tested when you are 64 years old. You may need to have your cholesterol levels checked more often if:  Your lipid or cholesterol levels are high.  You are older than 64 years of age.  You are at high risk for heart disease. What should I know about cancer screening? Many types of cancers can  be detected early and may often be prevented. Lung Cancer  You should be screened every year for lung cancer if:  You are a current smoker who has smoked for at least 30 years.  You are a former smoker who has quit within the past 15 years.  Talk to your health care provider about your screening options, when you should start screening, and how often you should be screened. Colorectal Cancer  Routine colorectal cancer screening usually begins at 64 years   of age and should be repeated every 5-10 years until you are 64 years old. You may need to be screened more often if early forms of precancerous polyps or small growths are found. Your health care provider may recommend screening at an earlier age if you have risk factors for colon cancer.  Your health care provider may recommend using home test kits to check for hidden blood in the stool.  A small camera at the end of a tube can be used to examine your colon (sigmoidoscopy or colonoscopy). This checks for the earliest forms of colorectal cancer. Prostate and Testicular Cancer  Depending on your age and overall health, your health care provider may do certain tests to screen for prostate and testicular cancer.  Talk to your health care provider about any symptoms or concerns you have about testicular or prostate cancer. Skin Cancer  Check your skin from head to toe regularly.  Tell your health care provider about any new moles or changes in moles, especially if:  There is a change in a mole's size, shape, or color.  You have a mole that is larger than a pencil eraser.  Always use sunscreen. Apply sunscreen liberally and repeat throughout the day.  Protect yourself by wearing long sleeves, pants, a wide-brimmed hat, and sunglasses when outside. What should I know about heart disease, diabetes, and high blood pressure?  If you are 18-39 years of age, have your blood pressure checked every 3-5 years. If you are 40 years of age or  older, have your blood pressure checked every year. You should have your blood pressure measured twice-once when you are at a hospital or clinic, and once when you are not at a hospital or clinic. Record the average of the two measurements. To check your blood pressure when you are not at a hospital or clinic, you can use:  An automated blood pressure machine at a pharmacy.  A home blood pressure monitor.  Talk to your health care provider about your target blood pressure.  If you are between 45-79 years old, ask your health care provider if you should take aspirin to prevent heart disease.  Have regular diabetes screenings by checking your fasting blood sugar level.  If you are at a normal weight and have a low risk for diabetes, have this test once every three years after the age of 45.  If you are overweight and have a high risk for diabetes, consider being tested at a younger age or more often.  A one-time screening for abdominal aortic aneurysm (AAA) by ultrasound is recommended for men aged 65-75 years who are current or former smokers. What should I know about preventing infection? Hepatitis B  If you have a higher risk for hepatitis B, you should be screened for this virus. Talk with your health care provider to find out if you are at risk for hepatitis B infection. Hepatitis C  Blood testing is recommended for:  Everyone born from 1945 through 1965.  Anyone with known risk factors for hepatitis C. Sexually Transmitted Diseases (STDs)  You should be screened each year for STDs including gonorrhea and chlamydia if:  You are sexually active and are younger than 64 years of age.  You are older than 64 years of age and your health care provider tells you that you are at risk for this type of infection.  Your sexual activity has changed since you were last screened and you are at an increased risk for chlamydia   or gonorrhea. Ask your health care provider if you are at  risk.  Talk with your health care provider about whether you are at high risk of being infected with HIV. Your health care provider may recommend a prescription medicine to help prevent HIV infection. What else can I do?  Schedule regular health, dental, and eye exams.  Stay current with your vaccines (immunizations).  Do not use any tobacco products, such as cigarettes, chewing tobacco, and e-cigarettes. If you need help quitting, ask your health care provider.  Limit alcohol intake to no more than 2 drinks per day. One drink equals 12 ounces of beer, 5 ounces of wine, or 1 ounces of hard liquor.  Do not use street drugs.  Do not share needles.  Ask your health care provider for help if you need support or information about quitting drugs.  Tell your health care provider if you often feel depressed.  Tell your health care provider if you have ever been abused or do not feel safe at home. This information is not intended to replace advice given to you by your health care provider. Make sure you discuss any questions you have with your health care provider. Document Released: 12/08/2007 Document Revised: 02/08/2016 Document Reviewed: 03/15/2015 Elsevier Interactive Patient Education  2017 Elsevier Inc.   Steps to Quit Smoking Smoking tobacco can be bad for your health. It can also affect almost every organ in your body. Smoking puts you and people around you at risk for many serious long-lasting (chronic) diseases. Quitting smoking is hard, but it is one of the best things that you can do for your health. It is never too late to quit. What are the benefits of quitting smoking? When you quit smoking, you lower your risk for getting serious diseases and conditions. They can include:  Lung cancer or lung disease.  Heart disease.  Stroke.  Heart attack.  Not being able to have children (infertility).  Weak bones (osteoporosis) and broken bones (fractures). If you have  coughing, wheezing, and shortness of breath, those symptoms may get better when you quit. You may also get sick less often. If you are pregnant, quitting smoking can help to lower your chances of having a baby of low birth weight. What can I do to help me quit smoking? Talk with your doctor about what can help you quit smoking. Some things you can do (strategies) include:  Quitting smoking totally, instead of slowly cutting back how much you smoke over a period of time.  Going to in-person counseling. You are more likely to quit if you go to many counseling sessions.  Using resources and support systems, such as:  Online chats with a counselor.  Phone quitlines.  Printed self-help materials.  Support groups or group counseling.  Text messaging programs.  Mobile phone apps or applications.  Taking medicines. Some of these medicines may have nicotine in them. If you are pregnant or breastfeeding, do not take any medicines to quit smoking unless your doctor says it is okay. Talk with your doctor about counseling or other things that can help you. Talk with your doctor about using more than one strategy at the same time, such as taking medicines while you are also going to in-person counseling. This can help make quitting easier. What things can I do to make it easier to quit? Quitting smoking might feel very hard at first, but there is a lot that you can do to make it easier. Take these steps:    Talk to your family and friends. Ask them to support and encourage you.  Call phone quitlines, reach out to support groups, or work with a counselor.  Ask people who smoke to not smoke around you.  Avoid places that make you want (trigger) to smoke, such as:  Bars.  Parties.  Smoke-break areas at work.  Spend time with people who do not smoke.  Lower the stress in your life. Stress can make you want to smoke. Try these things to help your stress:  Getting regular  exercise.  Deep-breathing exercises.  Yoga.  Meditating.  Doing a body scan. To do this, close your eyes, focus on one area of your body at a time from head to toe, and notice which parts of your body are tense. Try to relax the muscles in those areas.  Download or buy apps on your mobile phone or tablet that can help you stick to your quit plan. There are many free apps, such as QuitGuide from the CDC (Centers for Disease Control and Prevention). You can find more support from smokefree.gov and other websites. This information is not intended to replace advice given to you by your health care provider. Make sure you discuss any questions you have with your health care provider. Document Released: 04/07/2009 Document Revised: 02/07/2016 Document Reviewed: 10/26/2014 Elsevier Interactive Patient Education  2017 Elsevier Inc.  

## 2016-10-05 LAB — HIV ANTIBODY (ROUTINE TESTING W REFLEX): HIV SCREEN 4TH GENERATION: NONREACTIVE

## 2016-10-06 NOTE — Progress Notes (Signed)
I have reviewed this visit and discussed with Howell Rucks, RN, BSN, and agree with her documentation.   Everrett Coombe, MD

## 2016-10-16 ENCOUNTER — Ambulatory Visit: Payer: Medicare Other | Admitting: Student in an Organized Health Care Education/Training Program

## 2016-10-22 ENCOUNTER — Other Ambulatory Visit: Payer: Self-pay | Admitting: *Deleted

## 2016-10-22 DIAGNOSIS — E785 Hyperlipidemia, unspecified: Secondary | ICD-10-CM

## 2016-10-23 MED ORDER — ATORVASTATIN CALCIUM 40 MG PO TABS
40.0000 mg | ORAL_TABLET | Freq: Every day | ORAL | 3 refills | Status: DC
Start: 2016-10-23 — End: 2016-10-29

## 2016-10-29 ENCOUNTER — Telehealth: Payer: Self-pay | Admitting: Student in an Organized Health Care Education/Training Program

## 2016-10-29 ENCOUNTER — Other Ambulatory Visit: Payer: Self-pay | Admitting: Student in an Organized Health Care Education/Training Program

## 2016-10-29 DIAGNOSIS — E785 Hyperlipidemia, unspecified: Secondary | ICD-10-CM

## 2016-10-29 DIAGNOSIS — F6381 Intermittent explosive disorder: Secondary | ICD-10-CM

## 2016-10-29 MED ORDER — ATORVASTATIN CALCIUM 40 MG PO TABS: 40.0000 mg | ORAL_TABLET | Freq: Every day | ORAL | 3 refills | Status: DC

## 2016-10-29 NOTE — Telephone Encounter (Signed)
Lipitor was sent to wrong pharmacy, please send it to [facility] [address] ep

## 2016-10-29 NOTE — Telephone Encounter (Signed)
pt calling to request refill of:  Name of Medication(s): seroquel  Last date of OV: 09-27-16  Pharmacy:  [facility]   Will route refill request to [facility].  Discussed with patient policy to call pharmacy for future refills.  Also, discussed refills may take up to 48 hours to approve or deny.  Roseanna Rainbow

## 2016-10-29 NOTE — Telephone Encounter (Signed)
Medication sent to [facility] per request.  Derl Barrow, RN

## 2016-10-29 NOTE — Telephone Encounter (Signed)
Wife wants PCP to call her to discuss instructions for all of pt's medications. ep

## 2016-10-31 ENCOUNTER — Ambulatory Visit (INDEPENDENT_AMBULATORY_CARE_PROVIDER_SITE_OTHER): Payer: Medicare Other | Admitting: Student in an Organized Health Care Education/Training Program

## 2016-10-31 ENCOUNTER — Encounter: Payer: Self-pay | Admitting: Student in an Organized Health Care Education/Training Program

## 2016-10-31 VITALS — BP 124/64 | HR 87 | Temp 98.3°F | Ht 69.0 in | Wt 190.0 lb

## 2016-10-31 DIAGNOSIS — S069X9S Unspecified intracranial injury with loss of consciousness of unspecified duration, sequela: Secondary | ICD-10-CM

## 2016-10-31 DIAGNOSIS — F6381 Intermittent explosive disorder: Secondary | ICD-10-CM

## 2016-10-31 DIAGNOSIS — R413 Other amnesia: Secondary | ICD-10-CM

## 2016-10-31 NOTE — Progress Notes (Signed)
CC: Med Management  HPI: Stephen Buckley is a 66 y.o. male with PMH significant for HTN, HLD, and TBI and memory loss who presents to [facility] today for medication management. I asked him to schedule this appointment to discuss chronic medical issues, because most of our visit end up being focused on his memory issues, and how he wants to continue Ativan, which has been tapered and discontinued.  Today, his wife is at bedside which is very helpful given that the patient has very poor memory and can never provide a reliable history. His wife notes that he does not take his medications as we prescribed. She finds this very concerning. She states that when she asks him to take his medications appropriately, he has outbursts in which he angrily cusses at her and becomes very mean, not violent. His memory problems make him forget when he has taken his medications. In particular, he is not taking his Seroquel each night, and he takes his carbamazepine intermittently, often once a day or not at all.   His wife becomes tearful while expressing that all she is trying to do his help him, and states that she ultimately thinks she will have to divorce him. One of her big challenges this year has been getting him a psychiatry appointment, which his wife endorses having on 5-17, next week. She states she will come him to the psychiatry appointment. Assured the wife that it is helpful to have additional history now that she accompanies patient to this visit. When offered a home health aide, she states "we have tried that in the past and it has not worked." Emotional, she then leaves the room.  The patient continues to endorse taking his medications appropriately, despite his wife's comments while she is in the room. He sits quietly and states, no I take them correctly. He has a notebook with his meds listed on it (they are listed correctly.)  Overdue health maintenance Colonoscopy  Review of Symptoms:  See HPI for  ROS.   CC, SH/smoking status, and VS noted.  Objective: BP 124/64   Pulse 87   Temp 98.3 F (36.8 C) (Oral)   Ht 5\' 9"  (1.753 m)   Wt 86.2 kg (190 lb)   SpO2 98%   BMI 28.06 kg/m  GEN: NAD, alert, cooperative, and pleasant. 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 Patient has carbamazepine on board, however based on his wife's history I am concerned that he is not taking this appropriately and cannot remember when he is taking it.  Would have thought it was prescribed as seizure prophylactic after TBI, however based on chart review looks as though it may have been prescribed for behavior. - discontinue carbamazepine, cannot have patient taking this inappropriately - will not taper as he only takes once per day or not at all per his wife - consult to neurology - pt to see psychiatry 5/17 per wife - concern about this patient's ability to function at home with only his wife to help. Does not seem that he would have capacity to make medical decisions. Would have liked to speak with wife alone however she left the room and the building before I could pull her aside, have called x2 to discuss with her but no  answer.   - Would like psychiatry to assess medical decision making capacity at visit next week. Patient may do better/ be safer in a group home/ALF?   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  Tapered Ativan and discontinued. Discontinued Carbamazepine this visit as noted.  Have had much difficulty getting patient in with  psych/behavioral health because he has to make the appointment and he always forgets/never makes it. Having wife at this visit has been helpful, asked that she accompanies him to future visits. - Continue seroquel - patient to see psych 5/17 - will follow up psych/behavioral health recs  Precepted this visit with Dr. Ree Kida  Orders Placed This Encounter  Procedures  . Ambulatory referral to Neurology    Referral Priority:   Routine    Referral Type:   Consultation    Referral Reason:   Specialty Services Required    Requested Specialty:   Neurology    Number of Visits Requested:   1    No orders of the defined types were placed in this encounter.    Everrett Coombe, MD,MS,  PGY1 11/01/2016 9:50 AM

## 2016-10-31 NOTE — Patient Instructions (Addendum)
It was a pleasure seeing you today in our clinic. Today we discussed Your medications. Here is the treatment plan we have discussed and agreed upon together:  - a list of your medications are attached to this sheet - please STOP taking carbamazepine - please follow up for your psychiatry appointment - A consult was placed to neurology at today's visit.  You will receive a call to schedule an appointment. If you do not receive a call within two weeks please call our office so we can place the consult again. - I will call your wife to discuss your care further   Our clinic's number is [phone]. Please call with questions or concerns about what we discussed today.  Be well, Dr. Burr Medico

## 2016-11-01 DIAGNOSIS — R413 Other amnesia: Secondary | ICD-10-CM | POA: Insufficient documentation

## 2016-11-01 DIAGNOSIS — F02818 Dementia in other diseases classified elsewhere, unspecified severity, with other behavioral disturbance: Secondary | ICD-10-CM | POA: Insufficient documentation

## 2016-11-01 NOTE — Assessment & Plan Note (Signed)
This is an ongoing problem for the patient, however with memory loss and poor med adherence (cannot remember when he has taken his meds) I have had to taper his meds this year out of concern that he will take too much.  Tapered Ativan and discontinued. Discontinued Carbamazepine this visit as noted.  Have had much difficulty getting patient in with psych/behavioral health because he has to make the appointment and he always forgets/never makes it. Having wife at this visit has been helpful, asked that she accompanies him to future visits. - Continue seroquel - patient to see psych 5/17 - will follow up psych/behavioral health recs

## 2016-11-01 NOTE — Telephone Encounter (Signed)
Called patient's wife x2, her number and the home number. Would like to have them ask psychiatry to assess decision making capacity when they go to  their appointment this week. LVM, will try again later this week.

## 2016-11-01 NOTE — Assessment & Plan Note (Signed)
Secondary to TBI. Patient has carbamazepine on board, however based on his wife's history I am concerned that he is not taking this appropriately and cannot remember when he is taking it.  Would have thought it was prescribed as seizure prophylactic after TBI, however based on chart review looks as though it may have been prescribed for behavior. - discontinue carbamazepine, cannot have patient taking this inappropriately - will not taper as he only takes once per day or not at all per his wife - consult to neurology - pt to see psychiatry 5/17 per wife - concern about this patient's ability to function at home with only his wife to help. Does not seem that he would have capacity to make medical decisions. Would have liked to speak with wife alone however she left the room and the building before I could pull her aside, have called x2 to discuss with her but no answer.   - Would like psychiatry to assess medical decision making capacity at visit next week. Patient may do better/ be safer in a group home/ALF?

## 2016-11-08 ENCOUNTER — Encounter (INDEPENDENT_AMBULATORY_CARE_PROVIDER_SITE_OTHER): Payer: Self-pay

## 2016-11-08 ENCOUNTER — Encounter (HOSPITAL_COMMUNITY): Payer: Self-pay | Admitting: Psychiatry

## 2016-11-08 ENCOUNTER — Ambulatory Visit (INDEPENDENT_AMBULATORY_CARE_PROVIDER_SITE_OTHER): Payer: Medicare Other | Admitting: Psychiatry

## 2016-11-08 VITALS — BP 126/84 | HR 83 | Ht 69.0 in | Wt 189.0 lb

## 2016-11-08 DIAGNOSIS — Z79899 Other long term (current) drug therapy: Secondary | ICD-10-CM | POA: Diagnosis not present

## 2016-11-08 DIAGNOSIS — F39 Unspecified mood [affective] disorder: Secondary | ICD-10-CM | POA: Diagnosis not present

## 2016-11-08 DIAGNOSIS — F17210 Nicotine dependence, cigarettes, uncomplicated: Secondary | ICD-10-CM | POA: Diagnosis not present

## 2016-11-08 DIAGNOSIS — F319 Bipolar disorder, unspecified: Secondary | ICD-10-CM

## 2016-11-08 DIAGNOSIS — Z811 Family history of alcohol abuse and dependence: Secondary | ICD-10-CM | POA: Diagnosis not present

## 2016-11-08 DIAGNOSIS — F6381 Intermittent explosive disorder: Secondary | ICD-10-CM | POA: Diagnosis not present

## 2016-11-08 MED ORDER — CARBAMAZEPINE 200 MG PO TABS: 200.0000 mg | ORAL_TABLET | Freq: Three times a day (TID) | ORAL | 0 refills | Status: DC

## 2016-11-08 MED ORDER — QUETIAPINE FUMARATE ER 150 MG PO TB24: 150.0000 mg | ORAL_TABLET | Freq: Every day | ORAL | 0 refills | Status: DC
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
## 2016-11-29 ENCOUNTER — Encounter: Payer: Self-pay | Admitting: Neurology

## 2016-11-29 ENCOUNTER — Ambulatory Visit (INDEPENDENT_AMBULATORY_CARE_PROVIDER_SITE_OTHER): Payer: Medicare Other | Admitting: Neurology

## 2016-11-29 VITALS — BP 135/88 | HR 76 | Ht 69.0 in | Wt 193.5 lb

## 2016-11-29 DIAGNOSIS — F319 Bipolar disorder, unspecified: Secondary | ICD-10-CM | POA: Diagnosis not present

## 2016-11-29 DIAGNOSIS — S069X9D Unspecified intracranial injury with loss of consciousness of unspecified duration, subsequent encounter: Secondary | ICD-10-CM | POA: Diagnosis not present

## 2016-11-29 DIAGNOSIS — G40909 Epilepsy, unspecified, not intractable, without status epilepticus: Secondary | ICD-10-CM | POA: Insufficient documentation

## 2016-11-29 HISTORY — DX: Epilepsy, unspecified, not intractable, without status epilepticus: G40.909

## 2016-11-29 MED ORDER — LORAZEPAM 0.5 MG PO TABS
0.5000 mg | ORAL_TABLET | Freq: Two times a day (BID) | ORAL | 3 refills | Status: DC | PRN
Start: 2016-11-29 — End: 2017-04-17

## 2016-11-29 MED ORDER — CARBAMAZEPINE 200 MG PO TABS: 300.0000 mg | ORAL_TABLET | Freq: Two times a day (BID) | ORAL | 0 refills | Status: DC

## 2016-11-29 NOTE — Patient Instructions (Signed)
   We will change the carbamazepine 200 mg tablet taking 1.5 tablets twice a day. Take morning and evening.
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
## 2016-12-04 ENCOUNTER — Ambulatory Visit (HOSPITAL_COMMUNITY): Payer: Self-pay | Admitting: Psychiatry

## 2016-12-19 ENCOUNTER — Ambulatory Visit (HOSPITAL_COMMUNITY): Payer: Self-pay | Admitting: Psychiatry

## 2016-12-25 ENCOUNTER — Encounter (HOSPITAL_COMMUNITY): Payer: Self-pay

## 2016-12-25 ENCOUNTER — Ambulatory Visit (HOSPITAL_COMMUNITY): Payer: Medicare Other | Admitting: Licensed Clinical Social Worker

## 2017-01-03 ENCOUNTER — Encounter (HOSPITAL_COMMUNITY): Payer: Self-pay | Admitting: Psychiatry

## 2017-01-03 ENCOUNTER — Ambulatory Visit (INDEPENDENT_AMBULATORY_CARE_PROVIDER_SITE_OTHER): Payer: Medicare Other | Admitting: Psychiatry

## 2017-01-03 VITALS — BP 130/78 | HR 83 | Ht 69.0 in | Wt 193.8 lb

## 2017-01-03 DIAGNOSIS — G4700 Insomnia, unspecified: Secondary | ICD-10-CM | POA: Diagnosis not present

## 2017-01-03 DIAGNOSIS — F319 Bipolar disorder, unspecified: Secondary | ICD-10-CM

## 2017-01-03 DIAGNOSIS — R413 Other amnesia: Secondary | ICD-10-CM | POA: Diagnosis not present

## 2017-01-03 DIAGNOSIS — Z811 Family history of alcohol abuse and dependence: Secondary | ICD-10-CM | POA: Diagnosis not present

## 2017-01-03 DIAGNOSIS — F17210 Nicotine dependence, cigarettes, uncomplicated: Secondary | ICD-10-CM | POA: Diagnosis not present

## 2017-01-03 MED ORDER — CARBAMAZEPINE 200 MG PO TABS: 300.0000 mg | ORAL_TABLET | Freq: Two times a day (BID) | ORAL | 1 refills | Status: DC

## 2017-01-03 MED ORDER — QUETIAPINE FUMARATE 200 MG PO TABS: 200.0000 mg | ORAL_TABLET | Freq: Every day | ORAL | 2 refills | Status: DC
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
## 2017-01-10 ENCOUNTER — Encounter (HOSPITAL_COMMUNITY): Payer: Self-pay | Admitting: Nurse Practitioner

## 2017-01-10 ENCOUNTER — Emergency Department (HOSPITAL_COMMUNITY): Payer: Medicare Other

## 2017-01-10 ENCOUNTER — Emergency Department (HOSPITAL_COMMUNITY)
Admission: EM | Admit: 2017-01-10 | Discharge: 2017-01-10 | Disposition: A | Discharge status: Home / Self Care | Payer: Medicare Other | Attending: Emergency Medicine | Admitting: Emergency Medicine

## 2017-01-10 DIAGNOSIS — F17210 Nicotine dependence, cigarettes, uncomplicated: Secondary | ICD-10-CM | POA: Insufficient documentation

## 2017-01-10 DIAGNOSIS — J45909 Unspecified asthma, uncomplicated: Secondary | ICD-10-CM | POA: Insufficient documentation

## 2017-01-10 DIAGNOSIS — R51 Headache: Secondary | ICD-10-CM | POA: Diagnosis not present

## 2017-01-10 DIAGNOSIS — Z79899 Other long term (current) drug therapy: Secondary | ICD-10-CM | POA: Insufficient documentation

## 2017-01-10 DIAGNOSIS — I10 Essential (primary) hypertension: Secondary | ICD-10-CM | POA: Insufficient documentation

## 2017-01-10 DIAGNOSIS — R519 Headache, unspecified: Secondary | ICD-10-CM

## 2017-01-10 MED ORDER — SODIUM CHLORIDE 0.9 % IV BOLUS (SEPSIS)
1000.0000 mL | Freq: Once | INTRAVENOUS | Status: AC
  Administered 2017-01-10: 1000 mL via INTRAVENOUS

## 2017-01-10 MED ORDER — DIPHENHYDRAMINE HCL 50 MG/ML IJ SOLN
25.0000 mg | Freq: Once | INTRAMUSCULAR | Status: AC
  Administered 2017-01-10: 25 mg via INTRAVENOUS
  Filled 2017-01-10: qty 1

## 2017-01-10 MED ORDER — KETOROLAC TROMETHAMINE 15 MG/ML IJ SOLN
15.0000 mg | Freq: Once | INTRAMUSCULAR | Status: AC
  Administered 2017-01-10: 15 mg via INTRAVENOUS
  Filled 2017-01-10: qty 1

## 2017-01-10 MED ORDER — METOCLOPRAMIDE HCL 5 MG/ML IJ SOLN
10.0000 mg | Freq: Once | INTRAMUSCULAR | Status: AC
  Administered 2017-01-10: 10 mg via INTRAVENOUS
  Filled 2017-01-10: qty 2
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
## 2017-01-10 NOTE — ED Provider Notes (Addendum)
I saw and evaluated the patient, reviewed the resident's note and I agree with the findings and plan.   EKG Interpretation None      66 year old male who presents with headache. He has a history of TBI. States that he has very infrequent and mild headaches associated with this since TBI over 2 years ago. He is not anticoagulated. States that over the past week he has had persistent sharp pain in the back of his head that radiates across to the front of his head. He has not tried any medications for his symptoms. Headache does not wake him up from sleep is not associated with nausea vomiting or any neurological deficits. Denies any fevers or chills. He does report that this is an atypical headache for him.  He is well-appearing. Vital signs are stable. He has no evidence of any logical deficits. He does have asymmetric pupils, left greater than right, but states that this is a chronic issue for him. CT head obtained given atypical nature of his headache. This is visualized and shows no acute intracranial processes. Suspect that headache is related to previous TBI. Headache is fully resolved after migraine cocktail. I discussed continued supportive care management for home.    Forde Dandy, MD 01/10/17 1807    Forde Dandy, MD 01/11/17 [phone]
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
## 2017-01-10 NOTE — ED Notes (Signed)
Pt to CT

## 2017-02-06 ENCOUNTER — Other Ambulatory Visit: Payer: Self-pay | Admitting: Student in an Organized Health Care Education/Training Program

## 2017-02-06 DIAGNOSIS — J302 Other seasonal allergic rhinitis: Secondary | ICD-10-CM

## 2017-02-11 ENCOUNTER — Ambulatory Visit (INDEPENDENT_AMBULATORY_CARE_PROVIDER_SITE_OTHER): Payer: Medicare Other | Admitting: Psychiatry

## 2017-02-11 ENCOUNTER — Encounter (HOSPITAL_COMMUNITY): Payer: Self-pay | Admitting: Psychiatry

## 2017-02-11 DIAGNOSIS — F319 Bipolar disorder, unspecified: Secondary | ICD-10-CM | POA: Diagnosis not present

## 2017-02-11 DIAGNOSIS — F17210 Nicotine dependence, cigarettes, uncomplicated: Secondary | ICD-10-CM

## 2017-02-11 DIAGNOSIS — S069X0A Unspecified intracranial injury without loss of consciousness, initial encounter: Secondary | ICD-10-CM

## 2017-02-11 DIAGNOSIS — Z811 Family history of alcohol abuse and dependence: Secondary | ICD-10-CM | POA: Diagnosis not present

## 2017-02-11 MED ORDER — QUETIAPINE FUMARATE 200 MG PO TABS: 200.0000 mg | ORAL_TABLET | Freq: Every day | ORAL | 2 refills | Status: DC
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
## 2017-03-09 ENCOUNTER — Other Ambulatory Visit (HOSPITAL_COMMUNITY): Payer: Self-pay | Admitting: Psychiatry

## 2017-03-09 DIAGNOSIS — F319 Bipolar disorder, unspecified: Secondary | ICD-10-CM

## 2017-03-11 ENCOUNTER — Other Ambulatory Visit (HOSPITAL_COMMUNITY): Payer: Self-pay | Admitting: Psychiatry

## 2017-03-11 DIAGNOSIS — F319 Bipolar disorder, unspecified: Secondary | ICD-10-CM

## 2017-03-12 ENCOUNTER — Other Ambulatory Visit (HOSPITAL_COMMUNITY): Payer: Self-pay

## 2017-03-12 DIAGNOSIS — F319 Bipolar disorder, unspecified: Secondary | ICD-10-CM

## 2017-03-12 MED ORDER — CARBAMAZEPINE 200 MG PO TABS: 300.0000 mg | ORAL_TABLET | Freq: Two times a day (BID) | ORAL | 2 refills | Status: DC

## 2017-04-01 ENCOUNTER — Encounter (INDEPENDENT_AMBULATORY_CARE_PROVIDER_SITE_OTHER): Payer: Self-pay

## 2017-04-01 ENCOUNTER — Encounter: Payer: Self-pay | Admitting: Adult Health

## 2017-04-01 ENCOUNTER — Ambulatory Visit (INDEPENDENT_AMBULATORY_CARE_PROVIDER_SITE_OTHER): Payer: Medicare Other | Admitting: Adult Health

## 2017-04-01 VITALS — BP 126/79 | HR 95 | Ht 69.0 in | Wt 202.6 lb

## 2017-04-01 DIAGNOSIS — R569 Unspecified convulsions: Secondary | ICD-10-CM | POA: Diagnosis not present

## 2017-04-01 DIAGNOSIS — Z87820 Personal history of traumatic brain injury: Secondary | ICD-10-CM

## 2017-04-01 DIAGNOSIS — Z5181 Encounter for therapeutic drug level monitoring: Secondary | ICD-10-CM | POA: Diagnosis not present

## 2017-04-01 NOTE — Patient Instructions (Signed)
Your Plan:  Continue Carbamazepine 300 mg twice a day Blood work today If your symptoms worsen or you develop new symptoms please let us know.   Thank you for coming to see [place] at [facility]. I hope we have been able to provide you high quality care today.  You may receive a patient satisfaction survey over the next few weeks. We would appreciate your feedback and comments so that we may continue to improve ourselves and the health of our patients.
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
## 2017-04-01 NOTE — Progress Notes (Signed)
I have read the note, and I agree with the clinical assessment and plan.  Rainie Crenshaw,Jevante KEITH   

## 2017-04-02 ENCOUNTER — Telehealth: Payer: Self-pay | Admitting: *Deleted

## 2017-04-02 LAB — CBC WITH DIFFERENTIAL/PLATELET
Basophils Absolute: 0 10*3/uL (ref 0.0–0.2)
Basos: 0 %
EOS (ABSOLUTE): 0.1 10*3/uL (ref 0.0–0.4)
Eos: 2 %
HEMOGLOBIN: 14.1 g/dL (ref 13.0–17.7)
Hematocrit: 42.6 % (ref 37.5–51.0)
IMMATURE GRANULOCYTES: 0 %
Immature Grans (Abs): 0 10*3/uL (ref 0.0–0.1)
Lymphocytes Absolute: 1.6 10*3/uL (ref 0.7–3.1)
Lymphs: 34 %
MCH: 30.7 pg (ref 26.6–33.0)
MCHC: 33.1 g/dL (ref 31.5–35.7)
MCV: 93 fL (ref 79–97)
MONOS ABS: 0.4 10*3/uL (ref 0.1–0.9)
Monocytes: 10 %
Neutrophils Absolute: 2.5 10*3/uL (ref 1.4–7.0)
Neutrophils: 54 %
Platelets: 183 10*3/uL (ref 150–379)
RBC: 4.59 x10E6/uL (ref 4.14–5.80)
RDW: 13.9 % (ref 12.3–15.4)
WBC: 4.5 10*3/uL (ref 3.4–10.8)

## 2017-04-02 LAB — COMPREHENSIVE METABOLIC PANEL
A/G RATIO: 1.8 (ref 1.2–2.2)
ALK PHOS: 91 IU/L (ref 39–117)
ALT: 42 IU/L (ref 0–44)
AST: 26 IU/L (ref 0–40)
Albumin: 4.5 g/dL (ref 3.6–4.8)
BILIRUBIN TOTAL: 0.3 mg/dL (ref 0.0–1.2)
BUN/Creatinine Ratio: 8 — ABNORMAL LOW (ref 10–24)
BUN: 7 mg/dL — ABNORMAL LOW (ref 8–27)
CHLORIDE: 106 mmol/L (ref 96–106)
CO2: 19 mmol/L — ABNORMAL LOW (ref 20–29)
Calcium: 9 mg/dL (ref 8.6–10.2)
Creatinine, Ser: 0.9 mg/dL (ref 0.76–1.27)
GFR calc non Af Amer: 90 mL/min/{1.73_m2} (ref >59)
GFR, EST AFRICAN AMERICAN: 104 mL/min/{1.73_m2} (ref >59)
Globulin, Total: 2.5 g/dL (ref 1.5–4.5)
Glucose: 151 mg/dL — ABNORMAL HIGH (ref 65–99)
POTASSIUM: 3.6 mmol/L (ref 3.5–5.2)
Sodium: 141 mmol/L (ref 134–144)
TOTAL PROTEIN: 7 g/dL (ref 6.0–8.5)

## 2017-04-02 LAB — CARBAMAZEPINE LEVEL, TOTAL: Carbamazepine (Tegretol), S: 11.3 ug/mL (ref 4.0–12.0)

## 2017-04-02 NOTE — Telephone Encounter (Signed)
Called patient, wasn't home. Spoke with wife [name], on [place]. She requested this RN give her lab results. Informed her that his labs are relatively unremarkable, nothing of any real concern per NP. Advised she have him call back if he has any questions. She verbalized understanding, appreciation.

## 2017-04-17 ENCOUNTER — Other Ambulatory Visit: Payer: Self-pay | Admitting: Neurology

## 2017-04-17 NOTE — Telephone Encounter (Signed)
Faxed printed/signed rx lorazepam to [facility], [place] at [phone]. Received fax confirmation.

## 2017-05-14 ENCOUNTER — Ambulatory Visit (HOSPITAL_COMMUNITY): Payer: Self-pay | Admitting: Psychiatry

## 2017-05-27 ENCOUNTER — Other Ambulatory Visit: Payer: Self-pay | Admitting: Student in an Organized Health Care Education/Training Program

## 2017-06-26 ENCOUNTER — Other Ambulatory Visit (HOSPITAL_COMMUNITY): Payer: Self-pay | Admitting: Psychiatry

## 2017-06-26 DIAGNOSIS — F319 Bipolar disorder, unspecified: Secondary | ICD-10-CM

## 2017-07-09 ENCOUNTER — Other Ambulatory Visit: Payer: Self-pay

## 2017-07-09 ENCOUNTER — Ambulatory Visit (INDEPENDENT_AMBULATORY_CARE_PROVIDER_SITE_OTHER): Payer: Medicare Other | Admitting: Student in an Organized Health Care Education/Training Program

## 2017-07-09 ENCOUNTER — Encounter: Payer: Self-pay | Admitting: Student in an Organized Health Care Education/Training Program

## 2017-07-09 DIAGNOSIS — J302 Other seasonal allergic rhinitis: Secondary | ICD-10-CM | POA: Diagnosis not present

## 2017-07-09 MED ORDER — FLUTICASONE PROPIONATE 50 MCG/ACT NA SUSP: 2.0000 | Freq: Every day | NASAL | 6 refills | Status: DC

## 2017-07-09 MED ORDER — CETIRIZINE HCL 10 MG PO TABS
10.0000 mg | ORAL_TABLET | Freq: Every day | ORAL | 0 refills | Status: DC
Start: 2017-07-09 — End: 2018-05-30

## 2017-07-09 NOTE — Progress Notes (Signed)
Date of Visit: 07/09/2017   CC: Nasal Congestion  HPI: Mr. Chappuis is a 67 year old male with PMH of TBI, seizure disorder, seasonal allergies, memory difficulty, and tobacco use who comes in with nasal congestion and difficulty breathing at night. He says that this has been going on for approximately one month. He has not had any fevers during this time. He denies wheezing, sore throat, headaches, and facial pain. He states that he has been intermittently using his Flonase nasal spray and regularly takes Cetrizine, as well as another over the counter medication that he cannot recall the name of.    ROS: See HPI.  [place]: Smoking status reviewed. 1/2 PPD. Living with wife.  PHYSICAL EXAM: BP 132/82   Pulse 76   Temp 98 F (36.7 C) (Oral)   Ht 5\' 9"  (1.753 m)   Wt 201 lb 9.6 oz (91.4 kg)   SpO2 96%   BMI 29.77 kg/m  Gen: Well appearing male in NAD. HEENT: Normocephalic, PERRLA, tympanic membranes clear bilaterally. Nasal turbinate swelling with dry mouth. No cervical lymphadenopathy, no maxillary tenderness. Heart: RRR, no murmurs, rubs or gallops. Lungs: CTAB, no wheezes or crackles. Neuro: No focal deficits.  ASSESSMENT/PLAN: Mr. Upchurch is a 67 year old male presenting for nasal congestion likely due to allergies. - Refilled cetirizine and Flonase and counseled on proper use  - Discussed use of neti-pot, hypoallergenic pillow cases, and saline spray for symptomatic relief.   Health maintenance:  -Patient has received flu shot this year - Patient is overdue for colonoscopy   FOLLOW UP: Follow up as needed. Dot Lanes, [place]

## 2017-07-09 NOTE — Patient Instructions (Addendum)
It was a pleasure seeing you today in our clinic. Today we discussed your allergies. Here is the treatment plan we have discussed and agreed upon together:  Your nasal spray and allergy medication were sent to your pharmacy.  Please follow up with psychiatry for your other medication refills.  Our clinic's number is [phone]. Please call with questions or concerns about what we discussed today.  Be well, Dr. Burr Medico

## 2017-07-09 NOTE — Progress Notes (Signed)
I have separately seen and examined the patient. I have discussed the findings and exam with the medical student and agree with the above note.    Additionally, 67 yo gentleman with memory difficulties s/p TBI presents with congestive symptoms consistent with allergies. Cetirizine and flonase refilled.  Physical Exam EYE: no conjunctival injection, pupils equally round and reactive to light ENMT: Normal tympanic light reflex, no nasal polyps,no rhinorrhea, no pharyngeal erythema or exudates NECK: Full ROM, no thyromegally RESPIRATORY: clear to auscultation bilaterally with no wheezes, rhonchi or rales, good effort CV: RRR, no m/r/g, no peripheral edema GI: Soft, non-tender, non-distended, normoactive bowel sounds, no hepatosplenomegaly SKIN: warm and dry, no rashes or lesions  Stephen Coombe, MD PGY-2 [facility]

## 2017-07-10 ENCOUNTER — Other Ambulatory Visit (HOSPITAL_COMMUNITY): Payer: Self-pay

## 2017-07-10 DIAGNOSIS — F319 Bipolar disorder, unspecified: Secondary | ICD-10-CM

## 2017-07-10 MED ORDER — CARBAMAZEPINE 200 MG PO TABS: 300.0000 mg | ORAL_TABLET | Freq: Two times a day (BID) | ORAL | 0 refills | Status: DC

## 2017-08-07 ENCOUNTER — Telehealth: Payer: Self-pay | Admitting: Student in an Organized Health Care Education/Training Program

## 2017-08-07 NOTE — Telephone Encounter (Signed)
Pt called and seemed to have some confusion about what Burr Medico told him at his last appt. He thought [place] made him an appt with [place], but he has no upcoming appts with [place]. He's trying to figure out who he is supposed to be seeing for an appt next. Please give him a call to clear this up

## 2017-08-08 NOTE — Telephone Encounter (Signed)
Based on AVS from his last visit it looks like he was advised to follow up with psychiatry at [facility].

## 2017-08-13 NOTE — Telephone Encounter (Signed)
Spoke to pt. He has an appt with [place]. Ottis Stain, CMA

## 2017-08-15 ENCOUNTER — Ambulatory Visit (INDEPENDENT_AMBULATORY_CARE_PROVIDER_SITE_OTHER): Payer: Medicare Other | Admitting: Psychiatry

## 2017-08-15 ENCOUNTER — Encounter (HOSPITAL_COMMUNITY): Payer: Self-pay | Admitting: Psychiatry

## 2017-08-15 DIAGNOSIS — F319 Bipolar disorder, unspecified: Secondary | ICD-10-CM

## 2017-08-15 DIAGNOSIS — F17210 Nicotine dependence, cigarettes, uncomplicated: Secondary | ICD-10-CM | POA: Diagnosis not present

## 2017-08-15 DIAGNOSIS — Z811 Family history of alcohol abuse and dependence: Secondary | ICD-10-CM | POA: Diagnosis not present

## 2017-08-15 MED ORDER — QUETIAPINE FUMARATE 200 MG PO TABS: 200.0000 mg | ORAL_TABLET | Freq: Every day | ORAL | 0 refills | Status: DC
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
## 2017-08-19 ENCOUNTER — Other Ambulatory Visit (HOSPITAL_COMMUNITY): Payer: Self-pay | Admitting: Psychiatry

## 2017-08-19 DIAGNOSIS — F319 Bipolar disorder, unspecified: Secondary | ICD-10-CM

## 2017-08-26 ENCOUNTER — Other Ambulatory Visit: Payer: Self-pay | Admitting: Neurology

## 2017-08-27 ENCOUNTER — Other Ambulatory Visit (HOSPITAL_COMMUNITY): Payer: Self-pay

## 2017-08-27 DIAGNOSIS — F319 Bipolar disorder, unspecified: Secondary | ICD-10-CM

## 2017-08-27 MED ORDER — CARBAMAZEPINE 200 MG PO TABS: 300.0000 mg | ORAL_TABLET | Freq: Two times a day (BID) | ORAL | 0 refills | Status: DC

## 2017-09-03 ENCOUNTER — Other Ambulatory Visit: Payer: Self-pay

## 2017-09-03 ENCOUNTER — Emergency Department (HOSPITAL_COMMUNITY): Payer: Medicare Other

## 2017-09-03 ENCOUNTER — Encounter (HOSPITAL_COMMUNITY): Payer: Self-pay | Admitting: Emergency Medicine

## 2017-09-03 ENCOUNTER — Inpatient Hospital Stay (HOSPITAL_COMMUNITY)
Admission: EM | Admit: 2017-09-03 | Discharge: 2017-09-06 | DRG: 063 | Disposition: A | Discharge status: Home / Self Care | Payer: Medicare Other | Attending: Neurology | Admitting: Neurology

## 2017-09-03 DIAGNOSIS — R278 Other lack of coordination: Secondary | ICD-10-CM | POA: Diagnosis not present

## 2017-09-03 DIAGNOSIS — G40909 Epilepsy, unspecified, not intractable, without status epilepticus: Secondary | ICD-10-CM

## 2017-09-03 DIAGNOSIS — S069X0S Unspecified intracranial injury without loss of consciousness, sequela: Secondary | ICD-10-CM | POA: Diagnosis not present

## 2017-09-03 DIAGNOSIS — R0602 Shortness of breath: Secondary | ICD-10-CM | POA: Diagnosis not present

## 2017-09-03 DIAGNOSIS — R29704 NIHSS score 4: Secondary | ICD-10-CM | POA: Diagnosis present

## 2017-09-03 DIAGNOSIS — F319 Bipolar disorder, unspecified: Secondary | ICD-10-CM | POA: Diagnosis present

## 2017-09-03 DIAGNOSIS — G9389 Other specified disorders of brain: Secondary | ICD-10-CM | POA: Diagnosis not present

## 2017-09-03 DIAGNOSIS — S06309S Unspecified focal traumatic brain injury with loss of consciousness of unspecified duration, sequela: Secondary | ICD-10-CM

## 2017-09-03 DIAGNOSIS — F17210 Nicotine dependence, cigarettes, uncomplicated: Secondary | ICD-10-CM | POA: Diagnosis present

## 2017-09-03 DIAGNOSIS — K5900 Constipation, unspecified: Secondary | ICD-10-CM | POA: Diagnosis not present

## 2017-09-03 DIAGNOSIS — R270 Ataxia, unspecified: Secondary | ICD-10-CM

## 2017-09-03 DIAGNOSIS — R413 Other amnesia: Secondary | ICD-10-CM | POA: Diagnosis not present

## 2017-09-03 DIAGNOSIS — S0990XS Unspecified injury of head, sequela: Secondary | ICD-10-CM | POA: Diagnosis not present

## 2017-09-03 DIAGNOSIS — M47812 Spondylosis without myelopathy or radiculopathy, cervical region: Secondary | ICD-10-CM | POA: Diagnosis present

## 2017-09-03 DIAGNOSIS — Z87898 Personal history of other specified conditions: Secondary | ICD-10-CM | POA: Diagnosis not present

## 2017-09-03 DIAGNOSIS — S0291XS Unspecified fracture of skull, sequela: Secondary | ICD-10-CM | POA: Diagnosis not present

## 2017-09-03 DIAGNOSIS — S069X9A Unspecified intracranial injury with loss of consciousness of unspecified duration, initial encounter: Secondary | ICD-10-CM | POA: Diagnosis present

## 2017-09-03 DIAGNOSIS — F0280 Dementia in other diseases classified elsewhere without behavioral disturbance: Secondary | ICD-10-CM | POA: Diagnosis present

## 2017-09-03 DIAGNOSIS — S069XAA Unspecified intracranial injury with loss of consciousness status unknown, initial encounter: Secondary | ICD-10-CM | POA: Diagnosis present

## 2017-09-03 DIAGNOSIS — R42 Dizziness and giddiness: Secondary | ICD-10-CM | POA: Diagnosis not present

## 2017-09-03 DIAGNOSIS — Z6830 Body mass index (BMI) 30.0-30.9, adult: Secondary | ICD-10-CM

## 2017-09-03 DIAGNOSIS — R4689 Other symptoms and signs involving appearance and behavior: Secondary | ICD-10-CM

## 2017-09-03 DIAGNOSIS — R2990 Unspecified symptoms and signs involving the nervous system: Secondary | ICD-10-CM | POA: Diagnosis not present

## 2017-09-03 DIAGNOSIS — I6522 Occlusion and stenosis of left carotid artery: Secondary | ICD-10-CM | POA: Diagnosis not present

## 2017-09-03 DIAGNOSIS — F02818 Dementia in other diseases classified elsewhere, unspecified severity, with other behavioral disturbance: Secondary | ICD-10-CM | POA: Diagnosis present

## 2017-09-03 DIAGNOSIS — I10 Essential (primary) hypertension: Secondary | ICD-10-CM | POA: Diagnosis present

## 2017-09-03 DIAGNOSIS — R4781 Slurred speech: Secondary | ICD-10-CM | POA: Diagnosis not present

## 2017-09-03 DIAGNOSIS — I639 Cerebral infarction, unspecified: Secondary | ICD-10-CM | POA: Diagnosis not present

## 2017-09-03 DIAGNOSIS — G459 Transient cerebral ischemic attack, unspecified: Secondary | ICD-10-CM

## 2017-09-03 DIAGNOSIS — R471 Dysarthria and anarthria: Secondary | ICD-10-CM | POA: Diagnosis not present

## 2017-09-03 DIAGNOSIS — E785 Hyperlipidemia, unspecified: Secondary | ICD-10-CM | POA: Diagnosis not present

## 2017-09-03 DIAGNOSIS — K219 Gastro-esophageal reflux disease without esophagitis: Secondary | ICD-10-CM | POA: Diagnosis present

## 2017-09-03 DIAGNOSIS — E669 Obesity, unspecified: Secondary | ICD-10-CM | POA: Diagnosis present

## 2017-09-03 DIAGNOSIS — S069X9D Unspecified intracranial injury with loss of consciousness of unspecified duration, subsequent encounter: Secondary | ICD-10-CM | POA: Diagnosis not present

## 2017-09-03 DIAGNOSIS — I6789 Other cerebrovascular disease: Secondary | ICD-10-CM | POA: Diagnosis not present

## 2017-09-03 DIAGNOSIS — R269 Unspecified abnormalities of gait and mobility: Secondary | ICD-10-CM | POA: Diagnosis not present

## 2017-09-03 DIAGNOSIS — S069X9S Unspecified intracranial injury with loss of consciousness of unspecified duration, sequela: Secondary | ICD-10-CM | POA: Diagnosis not present

## 2017-09-03 DIAGNOSIS — R29818 Other symptoms and signs involving the nervous system: Secondary | ICD-10-CM | POA: Diagnosis not present

## 2017-09-03 LAB — DIFFERENTIAL
BASOS ABS: 0 10*3/uL (ref 0.0–0.1)
Basophils Relative: 0 %
Eosinophils Absolute: 0.1 10*3/uL (ref 0.0–0.7)
Eosinophils Relative: 2 %
LYMPHS ABS: 1.8 10*3/uL (ref 0.7–4.0)
Lymphocytes Relative: 37 %
MONO ABS: 0.4 10*3/uL (ref 0.1–1.0)
MONOS PCT: 9 %
Neutro Abs: 2.5 10*3/uL (ref 1.7–7.7)
Neutrophils Relative %: 52 %

## 2017-09-03 LAB — I-STAT CHEM 8, ED
BUN: 7 mg/dL (ref 6–20)
Calcium, Ion: 1.14 mmol/L — ABNORMAL LOW (ref 1.15–1.40)
Chloride: 108 mmol/L (ref 101–111)
Creatinine, Ser: 0.9 mg/dL (ref 0.61–1.24)
GLUCOSE: 101 mg/dL — AB (ref 65–99)
HCT: 43 % (ref 39.0–52.0)
HEMOGLOBIN: 14.6 g/dL (ref 13.0–17.0)
Potassium: 3.6 mmol/L (ref 3.5–5.1)
Sodium: 144 mmol/L (ref 135–145)
TCO2: 23 mmol/L (ref 22–32)

## 2017-09-03 LAB — APTT: APTT: 33 s (ref 24–36)

## 2017-09-03 LAB — COMPREHENSIVE METABOLIC PANEL
ALT: 29 U/L (ref 17–63)
AST: 24 U/L (ref 15–41)
Albumin: 4 g/dL (ref 3.5–5.0)
Alkaline Phosphatase: 71 U/L (ref 38–126)
Anion gap: 8 (ref 5–15)
BILIRUBIN TOTAL: 0.5 mg/dL (ref 0.3–1.2)
BUN: 7 mg/dL (ref 6–20)
CO2: 21 mmol/L — ABNORMAL LOW (ref 22–32)
CREATININE: 0.94 mg/dL (ref 0.61–1.24)
Calcium: 8.8 mg/dL — ABNORMAL LOW (ref 8.9–10.3)
Chloride: 112 mmol/L — ABNORMAL HIGH (ref 101–111)
Glucose, Bld: 104 mg/dL — ABNORMAL HIGH (ref 65–99)
POTASSIUM: 3.7 mmol/L (ref 3.5–5.1)
Sodium: 141 mmol/L (ref 135–145)
TOTAL PROTEIN: 6.4 g/dL — AB (ref 6.5–8.1)

## 2017-09-03 LAB — CBC
HEMATOCRIT: 42.2 % (ref 39.0–52.0)
HEMOGLOBIN: 14.1 g/dL (ref 13.0–17.0)
MCH: 30.5 pg (ref 26.0–34.0)
MCHC: 33.4 g/dL (ref 30.0–36.0)
MCV: 91.1 fL (ref 78.0–100.0)
Platelets: 165 10*3/uL (ref 150–400)
RBC: 4.63 MIL/uL (ref 4.22–5.81)
RDW: 13.6 % (ref 11.5–15.5)
WBC: 4.8 10*3/uL (ref 4.0–10.5)

## 2017-09-03 LAB — I-STAT TROPONIN, ED: TROPONIN I, POC: 0 ng/mL (ref 0.00–0.08)

## 2017-09-03 LAB — MRSA PCR SCREENING: MRSA by PCR: NEGATIVE

## 2017-09-03 LAB — PROTIME-INR
INR: 1.13
Prothrombin Time: 14.4 seconds (ref 11.4–15.2)

## 2017-09-03 MED ORDER — PNEUMOCOCCAL VAC POLYVALENT 25 MCG/0.5ML IJ INJ
0.5000 mL | INJECTION | INTRAMUSCULAR | Status: DC
  Filled 2017-09-03: qty 0.5

## 2017-09-03 MED ORDER — QUETIAPINE FUMARATE 50 MG PO TABS
200.0000 mg | ORAL_TABLET | Freq: Every day | ORAL | Status: DC
  Administered 2017-09-03 – 2017-09-05 (×3): 200 mg via ORAL
  Filled 2017-09-03: qty 4
  Filled 2017-09-03 (×2): qty 1

## 2017-09-03 MED ORDER — ACETAMINOPHEN 650 MG RE SUPP: 650.0000 mg | RECTAL | Status: DC | PRN

## 2017-09-03 MED ORDER — PANTOPRAZOLE SODIUM 40 MG IV SOLR
40.0000 mg | Freq: Every day | INTRAVENOUS | Status: DC
  Administered 2017-09-03: 40 mg via INTRAVENOUS
  Filled 2017-09-03: qty 40

## 2017-09-03 MED ORDER — ACETAMINOPHEN 325 MG PO TABS: 650.0000 mg | ORAL_TABLET | ORAL | Status: DC | PRN

## 2017-09-03 MED ORDER — ACETAMINOPHEN 160 MG/5ML PO SOLN: 650.0000 mg | ORAL | Status: DC | PRN

## 2017-09-03 MED ORDER — ALTEPLASE (STROKE) FULL DOSE INFUSION
0.9000 mg/kg | Freq: Once | INTRAVENOUS | Status: AC
  Administered 2017-09-03: 80 mg via INTRAVENOUS
  Filled 2017-09-03: qty 100

## 2017-09-03 MED ORDER — SODIUM CHLORIDE 0.9 % IV SOLN
INTRAVENOUS | Status: DC
  Administered 2017-09-03: 16:00:00 via INTRAVENOUS

## 2017-09-03 MED ORDER — INFLUENZA VAC SPLIT QUAD 0.5 ML IM SUSY
0.5000 mL | PREFILLED_SYRINGE | INTRAMUSCULAR | Status: DC
Start: 2017-09-04 — End: 2017-09-06
  Filled 2017-09-03: qty 0.5

## 2017-09-03 MED ORDER — CARBAMAZEPINE 200 MG PO TABS
300.0000 mg | ORAL_TABLET | Freq: Two times a day (BID) | ORAL | Status: DC
  Administered 2017-09-03 – 2017-09-04 (×2): 300 mg via ORAL
  Filled 2017-09-03 (×3): qty 1.5

## 2017-09-03 MED ORDER — IOPAMIDOL (ISOVUE-370) INJECTION 76%
INTRAVENOUS | Status: AC
  Administered 2017-09-03: 50 mL
  Filled 2017-09-03: qty 50

## 2017-09-03 MED ORDER — STROKE: EARLY STAGES OF RECOVERY BOOK
Freq: Once | Status: AC
  Administered 2017-09-05: 15:00:00
  Filled 2017-09-03 (×2): qty 1

## 2017-09-03 NOTE — ED Notes (Signed)
Janett Billow RN and Advertising copywriter at bedside [facility] performed.

## 2017-09-03 NOTE — ED Notes (Signed)
Patient transported to MRI.  Called 4N and advised MRI is taking patient at this time and will be transported to floor when pt returns back to ED.

## 2017-09-03 NOTE — ED Notes (Signed)
Pt returned from MRI, not done d/t timed for 12am

## 2017-09-03 NOTE — ED Notes (Signed)
Dr,. Cheral Marker paged for diet order as pt passed swallow screen.
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
## 2017-09-03 NOTE — ED Notes (Signed)
6H20 ready for patient now.
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
## 2017-09-03 NOTE — Code Documentation (Addendum)
67 yo male coming from home with complaints of abnormal speech and trouble walking. Pt was left by wife at 1300 at his baseline. When wife returned to the house at 1320, pt was sitting on the side of the bed talking abnormally and unable to walk without assistance. Wife called EMS and patient was transferred by [facility]. Stroke called in the field. Stroke Team and Neurology met patient in the arrival to the ED. NIHSS 3. Pt noted to have mild right sided facial droop, bilateral arm and left leg ataxia, and slurred speech. Pt reports slurred speech at baseline when he does not have his teeth in. CTA completed. Some delay noted to patient treatment due to pt indecision and tPA started at 1441. Pt care handed of with [place], ED RN and pt to be admitted to ICU.

## 2017-09-03 NOTE — Progress Notes (Signed)
Pharmacist Code Stroke Response  Notified to mix tPA at 1428 by Dr. Cheral Marker Delivered tPA to RN at 1433  Issues/delays encountered (if applicable): pt undecided on getting medicine initially  Stephen Buckley 09/03/17 2:29 PM

## 2017-09-03 NOTE — ED Triage Notes (Signed)
Pt here with c/o stroke , wife found pt with slurred speech and unable to use his legs , pt arrived as a code stroke

## 2017-09-04 ENCOUNTER — Inpatient Hospital Stay (HOSPITAL_COMMUNITY): Payer: Medicare Other

## 2017-09-04 ENCOUNTER — Encounter (HOSPITAL_COMMUNITY): Payer: Self-pay | Admitting: Radiology

## 2017-09-04 DIAGNOSIS — G459 Transient cerebral ischemic attack, unspecified: Secondary | ICD-10-CM

## 2017-09-04 DIAGNOSIS — Z87898 Personal history of other specified conditions: Secondary | ICD-10-CM

## 2017-09-04 DIAGNOSIS — F319 Bipolar disorder, unspecified: Secondary | ICD-10-CM

## 2017-09-04 DIAGNOSIS — I10 Essential (primary) hypertension: Secondary | ICD-10-CM

## 2017-09-04 DIAGNOSIS — E785 Hyperlipidemia, unspecified: Secondary | ICD-10-CM

## 2017-09-04 LAB — BASIC METABOLIC PANEL
ANION GAP: 7 (ref 5–15)
BUN: 7 mg/dL (ref 6–20)
CALCIUM: 8.3 mg/dL — AB (ref 8.9–10.3)
CHLORIDE: 111 mmol/L (ref 101–111)
CO2: 22 mmol/L (ref 22–32)
Creatinine, Ser: 0.89 mg/dL (ref 0.61–1.24)
GFR calc non Af Amer: >60 mL/min (ref >60)
Glucose, Bld: 90 mg/dL (ref 65–99)
Potassium: 4 mmol/L (ref 3.5–5.1)
Sodium: 140 mmol/L (ref 135–145)

## 2017-09-04 LAB — CBC
HEMATOCRIT: 38.8 % — AB (ref 39.0–52.0)
HEMOGLOBIN: 12.8 g/dL — AB (ref 13.0–17.0)
MCH: 29.8 pg (ref 26.0–34.0)
MCHC: 33 g/dL (ref 30.0–36.0)
MCV: 90.4 fL (ref 78.0–100.0)
Platelets: 150 10*3/uL (ref 150–400)
RBC: 4.29 MIL/uL (ref 4.22–5.81)
RDW: 13.5 % (ref 11.5–15.5)
WBC: 3.1 10*3/uL — AB (ref 4.0–10.5)

## 2017-09-04 LAB — RAPID URINE DRUG SCREEN, HOSP PERFORMED
AMPHETAMINES: NOT DETECTED
Barbiturates: NOT DETECTED
Benzodiazepines: NOT DETECTED
COCAINE: NOT DETECTED
OPIATES: NOT DETECTED
TETRAHYDROCANNABINOL: NOT DETECTED

## 2017-09-04 LAB — ECHOCARDIOGRAM COMPLETE
Height: 66 in
WEIGHTICAEL: 2998.26 [oz_av]

## 2017-09-04 LAB — LIPID PANEL
CHOL/HDL RATIO: 5.1 ratio
CHOLESTEROL: 101 mg/dL (ref 0–200)
HDL: 20 mg/dL — AB (ref >40)
LDL Cholesterol: 66 mg/dL (ref 0–99)
Triglycerides: 76 mg/dL (ref <150)
VLDL: 15 mg/dL (ref 0–40)

## 2017-09-04 LAB — HEMOGLOBIN A1C
HEMOGLOBIN A1C: 6 % — AB (ref 4.8–5.6)
MEAN PLASMA GLUCOSE: 125.5 mg/dL

## 2017-09-04 LAB — CARBAMAZEPINE LEVEL, TOTAL: Carbamazepine Lvl: 13.7 ug/mL — ABNORMAL HIGH (ref 4.0–12.0)

## 2017-09-04 MED ORDER — CARBAMAZEPINE 200 MG PO TABS
300.0000 mg | ORAL_TABLET | Freq: Every day | ORAL | Status: DC
  Administered 2017-09-04 – 2017-09-05 (×2): 300 mg via ORAL
  Filled 2017-09-04 (×4): qty 1.5

## 2017-09-04 MED ORDER — HALOPERIDOL LACTATE 5 MG/ML IJ SOLN
5.0000 mg | Freq: Once | INTRAMUSCULAR | Status: AC
  Administered 2017-09-04: 5 mg via INTRAVENOUS

## 2017-09-04 MED ORDER — FLUTICASONE PROPIONATE 50 MCG/ACT NA SUSP
2.0000 | Freq: Every day | NASAL | Status: DC
  Administered 2017-09-04 – 2017-09-05 (×2): 2 via NASAL
  Filled 2017-09-04 (×2): qty 16

## 2017-09-04 MED ORDER — LORAZEPAM 2 MG/ML IJ SOLN
2.0000 mg | Freq: Once | INTRAMUSCULAR | Status: AC
  Administered 2017-09-04: 2 mg via INTRAVENOUS
  Filled 2017-09-04: qty 1

## 2017-09-04 MED ORDER — HALOPERIDOL LACTATE 5 MG/ML IJ SOLN
INTRAMUSCULAR | Status: AC
  Filled 2017-09-04: qty 1

## 2017-09-04 MED ORDER — LORATADINE 10 MG PO TABS
10.0000 mg | ORAL_TABLET | Freq: Every day | ORAL | Status: DC
  Administered 2017-09-04 – 2017-09-06 (×3): 10 mg via ORAL
  Filled 2017-09-04 (×3): qty 1

## 2017-09-04 MED ORDER — CARBAMAZEPINE 200 MG PO TABS
200.0000 mg | ORAL_TABLET | Freq: Every day | ORAL | Status: DC
  Administered 2017-09-05 – 2017-09-06 (×2): 200 mg via ORAL
  Filled 2017-09-04 (×3): qty 1

## 2017-09-04 MED ORDER — PANTOPRAZOLE SODIUM 40 MG PO TBEC
40.0000 mg | DELAYED_RELEASE_TABLET | Freq: Every day | ORAL | Status: DC
  Administered 2017-09-04 – 2017-09-06 (×3): 40 mg via ORAL
  Filled 2017-09-04 (×3): qty 1

## 2017-09-04 MED ORDER — ASPIRIN EC 325 MG PO TBEC
325.0000 mg | DELAYED_RELEASE_TABLET | Freq: Every day | ORAL | Status: DC
  Administered 2017-09-04 – 2017-09-06 (×3): 325 mg via ORAL
  Filled 2017-09-04 (×3): qty 1

## 2017-09-04 MED ORDER — ATORVASTATIN CALCIUM 40 MG PO TABS
40.0000 mg | ORAL_TABLET | Freq: Every day | ORAL | Status: DC
  Administered 2017-09-04 – 2017-09-05 (×2): 40 mg via ORAL
  Filled 2017-09-04 (×2): qty 1

## 2017-09-04 MED ORDER — LORAZEPAM 2 MG/ML IJ SOLN
1.0000 mg | Freq: Once | INTRAMUSCULAR | Status: AC
  Administered 2017-09-04: 1 mg via INTRAVENOUS

## 2017-09-04 MED ORDER — LORAZEPAM 2 MG/ML IJ SOLN
INTRAMUSCULAR | Status: AC
  Filled 2017-09-04: qty 1

## 2017-09-04 NOTE — Plan of Care (Signed)
Pt has tolerated an increase in activity and participated with PT/OT today

## 2017-09-04 NOTE — Progress Notes (Signed)
STROKE TEAM PROGRESS NOTE   SUBJECTIVE (INTERVAL HISTORY) His wife is at the bedside.  Overall he feels his condition is gradually improving. He recounted HPI with me. He was doing house work and then became dizzy and difficult walking. After tPA, this morning he felt improved but not sure if he can walk by himself. PT/OT pending. MRI pending.    OBJECTIVE Temp:  [98 F (36.7 C)-98.7 F (37.1 C)] 98.5 F (36.9 C) (03/13 1200) Pulse Rate:  [77-89] 82 (03/13 1200) Cardiac Rhythm: Normal sinus rhythm (03/13 0800) Resp:  [11-29] 20 (03/13 1200) BP: (101-174)/(56-104) 128/76 (03/13 1200) SpO2:  [92 %-99 %] 95 % (03/13 1200) Weight:  [187 lb 6.3 oz (85 kg)] 187 lb 6.3 oz (85 kg) (03/12 2000)  No results for input(s): GLUCAP in the last 168 hours. Recent Labs  Lab 09/03/17 1430 09/03/17 1432 09/04/17 0704  NA 141 144 140  K 3.7 3.6 4.0  CL 112* 108 111  CO2 21*  --  22  GLUCOSE 104* 101* 90  BUN 7 7 7   CREATININE 0.94 0.90 0.89  CALCIUM 8.8*  --  8.3*   Recent Labs  Lab 09/03/17 1430  AST 24  ALT 29  ALKPHOS 71  BILITOT 0.5  PROT 6.4*  ALBUMIN 4.0   Recent Labs  Lab 09/03/17 1430 09/03/17 1432 09/04/17 0704  WBC 4.8  --  3.1*  NEUTROABS 2.5  --   --   HGB 14.1 14.6 12.8*  HCT 42.2 43.0 38.8*  MCV 91.1  --  90.4  PLT 165  --  150   No results for input(s): CKTOTAL, CKMB, CKMBINDEX, TROPONINI in the last 168 hours. Recent Labs    09/03/17 1430  LABPROT 14.4  INR 1.13   No results for input(s): COLORURINE, LABSPEC, PHURINE, GLUCOSEU, HGBUR, BILIRUBINUR, KETONESUR, PROTEINUR, UROBILINOGEN, NITRITE, LEUKOCYTESUR in the last 72 hours.  Invalid input(s): APPERANCEUR     Component Value Date/Time   CHOL 101 09/04/2017 0346   TRIG 76 09/04/2017 0346   HDL 20 (L) 09/04/2017 0346   CHOLHDL 5.1 09/04/2017 0346   VLDL 15 09/04/2017 0346   LDLCALC 66 09/04/2017 0346   Lab Results  Component Value Date   HGBA1C 6.0 (H) 09/04/2017      Component Value Date/Time    LABOPIA NONE DETECTED 09/04/2017 1102   COCAINSCRNUR NONE DETECTED 09/04/2017 1102   LABBENZ NONE DETECTED 09/04/2017 1102   AMPHETMU NONE DETECTED 09/04/2017 1102   THCU NONE DETECTED 09/04/2017 1102   LABBARB NONE DETECTED 09/04/2017 1102    No results for input(s): ETH in the last 168 hours.  I have personally reviewed the radiological images below and agree with the radiology interpretations.  Ct Angio Head W Or Wo Contrast  Result Date: 09/03/2017 CLINICAL DATA:  Code stroke. Bilateral finger ataxia. Slurred speech. EXAM: CT ANGIOGRAPHY HEAD AND NECK TECHNIQUE: Multidetector CT imaging of the head and neck was performed using the standard protocol during bolus administration of intravenous contrast. Multiplanar CT image reconstructions and MIPs were obtained to evaluate the vascular anatomy. Carotid stenosis measurements (when applicable) are obtained utilizing NASCET criteria, using the distal internal carotid diameter as the denominator. CONTRAST:  56mL ISOVUE-370 IOPAMIDOL (ISOVUE-370) INJECTION 76% COMPARISON:  CT head without contrast 09/03/2017. FINDINGS: CTA NECK FINDINGS Aortic arch: There is a common origin of the left common carotid artery and the innominate artery. Atherosclerotic calcifications are present at the aortic arch without significant stenosis or aneurysm. There is mild narrowing of the  left common carotid artery proximally, less than 50%. Right carotid system: The right common carotid artery is within normal limits. Atherosclerotic calcifications are present at the right carotid bifurcation without a significant stenosis relative to the more distal vessel. Left carotid system: The left common carotid artery is otherwise within normal limits. Atherosclerotic calcifications are present at the left carotid bifurcation without a significant stenosis relative to the more distal vessel. Vertebral arteries: The vertebral arteries originate from the subclavian arteries bilaterally  without a significant stenosis. The right vertebral artery is slightly larger than the left. There is no significant stenosis of either vertebral artery in the neck. Skeleton: Chronic endplate degenerative changes are present C5-6 and C6-7. Marrow changes are most evident at C6 and C7. Osseous foraminal narrowing is greatest at C5-6 and C6-7, right greater than left the patient is edentulous. No focal lytic or blastic lesions are present. Other neck: Soft tissues the neck are otherwise unremarkable. No focal mucosal or submucosal lesions are present. The salivary glands are within normal limits bilaterally. No significant cervical adenopathy is present. Upper chest: Mild dependent atelectasis is present. The lungs are otherwise clear. The thoracic inlet is within normal limits. Review of the MIP images confirms the above findings CTA HEAD FINDINGS Anterior circulation: Atherosclerotic calcifications are present within the cavernous internal carotid arteries bilaterally without a significant stenosis relative to the more distal vessel. The ICA termini are normal. A1 and M1 segments are normal. The anterior communicating artery is patent. MCA bifurcations are intact. ACA and MCA branch vessels are unremarkable. Posterior circulation: The left PICA origin is visualized and normal. The right AICA is dominant. The basilar artery is normal. Both posterior cerebral arteries originate from basilar tip. Moderate stenoses are present in the distal P2 segments bilaterally. Distal PCA branch vessels are intact. Venous sinuses: The dural sinuses are patent. Straight sinus and deep cerebral veins are intact. Cortical veins are unremarkable. Anatomic variants: None. Review of the MIP images confirms the above findings IMPRESSION: 1. Atherosclerotic changes at the aortic arch, carotid bifurcations, and cavernous internal carotid arteries bilaterally without significant stenosis. 2. Moderate stenosis of the distal P2 segments  bilaterally without other significant stenosis in the posterior circulation. 3. No emergent large vessel occlusion. 4. Multilevel spondylosis of the cervical spine as described. Electronically Signed   By: San Morelle M.D.   On: 09/03/2017 15:12   Ct Angio Neck W Or Wo Contrast  Result Date: 09/03/2017 CLINICAL DATA:  Code stroke. Bilateral finger ataxia. Slurred speech. EXAM: CT ANGIOGRAPHY HEAD AND NECK TECHNIQUE: Multidetector CT imaging of the head and neck was performed using the standard protocol during bolus administration of intravenous contrast. Multiplanar CT image reconstructions and MIPs were obtained to evaluate the vascular anatomy. Carotid stenosis measurements (when applicable) are obtained utilizing NASCET criteria, using the distal internal carotid diameter as the denominator. CONTRAST:  11mL ISOVUE-370 IOPAMIDOL (ISOVUE-370) INJECTION 76% COMPARISON:  CT head without contrast 09/03/2017. FINDINGS: CTA NECK FINDINGS Aortic arch: There is a common origin of the left common carotid artery and the innominate artery. Atherosclerotic calcifications are present at the aortic arch without significant stenosis or aneurysm. There is mild narrowing of the left common carotid artery proximally, less than 50%. Right carotid system: The right common carotid artery is within normal limits. Atherosclerotic calcifications are present at the right carotid bifurcation without a significant stenosis relative to the more distal vessel. Left carotid system: The left common carotid artery is otherwise within normal limits. Atherosclerotic calcifications are present  at the left carotid bifurcation without a significant stenosis relative to the more distal vessel. Vertebral arteries: The vertebral arteries originate from the subclavian arteries bilaterally without a significant stenosis. The right vertebral artery is slightly larger than the left. There is no significant stenosis of either vertebral artery in  the neck. Skeleton: Chronic endplate degenerative changes are present C5-6 and C6-7. Marrow changes are most evident at C6 and C7. Osseous foraminal narrowing is greatest at C5-6 and C6-7, right greater than left the patient is edentulous. No focal lytic or blastic lesions are present. Other neck: Soft tissues the neck are otherwise unremarkable. No focal mucosal or submucosal lesions are present. The salivary glands are within normal limits bilaterally. No significant cervical adenopathy is present. Upper chest: Mild dependent atelectasis is present. The lungs are otherwise clear. The thoracic inlet is within normal limits. Review of the MIP images confirms the above findings CTA HEAD FINDINGS Anterior circulation: Atherosclerotic calcifications are present within the cavernous internal carotid arteries bilaterally without a significant stenosis relative to the more distal vessel. The ICA termini are normal. A1 and M1 segments are normal. The anterior communicating artery is patent. MCA bifurcations are intact. ACA and MCA branch vessels are unremarkable. Posterior circulation: The left PICA origin is visualized and normal. The right AICA is dominant. The basilar artery is normal. Both posterior cerebral arteries originate from basilar tip. Moderate stenoses are present in the distal P2 segments bilaterally. Distal PCA branch vessels are intact. Venous sinuses: The dural sinuses are patent. Straight sinus and deep cerebral veins are intact. Cortical veins are unremarkable. Anatomic variants: None. Review of the MIP images confirms the above findings IMPRESSION: 1. Atherosclerotic changes at the aortic arch, carotid bifurcations, and cavernous internal carotid arteries bilaterally without significant stenosis. 2. Moderate stenosis of the distal P2 segments bilaterally without other significant stenosis in the posterior circulation. 3. No emergent large vessel occlusion. 4. Multilevel spondylosis of the cervical  spine as described. Electronically Signed   By: San Morelle M.D.   On: 09/03/2017 15:12   Ct Head Code Stroke Wo Contrast  Result Date: 09/03/2017 CLINICAL DATA:  Code stroke. 67 year old male with slurred speech, abnormal gait, bilateral finger ataxia. EXAM: CT HEAD WITHOUT CONTRAST TECHNIQUE: Contiguous axial images were obtained from the base of the skull through the vertex without intravenous contrast. COMPARISON:  01/10/2017 noncontrast head CT and earlier. FINDINGS: Brain: Chronic inferior frontal, mostly right side, encephalomalacia. Associated right anterior and lateral temporal lobe chronic encephalomalacia. These are probably posttraumatic and unchanged. Stable cerebral volume, and stable gray-white matter differentiation elsewhere. No cortically based acute infarct identified. No acute intracranial hemorrhage identified. No midline shift, mass effect, or evidence of intracranial mass lesion. Mild ex vacuo enlargement of the right lateral ventricle is chronic. No ventriculomegaly. Vascular: Calcified atherosclerosis at the skull base. No suspicious intracranial vascular hyperdensity. Skull: Chronic right lamina papyracea fracture. Possible chronic left maxillary sinus fracture. No acute osseous abnormality identified. Sinuses/Orbits: Visualized paranasal sinuses and mastoids are stable and well pneumatized. Other: Left lateral and superior scalp soft tissue scarring. No acute scalp or orbits soft tissue findings. ASPECTS ([place] Stroke Program Early CT Score) - Ganglionic level infarction (caudate, lentiform nuclei, internal capsule, insula, M1-M3 cortex): 7 - Supraganglionic infarction (M4-M6 cortex): 3 Total score (0-10 with 10 being normal): 10 IMPRESSION: 1. 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 2. No cortically based acute  infarct or acute intracranial  hemorrhage identified. 3. ASPECTS is 10 (allowing for chronic encephalomalacia). 4. These results were communicated to Dr. Cheral Marker at 2:21 pmon 3/12/2019by text page via the [facility] messaging system. Electronically Signed   By: Genevie Ann M.D.   On: 09/03/2017 14:23    MRI pending  TTE pending   PHYSICAL EXAM  Temp:  [98 F (36.7 C)-98.7 F (37.1 C)] 98.5 F (36.9 C) (03/13 1200) Pulse Rate:  [77-89] 82 (03/13 1200) Resp:  [11-29] 20 (03/13 1200) BP: (101-174)/(56-104) 128/76 (03/13 1200) SpO2:  [92 %-99 %] 95 % (03/13 1200) Weight:  [187 lb 6.3 oz (85 kg)] 187 lb 6.3 oz (85 kg) (03/12 2000)  General - Well nourished, well developed, in no apparent distress.  Ophthalmologic - fundi not visualized due to noncooperation.  Cardiovascular - Regular rate and rhythm.  Mental Status -  Level of arousal and orientation to time, place, and person were intact. Language including expression, naming, repetition, comprehension was assessed and found intact. Fund of Knowledge was assessed and was intact.  Cranial Nerves II - XII - II - Visual field intact OU. III, IV, VI - Extraocular movements intact. V - Facial sensation intact bilaterally. VII - Facial movement intact bilaterally. VIII - Hearing & vestibular intact bilaterally. X - Palate elevates symmetrically. XI - Chin turning & shoulder shrug intact bilaterally. XII - Tongue protrusion intact.  Motor Strength - The patient's strength was normal in all extremities and pronator drift was absent.  Bulk was normal and fasciculations were absent.   Motor Tone - Muscle tone was assessed at the neck and appendages and was normal.  Reflexes - The patient's reflexes were symmetrical in all extremities and he had no pathological reflexes.  Sensory - Light touch, temperature/pinprick were assessed and were symmetrical.    Coordination - The patient had normal movements in the hands with no ataxia or dysmetria. BLE dysmetria on HTS. Tremor  was absent.  Gait and Station - deferred.   ASSESSMENT/PLAN Mr. GENEVA PALLAS is a 67 y.o. male with history of TBI in 01/2012, bipolar disorder, seizure in 02/2013, hypertension, substance abuse admitted for acute onset dysarthria, difficulty walking, dizziness and ataxia. TPA given.    TIA - could be posterior circulation TIA  Resultant gait difficulty  MRI no acute infarct, right frontal parietal encephalomalacia  CT head and neck distal P2 bilateral stenosis  2D Echo pending  LDL 66  HgbA1c 6.0  SCDs for VTE prophylaxis Check puncture sites for bleeding or hematomas. Bleeding precautions Fall precautions  Diet Heart Room service appropriate? Yes; Fluid consistency: Thin   No antithrombotic prior to admission, now on aspirin 325 mg daily post 24h TPA  Patient counseled to be compliant with his antithrombotic medications  Ongoing aggressive stroke risk factor management  Therapy recommendations: Pending  Disposition: Pending  History of TBI and bipolar disorder  With behavioral outburst  MRI and CT showed right frontoparietal encephalomalacia  On high-dose Seroquel at night  On Tegretol  History of seizure  On Tegretol 300 mg twice daily  Follows with Dr. Jannifer Franklin  Tegretol level is 13.7  Tegretol free level sent  Decrease Tegretol to 200/300  To follow with Dr. Jannifer Franklin as outpatient  Hypertension Stable Permissive hypertension (OK if <180/105) for 24-48 hours post stroke and then gradually normalized within 5-7 days.  Long term BP goal normotensive  Hyperlipidemia  Home meds: Lipitor  LDL 66, goal < 70  Now on Lipitor  Continue statin  at discharge  Other Stroke Risk Factors  Advanced age  Obesity, Body mass index is 30.25 kg/m.   Other Active Problems    Hospital day # 1  This patient is critically ill due to possible stroke, status post TPA, seizure history and at significant risk of neurological worsening, death form  recurrent stroke, TIA, hemorrhagic conversion, seizure. This patient's care requires constant monitoring of vital signs, hemodynamics, respiratory and cardiac monitoring, review of multiple databases, neurological assessment, discussion with family, other specialists and medical decision making of high complexity. I spent 40 minutes of neurocritical care time in the care of this patient.    Rosalin Hawking, MD PhD Stroke Neurology 09/04/2017 5:39 PM    To contact Stroke Continuity provider, please refer to [email or web address] After hours, contact General Neurology

## 2017-09-04 NOTE — Plan of Care (Signed)
Patient with no acute changes overnight, resting comfortably in bed at this time. No complaints per patient.

## 2017-09-04 NOTE — Progress Notes (Signed)
Around 1900 tonight patient became very agitated. Per prior nurse patient had increasing confusion and agitation throughout the day. Attempted to obtain MRI earlier and patient very uncooperative, ripping helmet off. On my arrival patient was combative, attempting to hit staff, and refusing to stay in bed. Patient was given IV ativan and haldol as directed by neurologist. Patient also placed in bilateral wrist restraints and posey waist belt. Patient refusing anything to drink or eat at this time and states he doesn't need to use the restroom. Interventions and reasoning explained to patient. Patient now resting in bed and denies complaints at this time, alert and oriented to self, place, and states he is at the hospital because he could not walk. Per wife patient has similar episode that required restraints and medication for agitation on his prior admission. Will continue to monitor.

## 2017-09-04 NOTE — Progress Notes (Signed)
Rehab Admissions Coordinator Note:  Patient was screened by Cleatrice Burke for appropriateness for an Inpatient Acute Rehab Consult per therapy recommendations. At this time, we are recommending Inpatient Rehab consult.  Cleatrice Burke 09/04/2017, 9:25 PM  I can be reached at [phone].

## 2017-09-04 NOTE — Progress Notes (Signed)
  Echocardiogram 2D Echocardiogram has been performed.  Stephen Buckley 09/04/2017, 12:34 PM

## 2017-09-04 NOTE — Progress Notes (Signed)
OT Cancellation    09/04/17 0700  OT Visit Information  Last OT Received On 09/04/17  Reason Eval/Treat Not Completed Active bedrest order. Will await increase in activity orders prior to initiating OT evaluation. Thank you.   [place], OTR/L Acute Rehab Pager: [phone] Office: [phone]

## 2017-09-04 NOTE — Progress Notes (Signed)
PT Cancellation Note  Patient Details Name: Stephen Buckley MRN: [id number] DOB: [birth year]   Cancelled Treatment:    Reason Eval/Treat Not Completed: Active bedrest order.  Pt is on bedrest.  If bedrest is removed today, please page so we can come back around to see pt.  Thanks,    Barbarann Ehlers. Donabelle Molden, [place], DPT [phone]   09/04/2017, 6:15 AM

## 2017-09-04 NOTE — Evaluation (Signed)
Occupational Therapy Evaluation Patient Details Name: Stephen Buckley MRN: [id number] DOB: 1951/04/24 Today's Date: 09/04/2017    History of Present Illness 67 y.o. male admitted for SOB, dizziness, and sudden inability to walk or sit upright. tPA administered. CT negative for acute findings. Pending MRI results at this time. PMH significant for TBI (2013), seizures, HTN, hepatitis, asthma, anemia, and acid reflux.   Clinical Impression   PTA, pt was living with his wife and was independent; retiring in April. Pt currently requiring Min A for sitting ADLs and Max A +2 for functional mobility. Presenting with poor cognition, motor planning, vision, balance, and safety awareness. Pt requiring Max cues throughout session for safety. Pt will good family support. Pt will require further acute OT to facilitate safe dc. Due to pt's age, PLOF, and significant deficits, recommend dc to CIR for further intensive OT to optimize safety, independence with ADLs, and return to PLOF.     Follow Up Recommendations  CIR;Supervision/Assistance - 24 hour    Equipment Recommendations  Other (comment)(Defer to next venue)    Recommendations for Other Services Rehab consult;PT consult;Speech consult     Precautions / Restrictions Precautions Precautions: Fall Restrictions Weight Bearing Restrictions: No      Mobility Bed Mobility Overal bed mobility: Needs Assistance Bed Mobility: Supine to Sit;Sit to Supine       Sit to supine: Min assist   General bed mobility comments: HOB elevated ~30 deg, VCs for sequencing, assist for safety and balance with truncal ataxia  Transfers Overall transfer level: Needs assistance Equipment used: 2 person hand held assist Transfers: Sit to/from Stand Sit to Stand: Mod assist;+2 physical assistance         General transfer comment: mod assist required to prevent falling due to ataxia/decreased balance in all directions    Balance Overall balance  assessment: Needs assistance Sitting-balance support: Bilateral upper extremity supported;Feet supported Sitting balance-Leahy Scale: Poor Sitting balance - Comments: ataxic Postural control: Other (comment)(decreased in all directions) Standing balance support: Bilateral upper extremity supported;During functional activity Standing balance-Leahy Scale: Poor Standing balance comment: extremely ataxic requiring mod assist +2 to remain upright                           ADL either performed or assessed with clinical judgement   ADL Overall ADL's : Needs assistance/impaired Eating/Feeding: Set up;Supervision/ safety;Sitting   Grooming: Oral care;Minimal assistance;Sitting Grooming Details (indicate cue type and reason): Min A to sitting balance at EOB due to poor trunk control and impulsivity. Pt demosntrating poor motor planning and missing his mouth wiht toothbrush. Dropping tooth paste cap. Min A to manage cup for rinsing. Upper Body Bathing: Minimal assistance;Sitting   Lower Body Bathing: Maximal assistance;Sit to/from stand;+2 for physical assistance   Upper Body Dressing : Minimal assistance;Sitting   Lower Body Dressing: Maximal assistance;+2 for physical assistance;Sit to/from stand   Toilet Transfer: Maximal assistance;+2 for physical assistance;Ambulation(Simulated in room)           Functional mobility during ADLs: Maximal assistance;+2 for physical assistance;Cueing for sequencing;Cueing for safety General ADL Comments: Pt significantly limited by cognition, impulsivity, balance, ataxia, and vision.      Vision Baseline Vision/History: Wears glasses Wears Glasses: At all times Patient Visual Report: Blurring of vision;Diplopia Vision Assessment?: Yes;Vision impaired- to be further tested in functional context Additional Comments: Noted horizontal nystagmus at midline, gazing left, and gazing right. Reporting double vision. decreased smooth tracking to all  visual fields.  Needs further assessment. unable to compelte vision assessment due to agitation at end     Perception     Praxis      Pertinent Vitals/Pain Pain Assessment: No/denies pain     Hand Dominance Right   Extremity/Trunk Assessment Upper Extremity Assessment Upper Extremity Assessment: RUE deficits/detail;LUE deficits/detail RUE Deficits / Details: WFL strength. Presenting with ataxia and poor coordination as seen during testing (i.e. finger to nose test - pt overshooting significantly) and funcitonal tasks (oral care). Pt with dysdiadochokinesis. RUE Coordination: decreased fine motor;decreased gross motor LUE Deficits / Details: WFL strength. Presenting with ataxia and poor coordination as seen during testing (i.e. finger to nose test - pt overshooting significantly) and funcitonal tasks (oral care). Pt with dysdiadochokinesis. LUE Coordination: decreased fine motor;decreased gross motor   Lower Extremity Assessment Lower Extremity Assessment: Defer to PT evaluation RLE Deficits / Details: grossly 5/5 RLE Sensation: WNL RLE Coordination: decreased gross motor(incoordination heel to shin,overshooting with finger to nose) LLE Deficits / Details: grossly 4/5 LLE Sensation: decreased light touch LLE Coordination: decreased gross motor(incoordination heel to shin,overshooting with finger to nose)   Cervical / Trunk Assessment Cervical / Trunk Assessment: Normal;Other exceptions Cervical / Trunk Exceptions: Poor trunk control   Communication Communication Communication: Other (comment);Expressive difficulties(dysarthric)   Cognition Arousal/Alertness: Awake/alert Behavior During Therapy: Impulsive;Restless Overall Cognitive Status: Impaired/Different from baseline Area of Impairment: Attention;Memory;Following commands;Safety/judgement;Awareness;Problem solving;Orientation                 Orientation Level: Disoriented to;Situation Current Attention Level:  Sustained Memory: Decreased recall of precautions;Decreased short-term memory Following Commands: Follows one step commands inconsistently;Follows one step commands with increased time Safety/Judgement: Decreased awareness of safety;Decreased awareness of deficits Awareness: Intellectual Problem Solving: Slow processing;Difficulty sequencing;Requires verbal cues;Requires tactile cues General Comments: Pt presenting with significant impulsivity and poor safety awarness. Pt requiring increase cues throughout session for attention, problem solving, and safe sequencing. Pt with poor deficit awarenss stating at end of session "I am going home tonight, and I will come back tomorroe for rehab." When wife telling pt he isn't going home, he came agitated and started tryign to crawl out of bed saying "I am going home now." Calming pt and encouraging him to rest till MRI. Pt with history of TBI.   General Comments  Wife present throguhout session    Exercises     Shoulder Instructions      Home Living Family/patient expects to be discharged to:: Private residence Living Arrangements: Spouse/significant other Available Help at Discharge: Family Type of Home: Apartment Home Access: Stairs to enter Technical brewer of Steps: 1 Entrance Stairs-Rails: None Home Layout: Two level;Bed/bath upstairs(only bathroom is on  2nd floor) Alternate Level Stairs-Number of Steps: 8 Alternate Level Stairs-Rails: Right Bathroom Shower/Tub: Teacher, early years/pre: Standard     Home Equipment: Cane - single point          Prior Functioning/Environment Level of Independence: Independent with assistive device(s)(with cane)        Comments: does not drive, independent with household activites and ambulation, uses cane for ambulation in community. Enjoys playing cards        OT Problem List: Decreased strength;Decreased activity tolerance;Decreased range of motion;Impaired balance (sitting  and/or standing);Impaired vision/perception;Decreased coordination;Decreased cognition;Decreased safety awareness;Decreased knowledge of use of DME or AE;Decreased knowledge of precautions;Impaired UE functional use      OT Treatment/Interventions: Self-care/ADL training;Therapeutic exercise;Energy conservation;DME and/or AE instruction;Therapeutic activities;Patient/family education    OT Goals(Current goals can be found in the  care plan section) Acute Rehab OT Goals Patient Stated Goal: to go home OT Goal Formulation: With patient Time For Goal Achievement: 09/18/17 Potential to Achieve Goals: Good  OT Frequency: Min 3X/week   Barriers to D/C:            Co-evaluation PT/OT/SLP Co-Evaluation/Treatment: Yes Reason for Co-Treatment: Complexity of the patient's impairments (multi-system involvement);For patient/therapist safety   OT goals addressed during session: ADL's and self-care      AM-PAC PT "6 Clicks" Daily Activity     Outcome Measure Help from another person eating meals?: A Little Help from another person taking care of personal grooming?: A Little Help from another person toileting, which includes using toliet, bedpan, or urinal?: A Lot Help from another person bathing (including washing, rinsing, drying)?: A Lot Help from another person to put on and taking off regular upper body clothing?: A Little Help from another person to put on and taking off regular lower body clothing?: A Lot 6 Click Score: 15   End of Session Equipment Utilized During Treatment: Gait belt Nurse Communication: Mobility status;Precautions  Activity Tolerance: Patient tolerated treatment well Patient left: in bed;with call bell/phone within reach;with bed alarm set;with family/visitor present  OT Visit Diagnosis: Unsteadiness on feet (R26.81);Other abnormalities of gait and mobility (R26.89);Muscle weakness (generalized) (M62.81);Low vision, both eyes (H54.2);Ataxia, unspecified (R27.0);Other  symptoms and signs involving cognitive function                Time: 6384-6659 OT Time Calculation (min): 31 min Charges:  OT General Charges $OT Visit: 1 Visit OT Evaluation $OT Eval Moderate Complexity: 1 Mod G-Codes:     [place] MSOT, OTR/L Acute Rehab Pager: [phone] Office: [place] 09/04/2017, 5:59 PM

## 2017-09-04 NOTE — Progress Notes (Signed)
SLP Cancellation Note  Patient Details Name: Stephen Buckley MRN: [id number] DOB: September 19, 1950   Cancelled treatment:       Reason Eval/Treat Not Completed: Other (comment) Pt bathing, will return as available.    Izekiel Flegel, Katherene Ponto 09/04/2017, 10:32 AM

## 2017-09-04 NOTE — Evaluation (Addendum)
Physical Therapy Evaluation Patient Details Name: Stephen Buckley MRN: [id number] DOB: 07/18/1950 Today's Date: 09/04/2017   History of Present Illness  67 y.o. male admitted for SOB, dizziness, and sudden inability to walk or sit upright. tPA administered. CT negative for acute findings. Pending MRI results at this time. PMH significant for TBI (2013), seizures, HTN, hepatitis, asthma, anemia, and acid reflux.  Clinical Impression  Pt presents with overall decrease in functional mobility secondary to above. Pt with significant ataxia, impaired balance, and decreased proprioception. He requires +2 physical assist to prevent falls for all transfers and ambulation due to wobbling and staggering in all directions unpredictably without any ability to self correct or maintain upright. Very mild strength and sensation impairment noted on left compared to right LE. He demonstrates impulsivity, impaired judgement, safety awareness, and problem solving. Pt is motivated to participate in therapies to regain PLOF. His wife is present and supportive throughout session. Pt to benefit from  comprehensive inpatient rehabilitation services maximize safety, functional mobility, and independence. Will continue to follow acutely and progress as tolerated.      Follow Up Recommendations CIR    Equipment Recommendations  Other (comment)(defer to next venue of care)    Recommendations for Other Services Rehab consult     Precautions / Restrictions Precautions Precautions: Fall Restrictions Weight Bearing Restrictions: No      Mobility  Bed Mobility Overal bed mobility: Needs Assistance Bed Mobility: Supine to Sit     Supine to sit: Min assist;HOB elevated     General bed mobility comments: HOB elevated ~30 deg, VCs for sequencing, assist for safety and balance with significant truncal ataxia with EOB sitting. Unable to maintain upright sitting with poor spacial awareness. Sways in all directions  with bil UE support and feet supported.  Transfers Overall transfer level: Needs assistance Equipment used: 2 person hand held assist with wrap around assist on gait belt Transfers: Sit to/from Stand Sit to Stand: Mod assist;+2 physical assistance         General transfer comment: mod assist required to prevent falling due to decreased balance and spacial awareness as demonstrated by leaning anteriorly then swaying laterally without ability to self correct to maintain midline static standing. Pt states like it "feels like the ground is moving." pt able to perform anterior translation and demonstrates adequate power to boost up for sit>stand.  Ambulation/Gait Ambulation/Gait assistance: Max assist;+2 physical assistance Ambulation Distance (Feet): 15 Feet with seated rest break after ~8 ft Assistive device: 2 person hand held assist with wrap around assist on gait belt Gait Pattern/deviations: Step-to pattern;Decreased stride length;Ataxic;Staggering left;Staggering right Gait velocity: decreased Gait velocity interpretation: Below normal speed for age/gender General Gait Details:Pt with ataxia and very impaired proprioceptive awareness and continuously sways/staggers anteriorly, posteriorly, right, and left and intermittent pushing toward all directions with no ability to self correct requiring +2 max assist to stay upright. Pt states he feels like he is "rocking on a boat." no noted improvement with gaze stabilization.  Stairs            Wheelchair Mobility    Modified Rankin (Stroke Patients Only) Modified Rankin (Stroke Patients Only) Pre-Morbid Rankin Score: Slight disability Modified Rankin: Moderately severe disability     Balance Overall balance assessment: Needs assistance Sitting-balance support: Bilateral upper extremity supported;Feet supported Sitting balance-Leahy Scale: Poor Sitting balance - Comments: ataxic, trunk wobbling in all directions and cannot  maintain upright sitting for any period of time without significant physical assistance Postural control: pt  demonstrates no ability to maintain postural and lacks proprioceptive awareness Standing balance support: Bilateral upper extremity supported;During functional activity Standing balance-Leahy Scale: Poor Standing balance comment: extremely ataxic and constantly wobbling/staggering all over the place requiring max assist +2 with wrap around support on gait belt to remain upright in both static standing and during ambulation.                              Pertinent Vitals/Pain Pain Assessment: No/denies pain    Home Living Family/patient expects to be discharged to:: Private residence Living Arrangements: Spouse/significant other Available Help at Discharge: Family Type of Home: Apartment Home Access: Stairs to enter Entrance Stairs-Rails: None Entrance Stairs-Number of Steps: 1 Home Layout: Two level;Bed/bath upstairs(only bathroom is on  2nd floor) Home Equipment: Cane - single point      Prior Function Level of Independence: Independent with assistive device(s)(with cane)         Comments: does not drive, independent with household activites and ambulation, uses cane for ambulation in community     Hand Dominance   Dominant Hand: Right    Extremity/Trunk Assessment   Upper Extremity Assessment Upper Extremity Assessment: Defer to OT evaluation    Lower Extremity Assessment Lower Extremity Assessment: RLE deficits/detail;LLE deficits/detail RLE Deficits / Details: grossly 5/5 RLE Sensation: WNL RLE Coordination: decreased gross motor(incoordination heel to shin,overshooting with finger to nose) LLE Deficits / Details: grossly 4/5 LLE Sensation: decreased light touch LLE Coordination: decreased gross motor(incoordination heel to shin,overshooting with finger to nose)       Communication   Communication: Other (comment)(dysarthric)  Cognition  Arousal/Alertness: Awake/alert Behavior During Therapy: Impulsive;Restless Overall Cognitive Status: Impaired/Different from baseline Area of Impairment: Attention;Memory;Following commands;Safety/judgement;Awareness;Problem solving;Orientation                 Orientation Level: Disoriented to;Situation Current Attention Level: Sustained Memory: Decreased recall of precautions Following Commands: Follows one step commands inconsistently;Follows one step commands with increased time;Follows multi-step commands inconsistently;Follows multi-step commands with increased time Safety/Judgement: Decreased awareness of safety;Decreased awareness of deficits Awareness: Intellectual Problem Solving: Slow processing;Difficulty sequencing;Requires verbal cues;Requires tactile cues General Comments: pt extremely impulsive with decreased awareness of deficits. Pt became agitated when told he can not go home.       General Comments General comments (skin integrity, edema, etc.): pt's wife present and supportive, direction changing nystagmus present    Exercises     Assessment/Plan    PT Assessment Patient needs continued PT services  PT Problem List Decreased balance;Decreased mobility;Decreased coordination;Decreased cognition;Decreased safety awareness;Decreased knowledge of precautions;Impaired sensation       PT Treatment Interventions DME instruction;Gait training;Stair training;Functional mobility training;Therapeutic activities;Therapeutic exercise;Balance training;Neuromuscular re-education;Cognitive remediation;Patient/family education;Manual techniques;Modalities    PT Goals (Current goals can be found in the Care Plan section)  Acute Rehab PT Goals Patient Stated Goal: to go home PT Goal Formulation: With patient Time For Goal Achievement: 09/18/17 Potential to Achieve Goals: Good    Frequency Min 4X/week   Barriers to discharge        Co-evaluation PT/OT/SLP  Co-Evaluation/Treatment: Yes Reason for Co-Treatment: Complexity of the patient's impairments (multi-system involvement);For patient/therapist safety PT goals addressed during session: Mobility/safety with mobility;Balance;Strengthening/ROM OT goals addressed during session: ADL's and self-care       AM-PAC PT "6 Clicks" Daily Activity  Outcome Measure Difficulty turning over in bed (including adjusting bedclothes, sheets and blankets)?: A Little Difficulty moving from lying on back to sitting on  the side of the bed? : Unable Difficulty sitting down on and standing up from a chair with arms (e.g., wheelchair, bedside commode, etc,.)?: Unable Help needed moving to and from a bed to chair (including a wheelchair)?: A Lot Help needed walking in hospital room?: A Lot Help needed climbing 3-5 steps with a railing? : A Lot 6 Click Score: 11    End of Session Equipment Utilized During Treatment: Gait belt Activity Tolerance: Patient tolerated treatment well Patient left: in bed;with call bell/phone within reach;with family/visitor present;with SCD's reapplied Nurse Communication: Mobility status PT Visit Diagnosis: Unsteadiness on feet (R26.81);Ataxic gait (R26.0);Other abnormalities of gait and mobility (R26.89);Other symptoms and signs involving the nervous system (R29.898)    Time: 0867-6195 PT Time Calculation (min) (ACUTE ONLY): 47 min   Charges:         PT G Codes:        Vic Ripper, SPT  Vic Ripper 09/04/2017, 5:11 PM

## 2017-09-05 DIAGNOSIS — F0280 Dementia in other diseases classified elsewhere without behavioral disturbance: Secondary | ICD-10-CM

## 2017-09-05 DIAGNOSIS — S069X0S Unspecified intracranial injury without loss of consciousness, sequela: Secondary | ICD-10-CM

## 2017-09-05 DIAGNOSIS — R269 Unspecified abnormalities of gait and mobility: Secondary | ICD-10-CM

## 2017-09-05 DIAGNOSIS — R4689 Other symptoms and signs involving appearance and behavior: Secondary | ICD-10-CM

## 2017-09-05 DIAGNOSIS — S069X9S Unspecified intracranial injury with loss of consciousness of unspecified duration, sequela: Secondary | ICD-10-CM

## 2017-09-05 LAB — CBC
HCT: 41.1 % (ref 39.0–52.0)
Hemoglobin: 13.5 g/dL (ref 13.0–17.0)
MCH: 29.7 pg (ref 26.0–34.0)
MCHC: 32.8 g/dL (ref 30.0–36.0)
MCV: 90.5 fL (ref 78.0–100.0)
PLATELETS: 163 10*3/uL (ref 150–400)
RBC: 4.54 MIL/uL (ref 4.22–5.81)
RDW: 13.4 % (ref 11.5–15.5)
WBC: 3 10*3/uL — ABNORMAL LOW (ref 4.0–10.5)

## 2017-09-05 LAB — BASIC METABOLIC PANEL
Anion gap: 8 (ref 5–15)
BUN: 6 mg/dL (ref 6–20)
CO2: 22 mmol/L (ref 22–32)
CREATININE: 0.78 mg/dL (ref 0.61–1.24)
Calcium: 8.6 mg/dL — ABNORMAL LOW (ref 8.9–10.3)
Chloride: 110 mmol/L (ref 101–111)
GFR calc Af Amer: >60 mL/min (ref >60)
GFR calc non Af Amer: >60 mL/min (ref >60)
Glucose, Bld: 91 mg/dL (ref 65–99)
Potassium: 3.9 mmol/L (ref 3.5–5.1)
SODIUM: 140 mmol/L (ref 135–145)

## 2017-09-05 LAB — CARBAMAZEPINE, FREE AND TOTAL
CARBAMAZEPINE FREE: 4.4 ug/mL — AB (ref 0.6–4.2)
Carbamazepine, Total: 14.3 ug/mL — ABNORMAL HIGH (ref 4.0–12.0)

## 2017-09-05 MED ORDER — LORAZEPAM 2 MG/ML IJ SOLN
1.0000 mg | Freq: Four times a day (QID) | INTRAMUSCULAR | Status: DC | PRN
  Administered 2017-09-05 – 2017-09-06 (×3): 1 mg via INTRAVENOUS
  Filled 2017-09-05 (×3): qty 1

## 2017-09-05 NOTE — Progress Notes (Signed)
STROKE TEAM PROGRESS NOTE   SUBJECTIVE (INTERVAL HISTORY) His wife is at the bedside.  Overall he feels his condition is gradually improving. MRI no acute stroke, worked with PT/OT and recommend CIR. Pt still has intermittent behavior outburst, has ativan PRN.     OBJECTIVE Temp:  [97.6 F (36.4 C)-98.5 F (36.9 C)] 98.5 F (36.9 C) (03/14 2000) Pulse Rate:  [60-93] 93 (03/14 2000) Cardiac Rhythm: Normal sinus rhythm (03/14 1934) Resp:  [16-19] 18 (03/14 2000) BP: (106-164)/(58-85) 164/85 (03/14 2000) SpO2:  [93 %-100 %] 96 % (03/14 2000) Weight:  [189 lb 9.5 oz (86 kg)] 189 lb 9.5 oz (86 kg) (03/14 0500)  No results for input(s): GLUCAP in the last 168 hours. Recent Labs  Lab 09/03/17 1430 09/03/17 1432 09/04/17 0704 09/05/17 0359  NA 141 144 140 140  K 3.7 3.6 4.0 3.9  CL 112* 108 111 110  CO2 21*  --  22 22  GLUCOSE 104* 101* 90 91  BUN 7 7 7 6   CREATININE 0.94 0.90 0.89 0.78  CALCIUM 8.8*  --  8.3* 8.6*   Recent Labs  Lab 09/03/17 1430  AST 24  ALT 29  ALKPHOS 71  BILITOT 0.5  PROT 6.4*  ALBUMIN 4.0   Recent Labs  Lab 09/03/17 1430 09/03/17 1432 09/04/17 0704 09/05/17 0359  WBC 4.8  --  3.1* 3.0*  NEUTROABS 2.5  --   --   --   HGB 14.1 14.6 12.8* 13.5  HCT 42.2 43.0 38.8* 41.1  MCV 91.1  --  90.4 90.5  PLT 165  --  150 163   No results for input(s): CKTOTAL, CKMB, CKMBINDEX, TROPONINI in the last 168 hours. Recent Labs    09/03/17 1430  LABPROT 14.4  INR 1.13   No results for input(s): COLORURINE, LABSPEC, PHURINE, GLUCOSEU, HGBUR, BILIRUBINUR, KETONESUR, PROTEINUR, UROBILINOGEN, NITRITE, LEUKOCYTESUR in the last 72 hours.  Invalid input(s): APPERANCEUR     Component Value Date/Time   CHOL 101 09/04/2017 0346   TRIG 76 09/04/2017 0346   HDL 20 (L) 09/04/2017 0346   CHOLHDL 5.1 09/04/2017 0346   VLDL 15 09/04/2017 0346   LDLCALC 66 09/04/2017 0346   Lab Results  Component Value Date   HGBA1C 6.0 (H) 09/04/2017      Component Value  Date/Time   LABOPIA NONE DETECTED 09/04/2017 1102   COCAINSCRNUR NONE DETECTED 09/04/2017 1102   LABBENZ NONE DETECTED 09/04/2017 1102   AMPHETMU NONE DETECTED 09/04/2017 1102   THCU NONE DETECTED 09/04/2017 1102   LABBARB NONE DETECTED 09/04/2017 1102    No results for input(s): ETH in the last 168 hours.  I have personally reviewed the radiological images below and agree with the radiology interpretations.  Ct Angio Head W Or Wo Contrast  Result Date: 09/03/2017 CLINICAL DATA:  Code stroke. Bilateral finger ataxia. Slurred speech. EXAM: CT ANGIOGRAPHY HEAD AND NECK TECHNIQUE: Multidetector CT imaging of the head and neck was performed using the standard protocol during bolus administration of intravenous contrast. Multiplanar CT image reconstructions and MIPs were obtained to evaluate the vascular anatomy. Carotid stenosis measurements (when applicable) are obtained utilizing NASCET criteria, using the distal internal carotid diameter as the denominator. CONTRAST:  88mL ISOVUE-370 IOPAMIDOL (ISOVUE-370) INJECTION 76% COMPARISON:  CT head without contrast 09/03/2017. FINDINGS: CTA NECK FINDINGS Aortic arch: There is a common origin of the left common carotid artery and the innominate artery. Atherosclerotic calcifications are present at the aortic arch without significant stenosis or aneurysm. There is mild  narrowing of the left common carotid artery proximally, less than 50%. Right carotid system: The right common carotid artery is within normal limits. Atherosclerotic calcifications are present at the right carotid bifurcation without a significant stenosis relative to the more distal vessel. Left carotid system: The left common carotid artery is otherwise within normal limits. Atherosclerotic calcifications are present at the left carotid bifurcation without a significant stenosis relative to the more distal vessel. Vertebral arteries: The vertebral arteries originate from the subclavian arteries  bilaterally without a significant stenosis. The right vertebral artery is slightly larger than the left. There is no significant stenosis of either vertebral artery in the neck. Skeleton: Chronic endplate degenerative changes are present C5-6 and C6-7. Marrow changes are most evident at C6 and C7. Osseous foraminal narrowing is greatest at C5-6 and C6-7, right greater than left the patient is edentulous. No focal lytic or blastic lesions are present. Other neck: Soft tissues the neck are otherwise unremarkable. No focal mucosal or submucosal lesions are present. The salivary glands are within normal limits bilaterally. No significant cervical adenopathy is present. Upper chest: Mild dependent atelectasis is present. The lungs are otherwise clear. The thoracic inlet is within normal limits. Review of the MIP images confirms the above findings CTA HEAD FINDINGS Anterior circulation: Atherosclerotic calcifications are present within the cavernous internal carotid arteries bilaterally without a significant stenosis relative to the more distal vessel. The ICA termini are normal. A1 and M1 segments are normal. The anterior communicating artery is patent. MCA bifurcations are intact. ACA and MCA branch vessels are unremarkable. Posterior circulation: The left PICA origin is visualized and normal. The right AICA is dominant. The basilar artery is normal. Both posterior cerebral arteries originate from basilar tip. Moderate stenoses are present in the distal P2 segments bilaterally. Distal PCA branch vessels are intact. Venous sinuses: The dural sinuses are patent. Straight sinus and deep cerebral veins are intact. Cortical veins are unremarkable. Anatomic variants: None. Review of the MIP images confirms the above findings IMPRESSION: 1. Atherosclerotic changes at the aortic arch, carotid bifurcations, and cavernous internal carotid arteries bilaterally without significant stenosis. 2. Moderate stenosis of the distal P2  segments bilaterally without other significant stenosis in the posterior circulation. 3. No emergent large vessel occlusion. 4. Multilevel spondylosis of the cervical spine as described. Electronically Signed   By: San Morelle M.D.   On: 09/03/2017 15:12   Ct Head Wo Contrast  Result Date: 09/04/2017 CLINICAL DATA:  67 y/o  M; follow-up stroke patient. EXAM: CT HEAD WITHOUT CONTRAST TECHNIQUE: Contiguous axial images were obtained from the base of the skull through the vertex without intravenous contrast. COMPARISON:  09/03/2017 CT head and CTA head.  09/04/2017 MRI head. FINDINGS: Brain: No evidence of acute infarction, hemorrhage, hydrocephalus, extra-axial collection or mass lesion/mass effect. Encephalomalacia within right anterior frontal lobe, right anterior temporal lobe, left gyrus rectus. Vascular: Calcific atherosclerosis of carotid siphons. No hyperdense vessel identified. Skull: Normal. Negative for fracture or focal lesion. Sinuses/Orbits: Chronic right lamina papyracea fracture with herniation of extraconal fat. Normal aeration of paranasal sinuses and mastoid air cells. Orbits are unremarkable. Other: None. IMPRESSION: 1. No acute intracranial abnormality identified. 2. Stable CT of head with chronic findings as above. Electronically Signed   By: Kristine Garbe M.D.   On: 09/04/2017 18:14   Ct Angio Neck W Or Wo Contrast  Result Date: 09/03/2017 CLINICAL DATA:  Code stroke. Bilateral finger ataxia. Slurred speech. EXAM: CT ANGIOGRAPHY HEAD AND NECK TECHNIQUE: Multidetector CT imaging  of the head and neck was performed using the standard protocol during bolus administration of intravenous contrast. Multiplanar CT image reconstructions and MIPs were obtained to evaluate the vascular anatomy. Carotid stenosis measurements (when applicable) are obtained utilizing NASCET criteria, using the distal internal carotid diameter as the denominator. CONTRAST:  8mL ISOVUE-370 IOPAMIDOL  (ISOVUE-370) INJECTION 76% COMPARISON:  CT head without contrast 09/03/2017. FINDINGS: CTA NECK FINDINGS Aortic arch: There is a common origin of the left common carotid artery and the innominate artery. Atherosclerotic calcifications are present at the aortic arch without significant stenosis or aneurysm. There is mild narrowing of the left common carotid artery proximally, less than 50%. Right carotid system: The right common carotid artery is within normal limits. Atherosclerotic calcifications are present at the right carotid bifurcation without a significant stenosis relative to the more distal vessel. Left carotid system: The left common carotid artery is otherwise within normal limits. Atherosclerotic calcifications are present at the left carotid bifurcation without a significant stenosis relative to the more distal vessel. Vertebral arteries: The vertebral arteries originate from the subclavian arteries bilaterally without a significant stenosis. The right vertebral artery is slightly larger than the left. There is no significant stenosis of either vertebral artery in the neck. Skeleton: Chronic endplate degenerative changes are present C5-6 and C6-7. Marrow changes are most evident at C6 and C7. Osseous foraminal narrowing is greatest at C5-6 and C6-7, right greater than left the patient is edentulous. No focal lytic or blastic lesions are present. Other neck: Soft tissues the neck are otherwise unremarkable. No focal mucosal or submucosal lesions are present. The salivary glands are within normal limits bilaterally. No significant cervical adenopathy is present. Upper chest: Mild dependent atelectasis is present. The lungs are otherwise clear. The thoracic inlet is within normal limits. Review of the MIP images confirms the above findings CTA HEAD FINDINGS Anterior circulation: Atherosclerotic calcifications are present within the cavernous internal carotid arteries bilaterally without a significant  stenosis relative to the more distal vessel. The ICA termini are normal. A1 and M1 segments are normal. The anterior communicating artery is patent. MCA bifurcations are intact. ACA and MCA branch vessels are unremarkable. Posterior circulation: The left PICA origin is visualized and normal. The right AICA is dominant. The basilar artery is normal. Both posterior cerebral arteries originate from basilar tip. Moderate stenoses are present in the distal P2 segments bilaterally. Distal PCA branch vessels are intact. Venous sinuses: The dural sinuses are patent. Straight sinus and deep cerebral veins are intact. Cortical veins are unremarkable. Anatomic variants: None. Review of the MIP images confirms the above findings IMPRESSION: 1. Atherosclerotic changes at the aortic arch, carotid bifurcations, and cavernous internal carotid arteries bilaterally without significant stenosis. 2. Moderate stenosis of the distal P2 segments bilaterally without other significant stenosis in the posterior circulation. 3. No emergent large vessel occlusion. 4. Multilevel spondylosis of the cervical spine as described. Electronically Signed   By: San Morelle M.D.   On: 09/03/2017 15:12   Mr Brain Wo Contrast  Result Date: 09/04/2017 CLINICAL DATA:  67 y/o  M; short-term memory loss. EXAM: MRI HEAD WITHOUT CONTRAST TECHNIQUE: Axial DWI, coronal DWI, axial T2 propeller, and axial T2 FLAIR propeller sequences were acquired. Patient was unable to continue an additional sequences were not acquired. COMPARISON:  09/03/2017 CT angiogram head and CT head. FINDINGS: Brain: No acute infarction, hemorrhage, hydrocephalus, extra-axial collection or mass lesion. Large region of right anterolateral frontal and anterior temporal encephalomalacia. Vascular: Normal flow voids. Skull and  upper cervical spine: Normal marrow signal. Sinuses/Orbits: Chronic right lamina papyracea fracture with herniation of extraconal fat. Normal aeration of  paranasal sinuses and mastoid air cells. Orbits are unremarkable. Other: None. IMPRESSION: 1. No acute or early subacute infarct. 2. Stable region of encephalomalacia in the right anterolateral frontal and temporal lobes. Electronically Signed   By: Kristine Garbe M.D.   On: 09/04/2017 17:36   Ct Head Code Stroke Wo Contrast  Result Date: 09/03/2017 CLINICAL DATA:  Code stroke. 67 year old male with slurred speech, abnormal gait, bilateral finger ataxia. EXAM: CT HEAD WITHOUT CONTRAST TECHNIQUE: Contiguous axial images were obtained from the base of the skull through the vertex without intravenous contrast. COMPARISON:  01/10/2017 noncontrast head CT and earlier. FINDINGS: Brain: Chronic inferior frontal, mostly right side, encephalomalacia. Associated right anterior and lateral temporal lobe chronic encephalomalacia. These are probably posttraumatic and unchanged. Stable cerebral volume, and stable gray-white matter differentiation elsewhere. No cortically based acute infarct identified. No acute intracranial hemorrhage identified. No midline shift, mass effect, or evidence of intracranial mass lesion. Mild ex vacuo enlargement of the right lateral ventricle is chronic. No ventriculomegaly. Vascular: Calcified atherosclerosis at the skull base. No suspicious intracranial vascular hyperdensity. Skull: Chronic right lamina papyracea fracture. Possible chronic left maxillary sinus fracture. No acute osseous abnormality identified. Sinuses/Orbits: Visualized paranasal sinuses and mastoids are stable and well pneumatized. Other: Left lateral and superior scalp soft tissue scarring. No acute scalp or orbits soft tissue findings. ASPECTS ([place] Stroke Program Early CT Score) - Ganglionic level infarction (caudate, lentiform nuclei, internal capsule, insula, M1-M3 cortex): 7 - Supraganglionic infarction (M4-M6 cortex): 3 Total score (0-10 with 10 being normal): 10 IMPRESSION: 1. Stable noncontrast CT  appearance of the head since 2018 with chronic right greater than left frontal and right temporal encephalomalacia, probably the sequelae of trauma. 2. No cortically based acute infarct or acute intracranial hemorrhage identified. 3. ASPECTS is 10 (allowing for chronic encephalomalacia). 4. These results were communicated to Dr. Cheral Marker at 2:21 pmon 3/12/2019by text page via the [facility] messaging system. Electronically Signed   By: Genevie Ann M.D.   On: 09/03/2017 14:23   TTE - LVEF 60-65%, mild LVH, normal wall motion, borderline diastolic   dysfunction, trivial MR, normal LA size, normal IVC.   PHYSICAL EXAM  Temp:  [97.6 F (36.4 C)-98.5 F (36.9 C)] 98.5 F (36.9 C) (03/14 2000) Pulse Rate:  [60-93] 93 (03/14 2000) Resp:  [16-19] 18 (03/14 2000) BP: (106-164)/(58-85) 164/85 (03/14 2000) SpO2:  [93 %-100 %] 96 % (03/14 2000) Weight:  [189 lb 9.5 oz (86 kg)] 189 lb 9.5 oz (86 kg) (03/14 0500)  General - Well nourished, well developed, in no apparent distress.  Ophthalmologic - fundi not visualized due to noncooperation.  Cardiovascular - Regular rate and rhythm.  Mental Status -  Level of arousal and orientation to time, place, and person were intact. Language including expression, naming, repetition, comprehension was assessed and found intact. Fund of Knowledge was assessed and was intact.  Cranial Nerves II - XII - II - Visual field intact OU. III, IV, VI - Extraocular movements intact. V - Facial sensation intact bilaterally. VII - Facial movement intact bilaterally. VIII - Hearing & vestibular intact bilaterally. X - Palate elevates symmetrically. XI - Chin turning & shoulder shrug intact bilaterally. XII - Tongue protrusion intact.  Motor Strength - The patient's strength was normal in all extremities and pronator drift was absent.  Bulk was normal and fasciculations were absent.   Motor Tone -  Muscle tone was assessed at the neck and appendages and was normal.  Reflexes  - The patient's reflexes were symmetrical in all extremities and he had no pathological reflexes.  Sensory - Light touch, temperature/pinprick were assessed and were symmetrical.    Coordination - The patient had normal movements in the hands with no ataxia or dysmetria. Tremor was absent.  Gait and Station - deferred.   ASSESSMENT/PLAN Mr. IGOR BISHOP is a 67 y.o. male with history of TBI in 01/2012, bipolar disorder, seizure in 02/2013, hypertension, substance abuse admitted for acute onset dysarthria, difficulty walking, dizziness and ataxia. TPA given.    TIA - could be posterior circulation TIA  Resultant gait difficulty  MRI no acute infarct, right frontal parietal encephalomalacia  CT head and neck distal P2 bilateral stenosis  2D Echo EF 60-65%  LDL 66  HgbA1c 6.0  SCDs for VTE prophylaxis Check puncture sites for bleeding or hematomas. Bleeding precautions Fall precautions  Diet Heart Room service appropriate? Yes; Fluid consistency: Thin   No antithrombotic prior to admission, now on aspirin 325 mg daily post 24h TPA  Patient counseled to be compliant with his antithrombotic medications  Ongoing aggressive stroke risk factor management  Therapy recommendations: Pending  Disposition: Pending  History of TBI and bipolar disorder  With behavioral outburst  MRI and CT showed right frontoparietal encephalomalacia  On high-dose Seroquel at night  On Tegretol  History of seizure  On Tegretol 300 mg twice daily  Follows with Dr. Jannifer Franklin  Tegretol level is 13.7->14.3  Tegretol free level elevated at 4.4  Decrease Tegretol to 200/300  To follow with Dr. Jannifer Franklin as outpatient  Hypertension Stable Permissive hypertension (OK if <180/105) for 24-48 hours post stroke and then gradually normalized within 5-7 days.  Long term BP goal normotensive  Hyperlipidemia  Home meds: Lipitor  LDL 66, goal < 70  Now on Lipitor  Continue statin at  discharge  Other Stroke Risk Factors  Advanced age  Obesity, Body mass index is 30.6 kg/m.   Other Active Problems  Behavior outburst - 2/2 TBI- on ativan 1mg  Q6h for agitation  Imbalance on walking - PT/OT  Hospital day # 2   Rosalin Hawking, MD PhD Stroke Neurology 09/05/2017 11:26 PM    To contact Stroke Continuity provider, please refer to [email or web address] After hours, contact General Neurology

## 2017-09-05 NOTE — Progress Notes (Signed)
Physical Therapy Treatment Patient Details Name: Stephen Buckley MRN: [id number] DOB: 02-06-1951 Today's Date: 09/05/2017    History of Present Illness 67 y.o. male admitted for SOB, dizziness, and sudden inability to walk or sit upright. tPA administered. CT negative for acute findings. Pending MRI results at this time. PMH significant for TBI (2013), seizures, HTN, hepatitis, asthma, anemia, and acid reflux.    PT Comments    Pt is up to walk with PT using HHA and pt held IV pole with RUE.  He is navigating well on the hall, noted his avoidance of obstacles with minor vc's.  He dropped to 90% O2 sat with the effort to walk and returned to 95% in a short rest, and reported to MD.  Plan remains to go to rehab and will continue to see pt acutely for progression of balance, gait and exercises as he is able.   Follow Up Recommendations  CIR     Equipment Recommendations  None recommended by PT(deferred to CIR)    Recommendations for Other Services Rehab consult     Precautions / Restrictions Precautions Precautions: Fall Restrictions Weight Bearing Restrictions: No    Mobility  Bed Mobility Overal bed mobility: Needs Assistance Bed Mobility: Supine to Sit     Supine to sit: Supervision Sit to supine: Supervision      Transfers Overall transfer level: Needs assistance Equipment used: 1 person hand held assist(IV pole) Transfers: Sit to/from Stand Sit to Stand: Supervision            Ambulation/Gait Ambulation/Gait assistance: Min assist;Min guard(IV pole) Ambulation Distance (Feet): 110 Feet Assistive device: 1 person hand held assist(IV pole) Gait Pattern/deviations: Step-through pattern;Wide base of support;Shuffle;Decreased stride length;Drifts right/left(turns to R preferentially) Gait velocity: decreased Gait velocity interpretation: Below normal speed for age/gender General Gait Details: milder incoordination with pt steering the IV pole  well   Stairs            Wheelchair Mobility    Modified Rankin (Stroke Patients Only) Modified Rankin (Stroke Patients Only) Pre-Morbid Rankin Score: Slight disability Modified Rankin: Moderate disability     Balance Overall balance assessment: Needs assistance Sitting-balance support: Feet supported Sitting balance-Leahy Scale: Good     Standing balance support: Bilateral upper extremity supported;During functional activity Standing balance-Leahy Scale: Fair Standing balance comment: less than fair dynamically                            Cognition Arousal/Alertness: Awake/alert Behavior During Therapy: Impulsive Overall Cognitive Status: Impaired/Different from baseline Area of Impairment: Safety/judgement;Awareness;Problem solving                   Current Attention Level: Selective Memory: Decreased recall of precautions Following Commands: Follows one step commands with increased time Safety/Judgement: Decreased awareness of safety;Decreased awareness of deficits Awareness: Intellectual Problem Solving: Slow processing;Requires verbal cues General Comments: pt was asked 4 times to lie on bed at end of session      Exercises General Exercises - Lower Extremity Long Arc Quad: Strengthening;Both;10 reps    General Comments General comments (skin integrity, edema, etc.): O2 sats dropped with longer gait to 90% then back to 95% all on room air      Pertinent Vitals/Pain Pain Assessment: No/denies pain    Home Living     Available Help at Discharge: Other (Comment)(spouse) Type of Home: Apartment              Prior  Function            PT Goals (current goals can now be found in the care plan section) Acute Rehab PT Goals Patient Stated Goal: get home with wife Progress towards PT goals: Progressing toward goals    Frequency    Min 4X/week      PT Plan Current plan remains appropriate    Co-evaluation               AM-PAC PT "6 Clicks" Daily Activity  Outcome Measure  Difficulty turning over in bed (including adjusting bedclothes, sheets and blankets)?: A Little Difficulty moving from lying on back to sitting on the side of the bed? : A Little Difficulty sitting down on and standing up from a chair with arms (e.g., wheelchair, bedside commode, etc,.)?: Unable Help needed moving to and from a bed to chair (including a wheelchair)?: A Little Help needed walking in hospital room?: A Little Help needed climbing 3-5 steps with a railing? : A Lot 6 Click Score: 15    End of Session Equipment Utilized During Treatment: Gait belt Activity Tolerance: Patient tolerated treatment well Patient left: in bed;with call bell/phone within reach;with family/visitor present;with nursing/sitter in room(MD there to discuss progress with pt) Nurse Communication: Mobility status PT Visit Diagnosis: Unsteadiness on feet (R26.81);Ataxic gait (R26.0);Other abnormalities of gait and mobility (R26.89);Other symptoms and signs involving the nervous system (R29.898)     Time: 4562-5638 PT Time Calculation (min) (ACUTE ONLY): 29 min  Charges:  $Gait Training: 8-22 mins $Therapeutic Exercise: 8-22 mins                    G Codes:  Functional Assessment Tool Used: AM-PAC 6 Clicks Basic Mobility     Ramond Dial 09/05/2017, 2:21 PM   Mee Hives, PT MS Acute Rehab Dept. Number: [place] [facility] [place]

## 2017-09-05 NOTE — Plan of Care (Signed)
Patient with increased agitation and confusion since yesterday requiring restraints, Haldol, and Ativan. Patient now resting comfortably in bed with posey waist belt on. Will continue to monitor.

## 2017-09-05 NOTE — Progress Notes (Signed)
Occupational Therapy Treatment Patient Details Name: Stephen Buckley MRN: [id number] DOB: 26-Jun-1950 Today's Date: 09/05/2017    History of present illness 67 y.o. male admitted for SOB, dizziness, and sudden inability to walk or sit upright. tPA administered. CT negative for acute findings. Pending MRI results at this time. PMH significant for TBI (2013), seizures, HTN, hepatitis, asthma, anemia, and acid reflux.   OT comments  Pt currently min assist level for toilet transfers, grooming at the sink, and short distance functional mobility.  Per pt's spouse this is much better than yesterday.  He demonstrated no agitation during session with good ability to follow all one and 2 step commands given.  He still exhibits decreased awareness of place but was able to state "[place]".    Follow Up Recommendations  CIR;Supervision/Assistance - 24 hour    Equipment Recommendations  Other (comment)(Defer to next venue)    Recommendations for Other Services Rehab consult    Precautions / Restrictions Precautions Precautions: Fall Restrictions Weight Bearing Restrictions: No       Mobility Bed Mobility Overal bed mobility: Needs Assistance Bed Mobility: Supine to Sit     Supine to sit: Supervision Sit to supine: Supervision   General bed mobility comments: HOB elevated 25 degrees  Transfers Overall transfer level: Needs assistance   Transfers: Sit to/from Stand Sit to Stand: Min assist         General transfer comment: Pt able to ambulate with min assist throughout session.  Increased LOB to the left noted on one occasion.    Balance Overall balance assessment: Needs assistance   Sitting balance-Leahy Scale: Good     Standing balance support: Bilateral upper extremity supported;During functional activity Standing balance-Leahy Scale: Poor Standing balance comment: Min assist for standing balance and mobility.                           ADL either  performed or assessed with clinical judgement   ADL Overall ADL's : Needs assistance/impaired Eating/Feeding: Set up   Grooming: Wash/dry hands;Oral care;Minimal assistance Grooming Details (indicate cue type and reason): Min assist standing at the sink                 Toilet Transfer: Minimal assistance;Regular Toilet;Grab bars Toilet Transfer Details (indicate cue type and reason): Pt ambulated to the sink with min assist and stood to urinate with close supervision and no assistive device.  Toileting- Clothing Manipulation and Hygiene: Minimal assistance Toileting - Clothing Manipulation Details (indicate cue type and reason): Min assist to manage gown.     Functional mobility during ADLs: Minimal assistance General ADL Comments: Pt ambulated to and from the bathroom as well as down to the end of the hall during session with min assist and no assistive device.  Still with decreased dynamic standing balance.     Vision Baseline Vision/History: Wears glasses Wears Glasses: At all times Patient Visual Report: Blurring of vision;Diplopia Additional Comments: Pt with no double vision this session per report.  Demonstrated 100% accuracy with identifying number of fingers therapist was holding up in each quadrant.            Cognition Arousal/Alertness: Lethargic;Suspect due to medications Behavior During Therapy: Flat affect Overall Cognitive Status: Impaired/Different from baseline Area of Impairment: Orientation;Attention                 Orientation Level: Place;Time;Situation Current Attention Level: Sustained   Following Commands: Follows one step commands consistently  Safety/Judgement: Decreased awareness of safety;Decreased awareness of deficits Awareness: Intellectual Problem Solving: Slow processing;Difficulty sequencing General Comments: Pt asleep to begin session with sitter and pt's spouse present.  He needed increased time to wake up but then consistently  followed one step commands related to grooming, toileting, and functional mobility.  No agitation at any point.  Pt pleasant and cooperative.                    Pertinent Vitals/ Pain       Pain Assessment: No/denies pain         Frequency  Min 3X/week        Progress Toward Goals  OT Goals(current goals can now be found in the care plan section)  Progress towards OT goals: Progressing toward goals     Plan Discharge plan remains appropriate       AM-PAC PT "6 Clicks" Daily Activity     Outcome Measure   Help from another person eating meals?: None Help from another person taking care of personal grooming?: A Little Help from another person toileting, which includes using toliet, bedpan, or urinal?: A Little Help from another person bathing (including washing, rinsing, drying)?: A Little Help from another person to put on and taking off regular upper body clothing?: None Help from another person to put on and taking off regular lower body clothing?: A Little 6 Click Score: 20    End of Session Equipment Utilized During Treatment: Gait belt  OT Visit Diagnosis: Unsteadiness on feet (R26.81);Other abnormalities of gait and mobility (R26.89);Muscle weakness (generalized) (M62.81);Low vision, both eyes (H54.2);Other symptoms and signs involving cognitive function   Activity Tolerance Patient tolerated treatment well   Patient Left in bed;with call bell/phone within reach;with family/visitor present;with nursing/sitter in room   Nurse Communication Mobility status        Time: 8676-7209 OT Time Calculation (min): 28 min  Charges: OT General Charges $OT Visit: 1 Visit OT Treatments $Self Care/Home Management : 23-37 mins     Carlus Stay OTR/L 09/05/2017, 9:56 AM

## 2017-09-05 NOTE — Care Management Note (Signed)
Case Management Note  Patient Details  Name: Stephen Buckley MRN: [id number] Date of Birth: [birth year]  Subjective/Objective:  67 y.o. male admitted for SOB, dizziness, and sudden inability to walk or sit upright. tPA administered. CT negative for acute findings.  PTA, pt independent, lives at home with spouse.                 Action/Plan: PT/OT recommending CIR and consult in process.  RNCM will follow for discharge planning as pt progresses.    Expected Discharge Date:                  Expected Discharge Plan:  [place]  In-House Referral:     Discharge planning Services  CM Consult  Post Acute Care Choice:    Choice offered to:     DME Arranged:    DME Agency:     HH Arranged:    [place] Agency:     Status of Service:  In process, will continue to follow  If discussed at Long Length of Stay Meetings, dates discussed:    Additional Comments:  Ella Bodo, RN 09/05/2017, 11:58 AM
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
## 2017-09-05 NOTE — Progress Notes (Addendum)
I met with pt's wife in conference room to discuss her goals and expectations for dispo. She is requesting brief CIR admit if insurance will approve. I have discussed with Dr. Naaman Plummer and Dr. Erlinda Hong. I will begin insurance for a possible admit Friday pending insurance approval. [phone]

## 2017-09-05 NOTE — Consult Note (Signed)
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 ([place] Stroke Program Early CT Score) - Ganglionic level infarction (caudate, lentiform nuclei, internal capsule, insula, M1-M3 cortex): 7 - Supraganglionic infarction (M4-M6 cortex): 3 Total score (0-10 with 10 being normal): 10 IMPRESSION: 1. 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 2. No cortically based acute infarct or acute intracranial hemorrhage identified. 3. ASPECTS is 10 (allowing for chronic encephalomalacia). 4. These results were communicated to Dr. Cheral Marker at 2:21 pmon 3/12/2019by text page via the [facility] messaging system. Electronically Signed   By: Genevie Ann M.D.   On: 09/03/2017 14:23    Assessment/Plan: Diagnosis: Encephalopathy, post-dramatic dementia 1. Does the need for close, 24 hr/day medical supervision in concert with the patient's rehab needs make it unreasonable for this patient to be served in a less intensive setting?  Yes/potentially 2. Co-Morbidities requiring supervision/potential complications: HTN, mood lability d/t TBI 3. Due to bladder management, bowel management, safety, skin/wound care, disease management, medication administration, pain management and patient education, does the patient require 24 hr/day rehab nursing? Yes and Potentially 4. Does the patient require coordinated care of a physician, rehab nurse, PT (1-2 hrs/day, 5 days/week), OT (1-2 hrs/day, 5 days/week) and SLP (1-2 hrs/day, 5 days/week) to address physical and functional deficits in the context of the above medical diagnosis(es)? Potentially Addressing deficits in the following areas: balance, endurance, locomotion, strength, transferring, bowel/bladder control, bathing, dressing, feeding, grooming, toileting, cognition and psychosocial support 5. Can the patient actively participate in an intensive therapy program of at least 3 hrs of therapy per day at least 5 days per week? Potentially 6. The potential for patient to make measurable gains while on inpatient rehab is good and fair 7. Anticipated functional outcomes upon discharge from inpatient rehab are modified independent and supervision  with PT, modified independent and supervision with OT, modified independent and supervision with SLP. 8. Estimated rehab  length of stay to reach the above functional goals is: TBD 9. Anticipated D/C setting: Home 10. Anticipated post D/C treatments: [place] therapy 11. Overall Rehab/Functional Prognosis: excellent  RECOMMENDATIONS: This patient's condition is appropriate for continued rehabilitative care in the following setting: CIR vs Home health Patient has agreed to participate in recommended program. n/a Note that insurance prior authorization may be required for reimbursement for recommended care.  Comment: I last saw this patient in 2014. 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 He had significant behavioral issues  after injury. Wife reports a steady cognitive decline over the last few years. He had a 2 month hx of headaches and dizziness leading up to this admission. 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   Rehab Admissions Coordinator to follow up.  Thanks,  Meredith Staggers, MD, Mellody Drown    Lavon Paganini Angiulli, PA-C 09/05/2017

## 2017-09-05 NOTE — Progress Notes (Signed)
PT. trransfered from 4 NICU to 3 W26  Very sleepy as a result of Haldol and ativan given in ICU due to agitation Pt and spouse welcome and oriented to room and staff.  Safety sitter wit pt posey restraint discontinue. Iv infusing no acute distress. RN will continue to monitor.

## 2017-09-06 DIAGNOSIS — G459 Transient cerebral ischemic attack, unspecified: Secondary | ICD-10-CM

## 2017-09-06 DIAGNOSIS — S069X9D Unspecified intracranial injury with loss of consciousness of unspecified duration, subsequent encounter: Secondary | ICD-10-CM

## 2017-09-06 DIAGNOSIS — R4689 Other symptoms and signs involving appearance and behavior: Secondary | ICD-10-CM

## 2017-09-06 DIAGNOSIS — R2990 Unspecified symptoms and signs involving the nervous system: Secondary | ICD-10-CM

## 2017-09-06 DIAGNOSIS — S0990XS Unspecified injury of head, sequela: Secondary | ICD-10-CM

## 2017-09-06 DIAGNOSIS — G40909 Epilepsy, unspecified, not intractable, without status epilepticus: Secondary | ICD-10-CM

## 2017-09-06 DIAGNOSIS — R413 Other amnesia: Secondary | ICD-10-CM

## 2017-09-06 HISTORY — DX: Transient cerebral ischemic attack, unspecified: G45.9

## 2017-09-06 LAB — CBC
HCT: 40.2 % (ref 39.0–52.0)
Hemoglobin: 13.4 g/dL (ref 13.0–17.0)
MCH: 30.1 pg (ref 26.0–34.0)
MCHC: 33.3 g/dL (ref 30.0–36.0)
MCV: 90.3 fL (ref 78.0–100.0)
PLATELETS: 162 10*3/uL (ref 150–400)
RBC: 4.45 MIL/uL (ref 4.22–5.81)
RDW: 13.2 % (ref 11.5–15.5)
WBC: 3.7 10*3/uL — ABNORMAL LOW (ref 4.0–10.5)

## 2017-09-06 LAB — BASIC METABOLIC PANEL
Anion gap: 8 (ref 5–15)
BUN: 5 mg/dL — AB (ref 6–20)
CALCIUM: 9 mg/dL (ref 8.9–10.3)
CO2: 24 mmol/L (ref 22–32)
CREATININE: 0.86 mg/dL (ref 0.61–1.24)
Chloride: 110 mmol/L (ref 101–111)
GFR calc Af Amer: >60 mL/min (ref >60)
GLUCOSE: 103 mg/dL — AB (ref 65–99)
POTASSIUM: 3.8 mmol/L (ref 3.5–5.1)
Sodium: 142 mmol/L (ref 135–145)

## 2017-09-06 LAB — GLUCOSE, CAPILLARY: Glucose-Capillary: 88 mg/dL (ref 65–99)

## 2017-09-06 MED ORDER — ASPIRIN 325 MG PO TBEC: 325.0000 mg | DELAYED_RELEASE_TABLET | Freq: Every day | ORAL | 0 refills | Status: DC

## 2017-09-06 MED ORDER — CARBAMAZEPINE 200 MG PO TABS: ORAL_TABLET | ORAL | 2 refills | Status: DC
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
## 2017-09-06 NOTE — Plan of Care (Signed)
  Completed/[facility] Behavior/Discharge Planning: Ability to manage health-related needs will improve 09/06/2017 1624 - Completed/Met by Emmaline Life, RN Clinical Measurements: Ability to maintain clinical measurements within normal limits will improve 09/06/2017 1624 - Completed/Met by Emmaline Life, RN Will remain free from infection 09/06/2017 1624 - Completed/Met by Emmaline Life, RN Coping: Level of anxiety will decrease 09/06/2017 1624 - Completed/Met by Emmaline Life, RN Elimination: Will not experience complications related to bowel motility 09/06/2017 1624 - Completed/Met by Emmaline Life, RN Will not experience complications related to urinary retention 09/06/2017 1624 - Completed/Met by Emmaline Life, RN Pain Managment: General experience of comfort will improve 09/06/2017 1624 - Completed/Met by Emmaline Life, RN Safety: Ability to remain free from injury will improve 09/06/2017 1624 - Completed/Met by Emmaline Life, RN Skin Integrity: Risk for impaired skin integrity will decrease 09/06/2017 1624 - Completed/Met by Emmaline Life, RN Education: Knowledge of disease or condition will improve 09/06/2017 1624 - Completed/Met by Emmaline Life, RN Knowledge of secondary prevention will improve 09/06/2017 1624 - Completed/Met by Emmaline Life, RN Knowledge of patient specific risk factors addressed and post discharge goals established will improve 09/06/2017 1624 - Completed/Met by Emmaline Life, RN Coping: Will verbalize positive feelings about self 09/06/2017 1624 - Completed/Met by Emmaline Life, RN Will identify appropriate support needs 09/06/2017 1624 - Completed/Met by Emmaline Life, RN Health Behavior/Discharge Planning: Ability to manage health-related needs will improve 09/06/2017 1624 - Completed/Met by Emmaline Life, RN Self-Care: Ability to participate in self-care as condition permits will improve 09/06/2017  1624 - Completed/Met by Emmaline Life, RN Verbalization of feelings and concerns over difficulty with self-care will improve 09/06/2017 1624 - Completed/Met by Emmaline Life, RN Ability to communicate needs accurately will improve 09/06/2017 1624 - Completed/Met by Emmaline Life, RN Nutrition: Risk of aspiration will decrease 09/06/2017 1624 - Completed/Met by Emmaline Life, RN Dietary intake will improve 09/06/2017 1624 - Completed/Met by Emmaline Life, RN Ischemic Stroke/TIA Tissue Perfusion: Complications of ischemic stroke/TIA will be minimized 09/06/2017 1624 - Completed/Met by Emmaline Life, RN

## 2017-09-06 NOTE — Care Management Note (Signed)
Case Management Note  Patient Details  Name: Stephen Buckley MRN: [id number] Date of Birth: [birth year]  Subjective/Objective:                    Action/Plan: Pt with authorization for CIR but doesn't feel they can afford the co pays. CM had met with the patient and his spouse earlier and they were asking for outpatient therapy at [facility] if CIR not an option.  CM placed orders in Epic and information on the AVS. Wife and niece to provide 24 hour supervision at home and transportation home per wife.   Expected Discharge Date:                  Expected Discharge Plan:  [place]  In-House Referral:     Discharge planning Services  CM Consult  Post Acute Care Choice:    Choice offered to:     DME Arranged:    DME Agency:     HH Arranged:    [place] Agency:     Status of Service:  Completed, signed off  If discussed at [employer] of Stay Meetings, dates discussed:    Additional Comments:  Pollie Friar, RN 09/06/2017, 2:41 PM

## 2017-09-06 NOTE — Consult Note (Signed)
[facility] Primary Care Navigator  09/06/2017  Stephen Buckley 11-May-1951 [id number]     Met withpatientand wife Stephen Buckley) at the [facility] identify possible discharge needs. Wife reportsthat patient was having"dizzy spells and slurred speech"thathad led to this admission. Per MD note, Transient Ischemic Attack - could be posterior circulation TIA.  Patientendorses [place] with [place] his primary care provider.   [facility] [place] obtain medications without difficulty.   Patient's wife reportsthat she has been assisting in managing patient's medications at home with use of "pill box" system filled once a week.  Wife verbalized that she has been driving and providing transportation to his doctors'appointments.  Patient reports that he lives at home with wife who is the primary caregiver, and niece will be able to provide assistance with care needs as well.  Anticipateddischarge planishomewith Outpatient rehabilitation according to wife instead of [facility] ([facility]) since they don't feel they can afford the co-pays.   Patient and wifevoiced understanding to call primary care provider's officewhenhereturnshometo schedulea post discharge follow-up visit within1- 2weeksor sooner if needs arise.Patient letter (with PCP's contact number) was provided asareminder.   Discussed with patientand wife regarding [facility] CM services available for health management, butboth deniedanyneeds or concernsat this point, instead, patient and wife opted and verbally agreed forEMMI stroke calls to follow-upwith recovery at home.   Referral made for EMMI stroke calls after discharge.  Patient and wifeexpressed understandingto seekreferral from primary care provider to [facility] care management if deemed necessaryor appropriate for any services in the  future.  [facility] care management information was provided for future needs thatmay arise.   For additional questions please contact:  Stephen Buckley, BSN, RN-BC [facility] PRIMARY CARE Navigator Cell: [phone]

## 2017-09-06 NOTE — Progress Notes (Signed)
Pt's wife called at about 0300 that he is getting agitated, requesting for his ordered Ativan, pt earlier had tab Seroquel 200mg  at 2145, slept on and off and still drowsy, pt has a Air cabin crew at bedside. Wife observed to be upset that pt is getting agitated because he didn't receive his ativan, she was however explained to that pt was earlier calm and oriented, following all command, so he did not need the ativan then. Safety sitter at bedside confirmed pt has been trying to get out of bed, ativan 1mg  giving iv at 0305. Will however continue to monitor. [place]-Asidi, Nthony Lefferts Efe

## 2017-09-06 NOTE — Progress Notes (Signed)
I received insurance approval to admit pt to inpt rehab today, but when I met with wife to discuss her co pays, she states she is unable to afford cost. She wishes to take home with assistance of pt's niece and requests out pt rehab set up. RN CM, SW and Dr. Erlinda Hong. Aware. We will sign off at this time. [phone]

## 2017-09-06 NOTE — Progress Notes (Signed)
Discharge instructions explained and provided to patient and wife verbalized understanding. Patient left floor via wheelchair accompanied by staff. No c/o pain or shortness of breath at d/c.  Child [place]oy, Tivis Ringer, RN

## 2017-09-06 NOTE — NC FL2 (Signed)
[place] LEVEL OF CARE SCREENING TOOL     IDENTIFICATION  Patient Name: Stephen Buckley Birthdate: Dec 25, 1950 Sex: male Admission Date (Current Location): 09/03/2017  [facility] and [place] Number:  [employer] and Address:  [facility], [place] [address] [place], [place] [address]      Provider Number: [id number]  Attending Physician Name and Address:  Rosalin Hawking, MD  Relative Name and Phone Number:       Current Level of Care: Hospital Recommended Level of Care: [place] Prior Approval Number:    Date Approved/Denied:   PASRR Number: [id number]  Discharge Plan: SNF    Current Diagnoses: Patient Active Problem List   Diagnosis Date Noted  . CVA (cerebral vascular accident) ([place]) 09/03/2017  . Seizure disorder ([place]) 11/29/2016  . Memory difficulty 11/01/2016  . History of fall 09/27/2016  . Seasonal allergies 12/30/2015  . Essential hypertension 08/15/2015  . Dyslipidemia 08/15/2015  . Overweight(278.02) 08/28/2013  . Hepatitis C 08/07/2013  . Tobacco use 08/07/2013  . 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 TBI (traumatic brain injury) ([place]) 03/04/2012    Orientation RESPIRATION BLADDER Height & Weight     Self, Situation, Time, Place  Normal Continent Weight: 189 lb 9.5 oz (86 kg) Height:  5\' 6"  (167.6 cm)  BEHAVIORAL SYMPTOMS/MOOD NEUROLOGICAL BOWEL NUTRITION STATUS      Continent Diet(cardiac)  AMBULATORY STATUS COMMUNICATION OF NEEDS Skin   Limited Assist   Normal                       Personal Care Assistance Level of Assistance  Bathing, Dressing Bathing Assistance: Limited assistance   Dressing Assistance: Limited assistance     Functional Limitations Info             SPECIAL CARE FACTORS FREQUENCY  PT (By licensed PT), OT (By licensed OT)     PT Frequency: 5/wk OT Frequency: 5/wk            Contractures      Additional Factors Info  Code Status,  Allergies, Psychotropic Code Status Info: FULL Allergies Info: NKA Psychotropic Info: seroquel         Current Medications (09/06/2017):  This is the current hospital active medication list Current Facility-Administered Medications  Medication Dose Route Frequency Provider Last Rate Last Dose  . acetaminophen (TYLENOL) tablet 650 mg  650 mg Oral Q4H PRN Marliss Coots, PA-C      . aspirin EC tablet 325 mg  325 mg Oral Daily Rosalin Hawking, MD   325 mg at 09/06/17 1123  . atorvastatin (LIPITOR) tablet 40 mg  40 mg Oral QHS Rosalin Hawking, MD   40 mg at 09/05/17 2145  . carbamazepine (TEGRETOL) tablet 200 mg  200 mg Oral Daily Rosalin Hawking, MD   200 mg at 09/06/17 1123  . carbamazepine (TEGRETOL) tablet 300 mg  300 mg Oral QHS Rosalin Hawking, MD   300 mg at 09/05/17 2319  . fluticasone (FLONASE) 50 MCG/ACT nasal spray 2 spray  2 spray Each Nare Daily Rosalin Hawking, MD   2 spray at 09/05/17 1057  . Influenza vac split quadrivalent PF (FLUARIX) injection 0.5 mL  0.5 mL Intramuscular Tomorrow-1000 Aroor, Karena Addison R, MD      . loratadine (CLARITIN) tablet 10 mg  10 mg Oral Daily Rosalin Hawking, MD   10 mg at 09/06/17 1123  . LORazepam (ATIVAN) injection 1 mg  1 mg Intravenous  Q6H PRN Rosalin Hawking, MD   1 mg at 09/06/17 0305  . pantoprazole (PROTONIX) EC tablet 40 mg  40 mg Oral Daily Rosalin Hawking, MD   40 mg at 09/06/17 1123  . pneumococcal 23 valent vaccine (PNU-IMMUNE) injection 0.5 mL  0.5 mL Intramuscular Tomorrow-1000 Aroor, Sushanth R, MD      . QUEtiapine (SEROQUEL) tablet 200 mg  200 mg Oral QHS Marliss Coots, PA-C   200 mg at 09/05/17 2145     Discharge Medications: Please see discharge summary for a list of discharge medications.  Relevant Imaging Results:  Relevant Lab Results:   Additional Information SS#: [id number]  Jorge Ny, LCSW

## 2017-09-09 NOTE — Addendum Note (Signed)
Addended byCalla Kicks T on: 09/09/2017 11:19 AM   Modules accepted: Level of Service, SmartSet

## 2017-09-09 NOTE — Progress Notes (Signed)
This encounter was created in error - please disregard.

## 2017-09-16 ENCOUNTER — Telehealth: Payer: Self-pay

## 2017-09-16 NOTE — Telephone Encounter (Signed)
Pt called nurse line, states he was told to call Dr. Burr Medico if she did not get in touch with him following recent stroke.  Pt call back [phone] Wallace Cullens, RN

## 2017-09-16 NOTE — Telephone Encounter (Signed)
Patient has an appointment scheduled for 3/27.

## 2017-09-18 ENCOUNTER — Encounter: Payer: Self-pay | Admitting: Student in an Organized Health Care Education/Training Program

## 2017-09-18 ENCOUNTER — Other Ambulatory Visit: Payer: Self-pay

## 2017-09-18 ENCOUNTER — Ambulatory Visit (INDEPENDENT_AMBULATORY_CARE_PROVIDER_SITE_OTHER): Payer: Medicare Other | Admitting: Student in an Organized Health Care Education/Training Program

## 2017-09-18 VITALS — BP 134/74 | HR 98 | Temp 98.2°F | Ht 69.0 in | Wt 199.6 lb

## 2017-09-18 DIAGNOSIS — G459 Transient cerebral ischemic attack, unspecified: Secondary | ICD-10-CM | POA: Diagnosis not present

## 2017-09-18 DIAGNOSIS — R413 Other amnesia: Secondary | ICD-10-CM

## 2017-09-18 DIAGNOSIS — Z09 Encounter for follow-up examination after completed treatment for conditions other than malignant neoplasm: Secondary | ICD-10-CM

## 2017-09-18 NOTE — Progress Notes (Signed)
  HPI:  Stephen Buckley presents for hospital follow up. Patient was hospitalized from 3/12 to 09/06/2017 with a TIA.   Patient presented to hospital with dysarthria, difficulty walking and room spinning. He was found to have dysmetrial in bil UE and LE in the ED, also had wide-based gait and could not follow directions.  His head CT was negative for acute hemorrhagic stroke and MRI did not show ischemic stroke. 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  He was discharged on ASA 325 daily, lipitor 40 mg daily. Plan was for OP PT and OT. He has follow up with neurology on 09/30/2017.  Patient presents with his wife and now reports TIA symptoms have improved.  He continues to take aspirin and Lipitor.  He did not follow-up with outpatient PT and OT, and it did not go to CIR because they were unable to afford it.    Medical Decision making capacity His wife has questions about the cold decision making capacity, which apparently was discussed with her in the hospital.  ROS: See HPI.  [place]: Reviewed  PHYSICAL EXAM: BP 134/74   Pulse 98   Temp 98.2 F (36.8 C) (Oral)   Ht 5\' 9"  (1.753 m)   Wt 199 lb 9.6 oz (90.5 kg)   SpO2 96%   BMI 29.48 kg/m  Gen: NAD, comfortable HEENT: NCAT, EOMI, PERRL, MMM, no pharyngeal erythema or exudate Heart: RRR, no M/R/G Lungs: CTA Bil Neuro: CN II through XII grossly intact, normal gait with a cane, no apparent balance issues or ataxia Ext: No gross abnormalities    TTE - LVEF 60-65%, mild LVH, normal wall motion, borderline diastolicdysfunction, trivial MR, normal LA size, normal IVC.   ASSESSMENT/PLAN:  Possible posterior TIA (transient ischemic attack) Wife is at bedside and reports the patient is doing well.  He continues to take his medications as prescribed.  Encouraged him to follow-up with physical therapy since they were not able to go to CIR.  Additionally  encouraged him to follow-up with neurology as planned.  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  His wife asks about decision-making capacity, which I think would be appropriate for him to be formally evaluated by psychiatry.  Based on my interactions with the patient in the office when his wife is not with him, I suspect he may not have capacity due to memory issues.  This is complicated because according to his wife he frequently argues with his wife and tries to shut her out of his care once they leave the office, however he does rely on her to remember his medications daily.  I encouraged them to make an appointment with psychiatry for this evaluation.  I discussed the possibility of skilled nursing facility with his wife, however she feels that she is able to handle his care at this time.  Everrett Coombe, MD, [place]

## 2017-09-18 NOTE — Assessment & Plan Note (Signed)
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  His wife asks about decision-making capacity, which I think would be appropriate for him to be formally evaluated by psychiatry.  Based on my interactions with the patient in the office when his wife is not with him, I suspect he may not have capacity due to memory issues.  This is complicated because according to his wife he frequently argues with his wife and tries to shut her out of his care once they leave the office, however he does rely on her to remember his medications daily.  I encouraged them to make an appointment with psychiatry for this evaluation.  I discussed the possibility of skilled nursing facility with his wife, however she feels that she is able to handle his care at this time.

## 2017-09-18 NOTE — Assessment & Plan Note (Signed)
Wife is at bedside and reports the patient is doing well.  He continues to take his medications as prescribed.  Encouraged him to follow-up with physical therapy since they were not able to go to CIR.  Additionally encouraged him to follow-up with neurology as planned.

## 2017-09-18 NOTE — Patient Instructions (Signed)
It was a pleasure seeing you today in our clinic.  Here is the treatment plan we have discussed and agreed upon together:  I think it is a good idea to ask your psychiatrist to assess for medical decision making capacity.  Our clinic's number is [phone]. Please call with questions or concerns about what we discussed today.  Be well, Dr. Burr Medico  Sign up for My Chart to have easy access to your labs results, and communication with your primary care physician.

## 2017-09-20 ENCOUNTER — Ambulatory Visit (INDEPENDENT_AMBULATORY_CARE_PROVIDER_SITE_OTHER): Payer: Medicare Other | Admitting: Psychiatry

## 2017-09-20 ENCOUNTER — Encounter (HOSPITAL_COMMUNITY): Payer: Self-pay | Admitting: Psychiatry

## 2017-09-20 DIAGNOSIS — R413 Other amnesia: Secondary | ICD-10-CM | POA: Diagnosis not present

## 2017-09-20 DIAGNOSIS — F17210 Nicotine dependence, cigarettes, uncomplicated: Secondary | ICD-10-CM

## 2017-09-20 DIAGNOSIS — Z811 Family history of alcohol abuse and dependence: Secondary | ICD-10-CM

## 2017-09-20 DIAGNOSIS — F319 Bipolar disorder, unspecified: Secondary | ICD-10-CM

## 2017-09-20 MED ORDER — QUETIAPINE FUMARATE 200 MG PO TABS: 200.0000 mg | ORAL_TABLET | Freq: Every day | ORAL | 0 refills | Status: DC
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
## 2017-09-30 ENCOUNTER — Ambulatory Visit: Payer: Medicare Other | Admitting: Adult Health

## 2017-09-30 ENCOUNTER — Encounter: Payer: Self-pay | Admitting: Adult Health

## 2017-09-30 VITALS — BP 142/85 | HR 89 | Ht 69.0 in | Wt 198.0 lb

## 2017-09-30 DIAGNOSIS — G459 Transient cerebral ischemic attack, unspecified: Secondary | ICD-10-CM | POA: Diagnosis not present

## 2017-09-30 DIAGNOSIS — R413 Other amnesia: Secondary | ICD-10-CM

## 2017-09-30 DIAGNOSIS — R569 Unspecified convulsions: Secondary | ICD-10-CM | POA: Diagnosis not present
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
## 2017-09-30 NOTE — Progress Notes (Signed)
I have read the note, and I agree with the clinical assessment and plan.  [place] K Willis   

## 2017-09-30 NOTE — Patient Instructions (Addendum)
Your Plan:  Continue Carbamazepine 200 mg in the morning and 300 mg at bedtime Blood work today Referral for memory testing Continue Aspirin Blood Pressure goal <130/90 Cholesterol LDL <70 If your symptoms worsen or you develop new symptoms please let us know.   Thank you for coming to see [place] at [facility]. I hope we have been able to provide you high quality care today.  You may receive a patient satisfaction survey over the next few weeks. We would appreciate your feedback and comments so that we may continue to improve ourselves and the health of our patients.

## 2017-10-01 LAB — COMPREHENSIVE METABOLIC PANEL
ALT: 22 IU/L (ref 0–44)
AST: 19 IU/L (ref 0–40)
Albumin/Globulin Ratio: 1.8 (ref 1.2–2.2)
Albumin: 4.4 g/dL (ref 3.6–4.8)
Alkaline Phosphatase: 99 IU/L (ref 39–117)
BUN/Creatinine Ratio: 9 — ABNORMAL LOW (ref 10–24)
BUN: 7 mg/dL — ABNORMAL LOW (ref 8–27)
Bilirubin Total: <0.2 mg/dL (ref 0.0–1.2)
CO2: 21 mmol/L (ref 20–29)
Calcium: 9.2 mg/dL (ref 8.6–10.2)
Chloride: 107 mmol/L — ABNORMAL HIGH (ref 96–106)
Creatinine, Ser: 0.81 mg/dL (ref 0.76–1.27)
GFR calc Af Amer: 109 mL/min/{1.73_m2} (ref >59)
GFR calc non Af Amer: 94 mL/min/{1.73_m2} (ref >59)
Globulin, Total: 2.5 g/dL (ref 1.5–4.5)
Glucose: 90 mg/dL (ref 65–99)
Potassium: 4.1 mmol/L (ref 3.5–5.2)
Sodium: 144 mmol/L (ref 134–144)
Total Protein: 6.9 g/dL (ref 6.0–8.5)

## 2017-10-01 LAB — CBC WITH DIFFERENTIAL/PLATELET
Basophils Absolute: 0 10*3/uL (ref 0.0–0.2)
Basos: 0 %
EOS (ABSOLUTE): 0.1 10*3/uL (ref 0.0–0.4)
Eos: 2 %
Hematocrit: 43.4 % (ref 37.5–51.0)
Hemoglobin: 14.3 g/dL (ref 13.0–17.7)
Immature Grans (Abs): 0 10*3/uL (ref 0.0–0.1)
Immature Granulocytes: 0 %
Lymphocytes Absolute: 1.5 10*3/uL (ref 0.7–3.1)
Lymphs: 37 %
MCH: 30.6 pg (ref 26.6–33.0)
MCHC: 32.9 g/dL (ref 31.5–35.7)
MCV: 93 fL (ref 79–97)
Monocytes Absolute: 0.5 10*3/uL (ref 0.1–0.9)
Monocytes: 12 %
Neutrophils Absolute: 2 10*3/uL (ref 1.4–7.0)
Neutrophils: 49 %
Platelets: 205 10*3/uL (ref 150–379)
RBC: 4.68 x10E6/uL (ref 4.14–5.80)
RDW: 14.1 % (ref 12.3–15.4)
WBC: 4.1 10*3/uL (ref 3.4–10.8)

## 2017-10-01 LAB — CARBAMAZEPINE LEVEL, TOTAL: Carbamazepine (Tegretol), S: 9.3 ug/mL (ref 4.0–12.0)

## 2017-10-02 ENCOUNTER — Telehealth: Payer: Self-pay

## 2017-10-02 NOTE — Telephone Encounter (Signed)
-----   Message from Ward Givens, NP sent at 10/02/2017  3:43 PM EDT ----- Patient's blood work is relatively unremarkable.  Carbamazepine is in therapeutic range.  I do not recommend increasing carbamazepine at this time.  He should discuss with his psychiatrist about behavioral outburst.

## 2017-10-02 NOTE — Telephone Encounter (Signed)
I called pt. I advised him of his lab results. Pt reports he got a phone call about his memory on Monday. I reviewed pt's chart, it appears that Jinny Blossom, NP referred pt to neuro psych for memory testing. Pt believes it was that office that called him. He was unable to take a message from that call. He is asking that office to call him back, will send a message to [place]. Pt verbalized understanding of results. Pt had no further  questions at this time but was encouraged to call back if questions arise.

## 2017-10-02 NOTE — Telephone Encounter (Addendum)
Called and spoke to patient he relayed he was trying to say he needs address to Dr. Tawny Asal. Patient was in the Hospital with his wife and he asked me to call Him back  10/03/2017. With address and telephone number.   Address: [address], [place], [place] [address]  [phone].

## 2017-10-03 NOTE — Telephone Encounter (Signed)
Spoke to patient's wife and she is going to make sure patient keep's his apt.

## 2017-10-08 ENCOUNTER — Encounter: Payer: Self-pay | Admitting: Psychology

## 2017-11-12 ENCOUNTER — Ambulatory Visit (HOSPITAL_COMMUNITY): Payer: Self-pay | Admitting: Psychiatry

## 2017-11-13 ENCOUNTER — Ambulatory Visit (HOSPITAL_COMMUNITY): Payer: Self-pay | Admitting: Psychiatry

## 2017-11-16 ENCOUNTER — Other Ambulatory Visit: Payer: Self-pay | Admitting: Student in an Organized Health Care Education/Training Program

## 2017-11-16 DIAGNOSIS — E785 Hyperlipidemia, unspecified: Secondary | ICD-10-CM

## 2017-11-29 ENCOUNTER — Other Ambulatory Visit: Payer: Self-pay

## 2017-12-02 ENCOUNTER — Other Ambulatory Visit: Payer: Self-pay

## 2017-12-02 NOTE — Patient Outreach (Signed)
Telephone outreach to patient to obtain mRS was successfully completed. mRS = 0 

## 2017-12-16 ENCOUNTER — Ambulatory Visit (HOSPITAL_COMMUNITY): Payer: Medicare Other | Admitting: Psychiatry

## 2018-01-21 ENCOUNTER — Other Ambulatory Visit: Payer: Self-pay | Admitting: Neurology

## 2018-01-24 DIAGNOSIS — H538 Other visual disturbances: Secondary | ICD-10-CM | POA: Diagnosis not present

## 2018-01-24 DIAGNOSIS — H02115 Cicatricial ectropion of left lower eyelid: Secondary | ICD-10-CM | POA: Diagnosis not present

## 2018-01-24 DIAGNOSIS — H1011 Acute atopic conjunctivitis, right eye: Secondary | ICD-10-CM | POA: Diagnosis not present

## 2018-01-24 DIAGNOSIS — H2511 Age-related nuclear cataract, right eye: Secondary | ICD-10-CM | POA: Diagnosis not present

## 2018-01-28 ENCOUNTER — Telehealth: Payer: Self-pay

## 2018-01-28 NOTE — Telephone Encounter (Signed)
Pharmacy LVM on nurse line stating patients Epitol is on back order and the generic will have to be sent in, Carbamazepine 200mg . Per chart review, I saw where an NP had originally sent in this rx from another office. Please advise.

## 2018-01-29 ENCOUNTER — Telehealth: Payer: Self-pay | Admitting: Adult Health

## 2018-01-29 ENCOUNTER — Other Ambulatory Visit (HOSPITAL_COMMUNITY): Payer: Self-pay

## 2018-01-29 DIAGNOSIS — F319 Bipolar disorder, unspecified: Secondary | ICD-10-CM

## 2018-01-29 MED ORDER — CARBAMAZEPINE 200 MG PO TABS: ORAL_TABLET | ORAL | 0 refills | Status: DC

## 2018-01-29 MED ORDER — QUETIAPINE FUMARATE 200 MG PO TABS: 200.0000 mg | ORAL_TABLET | Freq: Every day | ORAL | 0 refills | Status: DC

## 2018-01-29 NOTE — Telephone Encounter (Signed)
[facility] needs approval to switch manufacture for carbamazepine (EPITOL) 200 MG tablet, medication is on backorder. Please call to advise at [phone]

## 2018-01-29 NOTE — Telephone Encounter (Signed)
I called pt, spoke to wife and let him know that it ws ok per MM/NP to switch carbamazepine to another manufacturer.  There is slight risk for seizure when changing but wanted him to be aware.  She would relay that to pt.  He is to take 200mg  po am and 300mg  po PM.  I relayed this to [place] pharmacist at [facility].

## 2018-01-29 NOTE — Telephone Encounter (Signed)
That is fine.  Please advise patient that they are switching the manufacturer.  This could increase his risk for having a seizure but this is the only option at this time.

## 2018-01-30 NOTE — Telephone Encounter (Signed)
The medication should be sent by the prescriber who is not at this office.

## 2018-02-27 ENCOUNTER — Ambulatory Visit (HOSPITAL_COMMUNITY): Payer: Self-pay | Admitting: Psychiatry

## 2018-03-10 ENCOUNTER — Encounter: Payer: Self-pay | Admitting: Psychology

## 2018-03-10 ENCOUNTER — Ambulatory Visit: Payer: Medicare Other | Admitting: Psychology

## 2018-03-10 ENCOUNTER — Ambulatory Visit (INDEPENDENT_AMBULATORY_CARE_PROVIDER_SITE_OTHER): Payer: Medicare Other | Admitting: Psychology

## 2018-03-10 DIAGNOSIS — G40909 Epilepsy, unspecified, not intractable, without status epilepticus: Secondary | ICD-10-CM

## 2018-03-10 DIAGNOSIS — S069X9S Unspecified intracranial injury with loss of consciousness of unspecified duration, sequela: Secondary | ICD-10-CM | POA: Diagnosis not present

## 2018-03-10 DIAGNOSIS — R413 Other amnesia: Secondary | ICD-10-CM

## 2018-03-10 DIAGNOSIS — F319 Bipolar disorder, unspecified: Secondary | ICD-10-CM

## 2018-03-10 NOTE — Progress Notes (Signed)
   Neuropsychology Note  Stephen Buckley completed 60 minutes of neuropsychological testing with technician, Milana Kidney, BS, under the supervision of Dr. Macarthur Critchley, Licensed Psychologist. The patient did not appear overtly distressed by the testing session, per behavioral observation or via self-report to the technician. Rest breaks were offered.   Clinical Decision Making: In considering the patient's current level of functioning, level of presumed impairment, nature of symptoms, emotional and behavioral responses during the interview, level of literacy, and observed level of motivation/effort, a battery of tests was selected and communicated to the psychometrician.  Communication between the psychologist and technician was ongoing throughout the testing session and changes were made as deemed necessary based on patient performance on testing, technician observations and additional pertinent factors such as those listed above.  Stephen Buckley will return within approximately 2 weeks for an interactive feedback session with Dr. Si Raider at which time his test performances, clinical impressions and treatment recommendations will be reviewed in detail. The patient understands he can contact our office should he require our assistance before this time.  35 minutes spent performing neuropsychological evaluation services/clinical decision making (psychologist). [CPT 38453] 60 minutes spent face-to-face with patient administering standardized tests, 30 minutes spent scoring (technician). [CPT [id number], 64680]  Full report to follow.
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
## 2018-03-17 ENCOUNTER — Other Ambulatory Visit: Payer: Self-pay | Admitting: Student in an Organized Health Care Education/Training Program
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
## 2018-03-27 ENCOUNTER — Encounter: Payer: Self-pay | Admitting: Psychology

## 2018-03-27 ENCOUNTER — Ambulatory Visit (INDEPENDENT_AMBULATORY_CARE_PROVIDER_SITE_OTHER): Payer: Medicare Other | Admitting: Psychology

## 2018-03-27 DIAGNOSIS — F0281 Dementia in other diseases classified elsewhere with behavioral disturbance: Secondary | ICD-10-CM | POA: Diagnosis not present

## 2018-03-27 DIAGNOSIS — S069X9S Unspecified intracranial injury with loss of consciousness of unspecified duration, sequela: Secondary | ICD-10-CM

## 2018-03-28 ENCOUNTER — Telehealth: Payer: Self-pay | Admitting: Psychology

## 2018-03-28 NOTE — Telephone Encounter (Signed)
Please call Stephen Buckley she would like to ask a couple of questions

## 2018-03-31 NOTE — Telephone Encounter (Signed)
Spoke with patient's wife, [name]. She informed me patient is going to go stay with his niece for a while and she is going to manage his medication and ensure he gets it for a period of time so they can assess if his behavior improves with medication compliance. [name] wanted to know what they should do if he "starts acting out of control". I advised that they can always call 911 or take him to the ER. [name] appreciative of the call.

## 2018-04-03 ENCOUNTER — Encounter: Payer: Self-pay | Admitting: Psychology

## 2018-04-08 ENCOUNTER — Other Ambulatory Visit (HOSPITAL_COMMUNITY): Payer: Self-pay | Admitting: Psychiatry

## 2018-04-10 ENCOUNTER — Other Ambulatory Visit (HOSPITAL_COMMUNITY): Payer: Self-pay

## 2018-04-10 MED ORDER — CARBAMAZEPINE 200 MG PO TABS: ORAL_TABLET | ORAL | 0 refills | Status: DC

## 2018-04-17 ENCOUNTER — Ambulatory Visit: Payer: Medicare Other | Admitting: Neurology

## 2018-04-22 ENCOUNTER — Ambulatory Visit: Payer: Medicare Other | Admitting: Neurology

## 2018-05-14 ENCOUNTER — Other Ambulatory Visit (HOSPITAL_COMMUNITY): Payer: Self-pay | Admitting: Psychiatry

## 2018-05-14 ENCOUNTER — Ambulatory Visit (HOSPITAL_COMMUNITY): Payer: Medicare Other | Admitting: Psychiatry

## 2018-05-19 ENCOUNTER — Other Ambulatory Visit (HOSPITAL_COMMUNITY): Payer: Self-pay

## 2018-05-19 MED ORDER — CARBAMAZEPINE 200 MG PO TABS: ORAL_TABLET | ORAL | 0 refills | Status: DC

## 2018-05-30 ENCOUNTER — Other Ambulatory Visit: Payer: Self-pay | Admitting: Neurology

## 2018-05-30 ENCOUNTER — Other Ambulatory Visit: Payer: Self-pay | Admitting: Student in an Organized Health Care Education/Training Program

## 2018-05-30 DIAGNOSIS — J302 Other seasonal allergic rhinitis: Secondary | ICD-10-CM

## 2018-06-03 ENCOUNTER — Telehealth: Payer: Self-pay | Admitting: Psychology

## 2018-06-03 NOTE — Telephone Encounter (Signed)
Spoke with patient's wife regarding letter that was mailed (Dr. Si Raider leaving). Wife would prefer to wait on Dr. Marcia Brash replacement. Would prefer not to go elsewhere.

## 2018-06-04 ENCOUNTER — Ambulatory Visit (INDEPENDENT_AMBULATORY_CARE_PROVIDER_SITE_OTHER): Payer: Medicare Other | Admitting: Psychiatry

## 2018-06-04 ENCOUNTER — Encounter (HOSPITAL_COMMUNITY): Payer: Self-pay | Admitting: Psychiatry

## 2018-06-04 VITALS — BP 126/78 | HR 64 | Ht 69.0 in | Wt 190.0 lb

## 2018-06-04 DIAGNOSIS — F09 Unspecified mental disorder due to known physiological condition: Secondary | ICD-10-CM | POA: Diagnosis not present

## 2018-06-04 DIAGNOSIS — F319 Bipolar disorder, unspecified: Secondary | ICD-10-CM | POA: Diagnosis not present

## 2018-06-04 MED ORDER — QUETIAPINE FUMARATE 200 MG PO TABS: 200.0000 mg | ORAL_TABLET | Freq: Every day | ORAL | 0 refills | Status: DC

## 2018-06-04 MED ORDER — CARBAMAZEPINE 200 MG PO TABS: ORAL_TABLET | ORAL | 2 refills | Status: DC
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
## 2018-07-07 ENCOUNTER — Ambulatory Visit: Payer: Medicare Other | Admitting: Student in an Organized Health Care Education/Training Program

## 2018-07-21 ENCOUNTER — Ambulatory Visit: Payer: Medicare Other | Admitting: Student in an Organized Health Care Education/Training Program

## 2018-08-13 ENCOUNTER — Ambulatory Visit: Payer: Medicare Other | Admitting: Neurology

## 2018-08-13 ENCOUNTER — Encounter: Payer: Self-pay | Admitting: Neurology

## 2018-08-13 VITALS — BP 141/81 | HR 101 | Ht 69.0 in | Wt 195.5 lb

## 2018-08-13 DIAGNOSIS — Z5181 Encounter for therapeutic drug level monitoring: Secondary | ICD-10-CM

## 2018-08-13 DIAGNOSIS — S069X9D Unspecified intracranial injury with loss of consciousness of unspecified duration, subsequent encounter: Secondary | ICD-10-CM

## 2018-08-13 DIAGNOSIS — G40909 Epilepsy, unspecified, not intractable, without status epilepticus: Secondary | ICD-10-CM | POA: Diagnosis not present

## 2018-08-13 DIAGNOSIS — R413 Other amnesia: Secondary | ICD-10-CM

## 2018-08-13 MED ORDER — MEMANTINE HCL 5 MG PO TABS: ORAL_TABLET | ORAL | 0 refills | Status: DC

## 2018-08-13 NOTE — Patient Instructions (Signed)
We will start namenda for the memory. Call after one month if you are doing well on the medication to get a new prescription.
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
## 2018-08-14 ENCOUNTER — Telehealth: Payer: Self-pay

## 2018-08-14 LAB — COMPREHENSIVE METABOLIC PANEL
A/G RATIO: 2 (ref 1.2–2.2)
ALT: 15 IU/L (ref 0–44)
AST: 17 IU/L (ref 0–40)
Albumin: 4.5 g/dL (ref 3.8–4.8)
Alkaline Phosphatase: 86 IU/L (ref 39–117)
BUN/Creatinine Ratio: 8 — ABNORMAL LOW (ref 10–24)
BUN: 8 mg/dL (ref 8–27)
Bilirubin Total: <0.2 mg/dL (ref 0.0–1.2)
CALCIUM: 8.9 mg/dL (ref 8.6–10.2)
CO2: 19 mmol/L — ABNORMAL LOW (ref 20–29)
Chloride: 109 mmol/L — ABNORMAL HIGH (ref 96–106)
Creatinine, Ser: 0.97 mg/dL (ref 0.76–1.27)
GFR, EST AFRICAN AMERICAN: 94 mL/min/{1.73_m2} (ref >59)
GFR, EST NON AFRICAN AMERICAN: 82 mL/min/{1.73_m2} (ref >59)
GLOBULIN, TOTAL: 2.3 g/dL (ref 1.5–4.5)
Glucose: 77 mg/dL (ref 65–99)
POTASSIUM: 4.1 mmol/L (ref 3.5–5.2)
SODIUM: 142 mmol/L (ref 134–144)
TOTAL PROTEIN: 6.8 g/dL (ref 6.0–8.5)

## 2018-08-14 LAB — CBC WITH DIFFERENTIAL/PLATELET
BASOS: 0 %
Basophils Absolute: 0 10*3/uL (ref 0.0–0.2)
EOS (ABSOLUTE): 0.1 10*3/uL (ref 0.0–0.4)
Eos: 2 %
HEMATOCRIT: 42.9 % (ref 37.5–51.0)
Hemoglobin: 14.4 g/dL (ref 13.0–17.7)
Immature Grans (Abs): 0 10*3/uL (ref 0.0–0.1)
Immature Granulocytes: 0 %
LYMPHS: 34 %
Lymphocytes Absolute: 1.3 10*3/uL (ref 0.7–3.1)
MCH: 30.9 pg (ref 26.6–33.0)
MCHC: 33.6 g/dL (ref 31.5–35.7)
MCV: 92 fL (ref 79–97)
Monocytes Absolute: 0.5 10*3/uL (ref 0.1–0.9)
Monocytes: 13 %
NEUTROS ABS: 2 10*3/uL (ref 1.4–7.0)
Neutrophils: 51 %
PLATELETS: 165 10*3/uL (ref 150–450)
RBC: 4.66 x10E6/uL (ref 4.14–5.80)
RDW: 13 % (ref 11.6–15.4)
WBC: 3.9 10*3/uL (ref 3.4–10.8)

## 2018-08-14 LAB — CARBAMAZEPINE LEVEL, TOTAL: Carbamazepine (Tegretol), S: 9.2 ug/mL (ref 4.0–12.0)

## 2018-08-14 NOTE — Telephone Encounter (Signed)
-----   Message from Kathrynn Ducking, MD sent at 08/14/2018 12:49 PM EST ----- Blood work reveals a therapeutic carbamazepine level, no change in dosing.  There appears to be a chronic elevation in chloride level, slight lowering of CO2.  These findings sometimes can be associated with vomiting or diarrhea.  CBC appears to be normal.  Please call the patient. ----- Message ----- From: Interface, [facility] Lab Results In Sent: 08/14/2018   7:39 AM EST To: Kathrynn Ducking, MD

## 2018-08-14 NOTE — Telephone Encounter (Signed)
I called pt, spoke to pt's wife [name], per DPR. I advised her of Dr. Jannifer Franklin' recommendations based on pt's lab results. Pt's wife verbalized understanding. Pt had no questions at this time but was encouraged to call back if questions arise.

## 2018-08-19 ENCOUNTER — Other Ambulatory Visit: Payer: Self-pay

## 2018-08-19 ENCOUNTER — Encounter: Payer: Self-pay | Admitting: Student in an Organized Health Care Education/Training Program

## 2018-08-19 ENCOUNTER — Ambulatory Visit (INDEPENDENT_AMBULATORY_CARE_PROVIDER_SITE_OTHER): Payer: Medicare Other | Admitting: Student in an Organized Health Care Education/Training Program

## 2018-08-19 VITALS — BP 136/80 | HR 86 | Temp 97.9°F | Ht 69.0 in | Wt 198.6 lb

## 2018-08-19 DIAGNOSIS — N401 Enlarged prostate with lower urinary tract symptoms: Secondary | ICD-10-CM | POA: Diagnosis not present

## 2018-08-19 DIAGNOSIS — R350 Frequency of micturition: Secondary | ICD-10-CM

## 2018-08-19 LAB — POCT URINALYSIS DIP (MANUAL ENTRY)
BILIRUBIN UA: NEGATIVE
BILIRUBIN UA: NEGATIVE mg/dL
Glucose, UA: NEGATIVE mg/dL
Leukocytes, UA: NEGATIVE
NITRITE UA: NEGATIVE
PH UA: 7.5 (ref 5.0–8.0)
Protein Ur, POC: NEGATIVE mg/dL
RBC UA: NEGATIVE
SPEC GRAV UA: 1.02 (ref 1.010–1.025)
Urobilinogen, UA: 1 E.U./dL

## 2018-08-19 MED ORDER — TAMSULOSIN HCL 0.4 MG PO CAPS: 0.4000 mg | ORAL_CAPSULE | Freq: Every day | ORAL | 3 refills | Status: DC

## 2018-08-19 NOTE — Progress Notes (Signed)
   CC: urinary frequency  HPI: Stephen Buckley is a 68 y.o. male with PMH significant for TBI and poor memory, HTN, CVA, Hep C, Szr disorder, Tobacco use, allergies, episodic dyscontrol syndrome, dyslipidemia, who presents to [facility] today with his wife. CC is issues with urination.   Patient reports that for the last year, he has had difficulty with urinary incontinence when he goes from sitting to standing.  He has felt that he has had urinary frequency worsening.  No dysuria.  No difficulty with flow.  Times has difficulty starting urination.  No hematuria.  No abdominal pain or constipation.  No nausea or vomiting.  No unintentional weight loss or night sweats.  Review of Symptoms:  See HPI for ROS.   CC, SH/smoking status, and VS noted.  Objective: BP 136/80   Pulse 86   Temp 97.9 F (36.6 C) (Oral)   Ht 5\' 9"  (1.753 m)   Wt 90.1 kg   SpO2 95%   BMI 29.33 kg/m  GEN: NAD, alert, cooperative, and pleasant. RESPIRATORY: Comfortable work of breathing, speaks in full sentences CV: Regular rate noted, distal extremities well perfused and warm without edema GI: Soft, nondistended SKIN: warm and dry, no rashes or lesions NEURO: II-XII grossly intact MSK: Moves 4 extremities equally PSYCH: AAOx3, appropriate affect    Bedside ultrasound notable for ~56ml volume PVR. UA WNL  Assessment and plan:  BPH (benign prostatic hyperplasia) Patient will not tolerate a prostate exam due to sensitive nature of the exam and history of TBI.  However based on his symptoms BPH seems to move the most likely etiology.  Postvoid residual was negligible.  UA was within normal limits.  PSA was 0.5. - start flomax - call if he has symptoms of orthostasis - follow up in 4-8 weeks, can consider titration of medicine     Orders Placed This Encounter  Procedures  . PSA  . POCT urinalysis dipstick    Meds ordered this encounter  Medications  . tamsulosin (FLOMAX) 0.4 MG CAPS capsule    Sig:  Take 1 capsule (0.4 mg total) by mouth daily.    Dispense:  30 capsule    Refill:  3     Everrett Coombe, MD,MS,  PGY3 08/21/2018 10:46 AM

## 2018-08-19 NOTE — Patient Instructions (Signed)
It was a pleasure seeing you today in our clinic. Here is the treatment plan we have discussed and agreed upon together:  We drew blood work at today's visit. I will call or send you a letter with these results. If you do not hear from me within the next week, please give our office a call.  Our clinic's number is [phone]. Please call with questions or concerns about what we discussed today.  Be well, Dr. Rydan Gulyas   

## 2018-08-20 LAB — PSA: Prostate Specific Ag, Serum: 0.5 ng/mL (ref 0.0–4.0)

## 2018-08-21 DIAGNOSIS — N400 Enlarged prostate without lower urinary tract symptoms: Secondary | ICD-10-CM | POA: Insufficient documentation

## 2018-08-21 NOTE — Assessment & Plan Note (Addendum)
Patient will not tolerate a prostate exam due to sensitive nature of the exam and history of TBI.  However based on his symptoms BPH seems to move the most likely etiology.  Postvoid residual was minimal on clinic ultrasound.  UA was within normal limits.  PSA was 0.5. - start flomax - call if he has symptoms of orthostasis - follow up in 4-8 weeks, can consider titration of medicine

## 2018-09-03 ENCOUNTER — Ambulatory Visit (HOSPITAL_COMMUNITY): Payer: Medicare Other | Admitting: Psychiatry

## 2018-09-08 ENCOUNTER — Ambulatory Visit (INDEPENDENT_AMBULATORY_CARE_PROVIDER_SITE_OTHER): Payer: Medicare Other | Admitting: Psychiatry

## 2018-09-08 ENCOUNTER — Other Ambulatory Visit: Payer: Self-pay

## 2018-09-08 VITALS — BP 149/92 | HR 94 | Temp 98.2°F | Ht 69.0 in | Wt 199.0 lb

## 2018-09-08 DIAGNOSIS — F6381 Intermittent explosive disorder: Secondary | ICD-10-CM

## 2018-09-08 DIAGNOSIS — F09 Unspecified mental disorder due to known physiological condition: Secondary | ICD-10-CM

## 2018-09-08 DIAGNOSIS — F319 Bipolar disorder, unspecified: Secondary | ICD-10-CM

## 2018-09-08 DIAGNOSIS — Z811 Family history of alcohol abuse and dependence: Secondary | ICD-10-CM

## 2018-09-08 DIAGNOSIS — F17210 Nicotine dependence, cigarettes, uncomplicated: Secondary | ICD-10-CM | POA: Diagnosis not present

## 2018-09-08 MED ORDER — QUETIAPINE FUMARATE 200 MG PO TABS: 200.0000 mg | ORAL_TABLET | Freq: Every day | ORAL | 0 refills | Status: DC

## 2018-09-08 MED ORDER — CARBAMAZEPINE 200 MG PO TABS: ORAL_TABLET | ORAL | 2 refills | Status: DC
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
## 2018-09-19 ENCOUNTER — Other Ambulatory Visit: Payer: Self-pay

## 2018-09-19 MED ORDER — MEMANTINE HCL 5 MG PO TABS: ORAL_TABLET | ORAL | 3 refills | Status: DC

## 2018-09-22 ENCOUNTER — Other Ambulatory Visit: Payer: Self-pay | Admitting: *Deleted

## 2018-09-22 MED ORDER — MEMANTINE HCL 10 MG PO TABS: 10.0000 mg | ORAL_TABLET | Freq: Two times a day (BID) | ORAL | 5 refills | Status: DC

## 2018-09-22 NOTE — Progress Notes (Signed)
me

## 2018-10-06 ENCOUNTER — Other Ambulatory Visit: Payer: Self-pay | Admitting: Student in an Organized Health Care Education/Training Program

## 2018-10-06 ENCOUNTER — Other Ambulatory Visit: Payer: Self-pay | Admitting: Neurology

## 2018-10-06 DIAGNOSIS — E785 Hyperlipidemia, unspecified: Secondary | ICD-10-CM

## 2018-10-07 NOTE — Telephone Encounter (Signed)
[facility] Database Verified LR: 09/08/2018  Qty: 60 Pending appointment: 02/11/2019 Butler Denmark, NP)

## 2018-10-09 ENCOUNTER — Other Ambulatory Visit: Payer: Self-pay | Admitting: Student in an Organized Health Care Education/Training Program

## 2018-10-09 DIAGNOSIS — J302 Other seasonal allergic rhinitis: Secondary | ICD-10-CM

## 2018-11-08 ENCOUNTER — Other Ambulatory Visit: Payer: Self-pay | Admitting: Diagnostic Neuroimaging

## 2018-11-11 ENCOUNTER — Encounter: Payer: Self-pay | Admitting: Student in an Organized Health Care Education/Training Program

## 2018-11-11 ENCOUNTER — Other Ambulatory Visit: Payer: Self-pay

## 2018-11-11 ENCOUNTER — Ambulatory Visit (INDEPENDENT_AMBULATORY_CARE_PROVIDER_SITE_OTHER): Payer: Medicare Other | Admitting: Student in an Organized Health Care Education/Training Program

## 2018-11-11 VITALS — Wt 194.0 lb

## 2018-11-11 DIAGNOSIS — Z0000 Encounter for general adult medical examination without abnormal findings: Secondary | ICD-10-CM

## 2018-11-11 NOTE — Patient Instructions (Signed)
You spoke to Dr. Burr Medico over the phone for your annual wellness visit.  We discussed goals, including: - You will work on walking more frequently. You will try to exercise twice per week by walking 60 minutes.  Your health maintenance was discussed. Please call our office and schedule a visit. As discussed, you are due for: - Colonoscopy. Forms are attached. Please call to schedule this. - You are due for a pnuemonia vaccine. We can give this in the office the next time you are in for a visit.  We discussed that I will be graduating at the end of June and you will have a new doctor. Please schedule an appointment to meet your new doctor in July.  Our clinic's number is [phone]. Please call with questions or concerns about what we discussed today.
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
## 2018-11-16 ENCOUNTER — Other Ambulatory Visit: Payer: Self-pay | Admitting: Student in an Organized Health Care Education/Training Program

## 2018-11-16 DIAGNOSIS — E785 Hyperlipidemia, unspecified: Secondary | ICD-10-CM

## 2018-11-25 ENCOUNTER — Telehealth: Payer: Self-pay | Admitting: Student in an Organized Health Care Education/Training Program

## 2018-11-25 NOTE — Telephone Encounter (Signed)
Pt is returning a call from [place]. He didn't know who called. I didn't see any documentation in the chart. jw

## 2018-11-26 NOTE — Telephone Encounter (Signed)
Did not see anything where there has been a call from our office.  Will route to PCP to see if by chance she has tried to reach pt. Stephen Buckley, CMA

## 2018-11-26 NOTE — Telephone Encounter (Signed)
I did not try to call the patient. Thanks.

## 2018-12-08 ENCOUNTER — Other Ambulatory Visit: Payer: Self-pay

## 2018-12-08 ENCOUNTER — Encounter (HOSPITAL_COMMUNITY): Payer: Self-pay | Admitting: Psychiatry

## 2018-12-08 ENCOUNTER — Ambulatory Visit (INDEPENDENT_AMBULATORY_CARE_PROVIDER_SITE_OTHER): Payer: Medicare Other | Admitting: Psychiatry

## 2018-12-08 ENCOUNTER — Telehealth (INDEPENDENT_AMBULATORY_CARE_PROVIDER_SITE_OTHER): Payer: Medicare Other | Admitting: Student in an Organized Health Care Education/Training Program

## 2018-12-08 DIAGNOSIS — F09 Unspecified mental disorder due to known physiological condition: Secondary | ICD-10-CM

## 2018-12-08 DIAGNOSIS — F6381 Intermittent explosive disorder: Secondary | ICD-10-CM

## 2018-12-08 DIAGNOSIS — F319 Bipolar disorder, unspecified: Secondary | ICD-10-CM | POA: Diagnosis not present

## 2018-12-08 MED ORDER — QUETIAPINE FUMARATE 200 MG PO TABS: 200.0000 mg | ORAL_TABLET | Freq: Every day | ORAL | 0 refills | Status: DC

## 2018-12-08 MED ORDER — CARBAMAZEPINE 200 MG PO TABS: ORAL_TABLET | ORAL | 2 refills | Status: DC

## 2018-12-08 NOTE — Progress Notes (Signed)
Attempted to call patient 4 times for his visit. No answer and no option to leave voice mail.
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
## 2018-12-15 ENCOUNTER — Other Ambulatory Visit: Payer: Self-pay | Admitting: Student in an Organized Health Care Education/Training Program

## 2018-12-24 ENCOUNTER — Telehealth (INDEPENDENT_AMBULATORY_CARE_PROVIDER_SITE_OTHER): Payer: Medicare Other | Admitting: Student in an Organized Health Care Education/Training Program

## 2018-12-24 ENCOUNTER — Other Ambulatory Visit: Payer: Self-pay

## 2018-12-24 ENCOUNTER — Encounter: Payer: Self-pay | Admitting: Student in an Organized Health Care Education/Training Program

## 2018-12-24 DIAGNOSIS — Z7189 Other specified counseling: Secondary | ICD-10-CM

## 2018-12-24 NOTE — Progress Notes (Signed)
[place] [facility] Telemedicine Visit  Patient consented to have virtual visit. Method of visit: Telephone  Encounter participants: Patient: Stephen Buckley - located at home Provider: Everrett Coombe - located at [facility] Others (if applicable):   Chief Complaint: wants coronavirus testing  HPI: Note: patient has poor short term memory and so history is limited and not always reliable. No family on phone to help corroborate his history today. Patient has been coughing a lot lately and he is concerned about potential coronavirus. He does not know how long he has been coughing. He has not checked his temperature but has not had chills. He has had rhinorrhea and congestion. No sore throat. He has had some fatigue but this is not new for him. No muscle aches. Patient has had two episodes of emesis in the past month. No shortness of breath.  He reports that he continues to take his allergy medicines daily.  No known +COVID19 contacts per his report.   COVID Drive-Up Test Referral Criteria  Patient age: 68 y.o.  Symptoms: Cough  Underlying Conditions: No underlying conditions  Is the patient a first responder? No  Does the patient live or work in a high risk or high density environment: No  Is the patient a COVID convalescent patient who is 14-28 days symptom-free and interested in donating plasma for use as a therapeutic product? No    ROS: per HPI   Pertinent PMHx: tobacco use, CVA, HTN, obesity, HLD, hx of TBI and difficulty controlling behavior, memory problems  Exam:  Respiratory: speaking in full sentences  Assessment/Plan:  Advice Given About Covid-19 Virus by Telephone Symptoms most likely caused by allergies. Reassured by no fevers, no dyspnea. Drive-up testing referral placed. - continue allergy meds with flonase, anti-histamine - social distancing discussed - strict ED precautions for dyspnea    Time spent during visit with patient: 10  minutes

## 2018-12-24 NOTE — Assessment & Plan Note (Signed)
Symptoms most likely caused by allergies. Reassured by no fevers, no dyspnea. Drive-up testing referral placed. - continue allergy meds with flonase, anti-histamine - social distancing discussed - strict ED precautions for dyspnea

## 2018-12-25 ENCOUNTER — Other Ambulatory Visit: Payer: Self-pay

## 2018-12-25 ENCOUNTER — Telehealth: Payer: Self-pay | Admitting: *Deleted

## 2018-12-25 ENCOUNTER — Other Ambulatory Visit: Payer: Self-pay | Admitting: Student in an Organized Health Care Education/Training Program

## 2018-12-25 DIAGNOSIS — J302 Other seasonal allergic rhinitis: Secondary | ICD-10-CM

## 2018-12-25 DIAGNOSIS — Z20822 Contact with and (suspected) exposure to covid-19: Secondary | ICD-10-CM

## 2018-12-25 DIAGNOSIS — R6889 Other general symptoms and signs: Secondary | ICD-10-CM | POA: Diagnosis not present

## 2018-12-25 NOTE — Telephone Encounter (Signed)
-----   Message from Everrett Coombe, MD sent at 12/24/2018  4:24 PM EDT ----- Regarding: COVID19 test referral COVID Drive-Up Test Referral Criteria  Patient age: 35yoM  Symptoms: Cough  Underlying Conditions: No underlying conditions  Is the patient a first responder? No  Does the patient live or work in a high risk or high density environment: No  Is the patient a COVID convalescent patient who is 14-28 days symptom-free and interested in donating plasma for use as a therapeutic product? No

## 2018-12-25 NOTE — Telephone Encounter (Signed)
Scheduled patient for COVID 19 test today at [facility] at 3:45 pm.  Testing protocol reviewed.

## 2018-12-31 LAB — NOVEL CORONAVIRUS, NAA: SARS-CoV-2, NAA: NOT DETECTED

## 2019-01-21 ENCOUNTER — Ambulatory Visit (INDEPENDENT_AMBULATORY_CARE_PROVIDER_SITE_OTHER): Payer: Medicare Other | Admitting: Family Medicine

## 2019-01-21 ENCOUNTER — Encounter: Payer: Self-pay | Admitting: Family Medicine

## 2019-01-21 ENCOUNTER — Other Ambulatory Visit: Payer: Self-pay

## 2019-01-21 VITALS — BP 140/92 | HR 93 | Wt 203.4 lb

## 2019-01-21 DIAGNOSIS — Z13220 Encounter for screening for lipoid disorders: Secondary | ICD-10-CM | POA: Diagnosis not present

## 2019-01-21 DIAGNOSIS — Z1211 Encounter for screening for malignant neoplasm of colon: Secondary | ICD-10-CM | POA: Diagnosis not present

## 2019-01-21 DIAGNOSIS — I10 Essential (primary) hypertension: Secondary | ICD-10-CM

## 2019-01-21 DIAGNOSIS — Z0000 Encounter for general adult medical examination without abnormal findings: Secondary | ICD-10-CM

## 2019-01-21 DIAGNOSIS — R7303 Prediabetes: Secondary | ICD-10-CM

## 2019-01-21 DIAGNOSIS — E785 Hyperlipidemia, unspecified: Secondary | ICD-10-CM

## 2019-01-21 DIAGNOSIS — G44319 Acute post-traumatic headache, not intractable: Secondary | ICD-10-CM

## 2019-01-21 DIAGNOSIS — B182 Chronic viral hepatitis C: Secondary | ICD-10-CM

## 2019-01-21 DIAGNOSIS — R338 Other retention of urine: Secondary | ICD-10-CM

## 2019-01-21 DIAGNOSIS — N401 Enlarged prostate with lower urinary tract symptoms: Secondary | ICD-10-CM

## 2019-01-21 DIAGNOSIS — Z720 Tobacco use: Secondary | ICD-10-CM

## 2019-01-21 LAB — POCT GLYCOSYLATED HEMOGLOBIN (HGB A1C): Hemoglobin A1C: 6.2 % — AB (ref 4.0–5.6)

## 2019-01-21 MED ORDER — TAMSULOSIN HCL 0.4 MG PO CAPS: 0.8000 mg | ORAL_CAPSULE | Freq: Every day | ORAL | 3 refills | Status: DC

## 2019-01-21 NOTE — Patient Instructions (Addendum)
Dear Stephen Buckley,   It was good to see you! Thank you for taking your time to come in to be seen. Today, we discussed the following:   Headaches    Take ibuprofen and tylenol as needed for headaches. Do not take over 2000 mg of Tylenol. A regular strength tylenol is 325mg . For ibuprofen, do not take for more than a few days at a time, as this can cause kidney and stomach issues. Always take ibuprofen with food in your stomach   Prostate Issue   Increasing your prostate medication to 0.8 mg a day to help with your symptoms.   Come back if you are still having urination problems   High Blood Pressure Quitting smoking will help with the blood pressure. Come back if you want to talk about quitting smoking more. Take you blood pressure at home. You pressures are a little high. If your pressures are greater than 140/90, please come back to be seen and adjust medication.  Call the gastroenterologist doctors to check in about your liver once a year.   Please follow up in 1-2 months or sooner for concerning or worsening symptoms.   Be well,   Zettie Cooley, M.D   [facility] [phone]  *Sign up for MyChart for instant access to your health profile, labs, orders, upcoming appointments or to contact your provider with questions*  =================================================================================== Some important items for Doye's health:    Your Blood Pressure.  Should be LESS THAN 140/90 or 150/90 if you are over 65 BP Readings from Last 3 Encounters:  01/21/19 (!) 140/92  09/08/18 (!) 149/92  08/19/18 136/80    Your Weight History Wt Readings from Last 3 Encounters:  01/21/19 203 lb 6.4 oz (92.3 kg)  12/24/18 201 lb (91.2 kg)  11/11/18 194 lb (88 kg)    Body mass index is 30.04 kg/m.  BMI Classes Classification BMI Category (kg/m2)  Underweight < 18.5  Normal Weight 18.5-24.9  Overweight  25.0-29.9  Obese Class I 30.0-34.9  Obese  Class II 35.0-39.9  Obese Class III  > or  = 40.0      Your last A1C     Every 3-6 months if you have diabetes Lab Results  Component Value Date   HGBA1C 6.0 (H) 09/04/2017    Your last Cholesterol   Every 1-5 years    Component Value Date/Time   CHOL 101 09/04/2017 0346   HDL 20 (L) 09/04/2017 0346   LDLCALC 66 09/04/2017 0346   LDLDIRECT 100 (H) 07/31/2013 1433    Your last Blood Tests -  Once a year if you take medications    Component Value Date/Time   K 4.1 08/13/2018 0840   CREATININE 0.97 08/13/2018 0840   CREATININE 0.86 11/17/2014 1015   GLUCOSE 77 08/13/2018 0840   GLUCOSE 103 (H) 09/06/2017 0422    To Keep You Healthy Your are due for the following Health Maintenance Items:  Health Maintenance Due  Topic Date Due  . COLONOSCOPY  03/30/2003  . PNA vac Low Risk Adult (1 of 2 - PCV13) 03/29/2018    Please schedule an appointment with your healthcare provider for any questions or concerns regarding your health or any of the items above.
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
## 2019-01-22 ENCOUNTER — Encounter: Payer: Self-pay | Admitting: Gastroenterology

## 2019-01-22 LAB — COMPREHENSIVE METABOLIC PANEL
ALT: 17 IU/L (ref 0–44)
AST: 20 IU/L (ref 0–40)
Albumin/Globulin Ratio: 2 (ref 1.2–2.2)
Albumin: 4.5 g/dL (ref 3.8–4.8)
Alkaline Phosphatase: 84 IU/L (ref 39–117)
BUN/Creatinine Ratio: 7 — ABNORMAL LOW (ref 10–24)
BUN: 7 mg/dL — ABNORMAL LOW (ref 8–27)
Bilirubin Total: <0.2 mg/dL (ref 0.0–1.2)
CO2: 19 mmol/L — ABNORMAL LOW (ref 20–29)
Calcium: 9.5 mg/dL (ref 8.6–10.2)
Chloride: 106 mmol/L (ref 96–106)
Creatinine, Ser: 0.97 mg/dL (ref 0.76–1.27)
GFR calc Af Amer: 94 mL/min/{1.73_m2} (ref >59)
GFR calc non Af Amer: 82 mL/min/{1.73_m2} (ref >59)
Globulin, Total: 2.3 g/dL (ref 1.5–4.5)
Glucose: 94 mg/dL (ref 65–99)
Potassium: 4.4 mmol/L (ref 3.5–5.2)
Sodium: 143 mmol/L (ref 134–144)
Total Protein: 6.8 g/dL (ref 6.0–8.5)

## 2019-01-22 LAB — LIPID PANEL
Chol/HDL Ratio: 4.4 ratio (ref 0.0–5.0)
Cholesterol, Total: 131 mg/dL (ref 100–199)
HDL: 30 mg/dL — ABNORMAL LOW (ref >39)
LDL Calculated: 74 mg/dL (ref 0–99)
Triglycerides: 134 mg/dL (ref 0–149)
VLDL Cholesterol Cal: 27 mg/dL (ref 5–40)

## 2019-01-27 NOTE — Assessment & Plan Note (Signed)
Lipid panel is within normal limits with exception of low HDL.  We will not make any changes in patient's therapy.

## 2019-01-27 NOTE — Assessment & Plan Note (Addendum)
Per patient's history, sounds to be bladder retention.  Increased Flomax to 0.8 mg daily.  Patient will follow-up in a couple of weeks to determine whether this is helping him.  Can also consider finasteride in the future.

## 2019-01-28 ENCOUNTER — Encounter: Payer: Self-pay | Admitting: Family Medicine

## 2019-01-28 DIAGNOSIS — R519 Headache, unspecified: Secondary | ICD-10-CM | POA: Insufficient documentation

## 2019-01-28 NOTE — Assessment & Plan Note (Signed)
Patient describes an acute headache that is improved with OTC mediation. He has some pain with palpation but does not that it has improved overtime. No acute neuro defects. Will continue to follow. No further work up at this time.

## 2019-01-28 NOTE — Assessment & Plan Note (Signed)
Previously treated. Encouraged patient to follow up annually with GI

## 2019-01-28 NOTE — Assessment & Plan Note (Signed)
Would like to discontinue smoking. Declines any medication or NRT at this time.

## 2019-02-04 ENCOUNTER — Encounter: Payer: Self-pay | Admitting: Gastroenterology

## 2019-02-06 ENCOUNTER — Telehealth: Payer: Self-pay | Admitting: Family Medicine

## 2019-02-06 NOTE — Telephone Encounter (Signed)
Pt would like Dr. Maudie Mercury to give him a call to discuss his appts. Pt had a very hard time trying to explain what he needed. He didn't want to schedule an appt with Maudie Mercury, he just wanted her to call him.

## 2019-02-09 ENCOUNTER — Other Ambulatory Visit: Payer: Self-pay

## 2019-02-09 ENCOUNTER — Ambulatory Visit (AMBULATORY_SURGERY_CENTER): Payer: Self-pay | Admitting: *Deleted

## 2019-02-09 VITALS — Temp 96.6°F | Ht 69.0 in | Wt 201.0 lb

## 2019-02-09 DIAGNOSIS — Z86010 Personal history of colonic polyps: Secondary | ICD-10-CM

## 2019-02-09 MED ORDER — PEG 3350-KCL-NA BICARB-NACL 420 G PO SOLR: 4000.0000 mL | Freq: Once | ORAL | 0 refills | Status: AC

## 2019-02-09 NOTE — Progress Notes (Addendum)
No egg or soy allergy known to patient  No issues with past sedation with any surgeries  or procedures, no intubation problems  No diet pills per patient No home 02 use per patient  No blood thinners per patient  No seizures x 3 years per patient. Pt denies issues with constipation ! Patient stating as long as he eats, no problems No A fib or A flutter  EMMI video sent to pt's e mail  Patient unable to answer if any family hx of gi cancers Patient's wife is in the car at the time of previsit. Called the wife and asked her to come upstairs with the patient to help answer questions and to review the prep. Wife stating no, she would stay in the car and review the literature after the visit. Patient stating her husband wanted to do this on his own. Patient trying very hard to grasp instructions, reviewing multiple times and highlighting times and instructions. Writing down pharmacy address and telephone # for the patient to pick up.very lengthy previsit. Patient reading word for word of the consent. Answered all of the patient's question. Patient's wife calling at [phone],questioning the patient  How much longer.

## 2019-02-10 NOTE — Telephone Encounter (Signed)
Patient called to ask about confirmation of appointments.  He had no medical questions at this time.  He was told that he can call back to [phone] should he have any further questions.  He will not need to contact me directly for any further scheduling questions and he can ask the front desk or anybody who answers the phone what her schedule is.  Wilber Oliphant, M.D.  PGY-2  Family Medicine  [phone] 02/10/2019 3:13 PM

## 2019-02-11 ENCOUNTER — Ambulatory Visit: Payer: Medicare Other | Admitting: Neurology

## 2019-02-18 ENCOUNTER — Encounter: Payer: Self-pay | Admitting: Gastroenterology

## 2019-02-23 ENCOUNTER — Telehealth: Payer: Self-pay

## 2019-02-23 NOTE — Telephone Encounter (Signed)
Covid-19 screening questions   Do you now or have you had a fever in the last 14 days? NO   Do you have any respiratory symptoms of shortness of breath or cough now or in the last 14 days? NO, just the usual cough from smoking.   Do you have any family members or close contacts with diagnosed or suspected Covid-19 in the past 14 days? NO   Have you been tested for Covid-19 and found to be positive? NO

## 2019-02-24 ENCOUNTER — Other Ambulatory Visit: Payer: Self-pay

## 2019-02-24 ENCOUNTER — Encounter: Payer: Self-pay | Admitting: Gastroenterology

## 2019-02-24 ENCOUNTER — Telehealth: Payer: Self-pay

## 2019-02-24 ENCOUNTER — Ambulatory Visit (AMBULATORY_SURGERY_CENTER): Payer: Medicare Other | Admitting: Gastroenterology

## 2019-02-24 VITALS — BP 154/92 | HR 85 | Temp 98.2°F | Resp 22 | Ht 69.0 in | Wt 203.0 lb

## 2019-02-24 DIAGNOSIS — Z86010 Personal history of colonic polyps: Secondary | ICD-10-CM | POA: Diagnosis not present

## 2019-02-24 DIAGNOSIS — D120 Benign neoplasm of cecum: Secondary | ICD-10-CM | POA: Diagnosis not present

## 2019-02-24 DIAGNOSIS — D127 Benign neoplasm of rectosigmoid junction: Secondary | ICD-10-CM

## 2019-02-24 DIAGNOSIS — D122 Benign neoplasm of ascending colon: Secondary | ICD-10-CM | POA: Diagnosis not present

## 2019-02-24 DIAGNOSIS — K219 Gastro-esophageal reflux disease without esophagitis: Secondary | ICD-10-CM | POA: Diagnosis not present

## 2019-02-24 DIAGNOSIS — K635 Polyp of colon: Secondary | ICD-10-CM | POA: Diagnosis not present

## 2019-02-24 MED ORDER — SODIUM CHLORIDE 0.9 % IV SOLN: 500.0000 mL | Freq: Once | INTRAVENOUS | Status: DC

## 2019-02-24 NOTE — Progress Notes (Signed)
Report to PACU, RN, vss, BBS= Clear.  

## 2019-02-24 NOTE — Progress Notes (Signed)
Pt's states no medical or surgical changes since previsit or office visit.  Temp-June bullock  Vital signs-courtney washington 

## 2019-02-24 NOTE — Patient Instructions (Addendum)
YOU HAD AN ENDOSCOPIC PROCEDURE TODAY AT [facility]:   Refer to the procedure report that was given to you for any specific questions about what was found during the examination.  If the procedure report does not answer your questions, please call your gastroenterologist to clarify.  If you requested that your care partner not be given the details of your procedure findings, then the procedure report has been included in a sealed envelope for you to review at your convenience later.  YOU SHOULD EXPECT: Some feelings of bloating in the abdomen. Passage of more gas than usual.  Walking can help get rid of the air that was put into your GI tract during the procedure and reduce the bloating. If you had a lower endoscopy (such as a colonoscopy or flexible sigmoidoscopy) you may notice spotting of blood in your stool or on the toilet paper. If you underwent a bowel prep for your procedure, you may not have a normal bowel movement for a few days.  Please Note:  You might notice some irritation and congestion in your nose or some drainage.  This is from the oxygen used during your procedure.  There is no need for concern and it should clear up in a day or so.  SYMPTOMS TO REPORT IMMEDIATELY:   Following lower endoscopy (colonoscopy or flexible sigmoidoscopy):  Excessive amounts of blood in the stool  Significant tenderness or worsening of abdominal pains  Swelling of the abdomen that is new, acute  Fever of 100F or higher  Please see handouts given to you on Polyps, Diverticulosis, Hemorrhoids and High Fiber Diet. Use Fibercon 1 tablet daily. Please set up appointment to discuss repeat colonscopy at your earliest convenience. [phone]    For urgent or emergent issues, a gastroenterologist can be reached at any hour by calling [phone].   DIET:  We do recommend a small meal at first, but then you may proceed to your regular diet.  Drink plenty of fluids but you should  avoid alcoholic beverages for 24 hours.  ACTIVITY:  You should plan to take it easy for the rest of today and you should NOT DRIVE or use heavy machinery until tomorrow (because of the sedation medicines used during the test).    FOLLOW UP: Our staff will call the number listed on your records 48-72 hours following your procedure to check on you and address any questions or concerns that you may have regarding the information given to you following your procedure. If we do not reach you, we will leave a message.  We will attempt to reach you two times.  During this call, we will ask if you have developed any symptoms of COVID 19. If you develop any symptoms (ie: fever, flu-like symptoms, shortness of breath, cough etc.) before then, please call [phone].  If you test positive for Covid 19 in the 2 weeks post procedure, please call and report this information to [place].    If any biopsies were taken you will be contacted by phone or by letter within the next 1-3 weeks.  Please call us at [phone] if you have not heard about the biopsies in 3 weeks.    SIGNATURES/CONFIDENTIALITY: You and/or your care partner have signed paperwork which will be entered into your electronic medical record.  These signatures attest to the fact that that the information above on your After Visit Summary has been reviewed and is understood.  Full responsibility of the confidentiality of this discharge  information lies with you and/or your care-partner.  Thank you for letting us take care of your healthcare needs today.

## 2019-02-24 NOTE — Telephone Encounter (Signed)
10/6 appt made with Dr Rush Landmark to discuss colon emr.  Pt aware and verbalized understanding.

## 2019-02-24 NOTE — Progress Notes (Signed)
Called to room to assist during endoscopic procedure.  Patient ID and intended procedure confirmed with present staff. Received instructions for my participation in the procedure from the performing physician.  

## 2019-02-24 NOTE — Op Note (Signed)
[place] Patient Name: Stephen Buckley Procedure Date: 02/24/2019 9:02 AM MRN: [id number] Endoscopist: Justice Britain , MD Age: 68 Referring MD:  Date of Birth: Jul 14, 1950 Gender: Male Account #: [id number] Procedure:                Colonoscopy Indications:              Surveillance: Personal history of adenomatous                            polyps on last colonoscopy > 5 years ago Medicines:                Monitored Anesthesia Care Procedure:                Pre-Anesthesia Assessment:                           - Prior to the procedure, a History and Physical                            was performed, and patient medications and                            allergies were reviewed. The patient's tolerance of                            previous anesthesia was also reviewed. The risks                            and benefits of the procedure and the sedation                            options and risks were discussed with the patient.                            All questions were answered, and informed consent                            was obtained. Prior Anticoagulants: The patient has                            taken no previous anticoagulant or antiplatelet                            agents except for aspirin. ASA Grade Assessment:                            III - A patient with severe systemic disease. After                            reviewing the risks and benefits, the patient was                            deemed in satisfactory condition to undergo the  procedure.                           After obtaining informed consent, the colonoscope                            was passed under direct vision. Throughout the                            procedure, the patient's blood pressure, pulse, and                            oxygen saturations were monitored continuously. The                            Colonoscope was introduced through the anus  and                            advanced to the the cecum, identified by                            appendiceal orifice and ileocecal valve. The                            colonoscopy was somewhat difficult due to a                            redundant colon and significant looping. Successful                            completion of the procedure was aided by changing                            the patient to a supine position, using manual                            pressure, withdrawing and reinserting the scope,                            straightening and shortening the scope to obtain                            bowel loop reduction and using scope torsion. The                            patient tolerated the procedure. The quality of the                            bowel preparation was good. The ileocecal valve,                            appendiceal orifice, and rectum were photographed. Scope In: 9:13:49 AM Scope Out: 10:00:55 AM Scope Withdrawal Time: 0 hours 36 minutes 29 seconds  Total Procedure Duration: 0 hours  47 minutes 6 seconds  Findings:                 The digital rectal exam findings include                            hemorrhoids. Pertinent negatives include no                            palpable rectal lesions.                           A 30 mm polyp was found in the proximal ascending                            colon. The polyp was sessile. Polypectomy was not                            attempted as it will need a larger EMR attempt.                           A 20 mm polyp was found in the cecum. The polyp was                            sessile. Preparations were made for mucosal                            resection. Saline was injected to raise the lesion.                            Piecemeal mucosal resection using a snare was                            performed. Resection and retrieval were complete.                            To close the defect after mucosal  resection, two                            hemostatic clips were successfully placed (MR                            conditional). There was no bleeding at the end of                            the procedure.                           Five sessile polyps were found in the recto-sigmoid                            colon (1), ascending colon (3) and cecum (1). The  polyps were 3 to 10 mm in size. These polyps were                            removed with a cold snare. Resection and retrieval                            were complete.                           Many sessile polyps were found in the rectum. The                            polyps were 1 to 6 mm in size. These polyps were                            removed with a cold snare. Resection and retrieval                            were complete.                           Multiple small and large-mouthed diverticula were                            found in the entire colon.                           Normal mucosa was found in the entire colon                            otherwise.                           Non-bleeding non-thrombosed internal hemorrhoids                            were found during retroflexion, during perianal                            exam and during digital exam. The hemorrhoids were                            Grade II (internal hemorrhoids that prolapse but                            reduce spontaneously). Complications:            No immediate complications. Estimated Blood Loss:     Estimated blood loss was minimal. Estimated blood                            loss was minimal. Impression:               - Hemorrhoids found on digital rectal exam.                           -  One 30 mm polyp in the proximal ascending colon.                            Resection not attempted as will need EMR.                           - One 20 mm polyp in the cecum, removed with                            mucosal  resection. Resected and retrieved. Clips                            (MR conditional) were placed.                           - Five 3 to 10 mm polyps at the recto-sigmoid                            colon, in the ascending colon and in the cecum,                            removed with a cold snare. Resected and retrieved.                           - Many 1 to 6 mm polyps in the rectum, removed with                            a cold snare. Resected and retrieved.                           - Diverticulosis in the entire examined colon.                           - Normal mucosa in the entire examined colon.                           - Non-bleeding non-thrombosed internal hemorrhoids. Recommendation:           - The patient will be observed post-procedure,                            until all discharge criteria are met.                           - Discharge patient to home.                           - Patient has a contact number available for                            emergencies. The signs and symptoms of potential                            delayed complications were  discussed with the                            patient. Return to normal activities tomorrow.                            Written discharge instructions were provided to the                            patient.                           - High fiber diet.                           - Use FiberCon 1 tablet PO daily.                           - Continue present medications.                           - Await pathology results.                           - Repeat colonoscopy at appointment to be scheduled                            after a clinic visit to discuss advanced EMR                            attempt at polyp resection.                           - The findings and recommendations were discussed                            with the patient. Justice Britain, MD 02/24/2019 10:12:51 AM

## 2019-02-24 NOTE — Telephone Encounter (Signed)
-----   Message from Irving Copas., MD sent at 02/24/2019  1:23 PM EDT ----- Chong Sicilian, This patient needs a follow up in clinic to discuss Colonoscopy with EMR. Large AC polyp that will require EMR. Would look for a procedure date in the next 8-weeks, unless they want to wait until the clinic visit to schedule. Thank you. GM

## 2019-02-26 ENCOUNTER — Telehealth: Payer: Self-pay

## 2019-02-26 NOTE — Telephone Encounter (Signed)
  Follow up Call-  Call back number 02/24/2019  Post procedure Call Back phone  # [phone]  Permission to leave phone message Yes  Some recent data might be hidden     Patient questions:  Do you have a fever, pain , or abdominal swelling? No. Pain Score  0 *  Have you tolerated food without any problems? Yes.    Have you been able to return to your normal activities? Yes.    Do you have any questions about your discharge instructions: Diet   No. Medications  No. Follow up visit  No.  Do you have questions or concerns about your Care? No.  Actions: * If pain score is 4 or above: No action needed, pain <4. 1. Have you developed a fever since your procedure? no  2.   Have you had an respiratory symptoms (SOB or cough) since your procedure? no  3.   Have you tested positive for COVID 19 since your procedure no  4.   Have you had any family members/close contacts diagnosed with the COVID 19 since your procedure?  no   If yes to any of these questions please route to Joylene John, RN and Alphonsa Gin, Therapist, sports.

## 2019-02-27 ENCOUNTER — Encounter: Payer: Self-pay | Admitting: Gastroenterology

## 2019-03-05 ENCOUNTER — Encounter: Payer: Self-pay | Admitting: Psychology

## 2019-03-10 ENCOUNTER — Encounter (HOSPITAL_COMMUNITY): Payer: Self-pay | Admitting: Psychiatry

## 2019-03-10 ENCOUNTER — Ambulatory Visit (INDEPENDENT_AMBULATORY_CARE_PROVIDER_SITE_OTHER): Payer: Medicare Other | Admitting: Psychiatry

## 2019-03-10 ENCOUNTER — Other Ambulatory Visit: Payer: Self-pay

## 2019-03-10 DIAGNOSIS — F319 Bipolar disorder, unspecified: Secondary | ICD-10-CM

## 2019-03-10 DIAGNOSIS — F09 Unspecified mental disorder due to known physiological condition: Secondary | ICD-10-CM | POA: Diagnosis not present

## 2019-03-10 MED ORDER — QUETIAPINE FUMARATE 200 MG PO TABS: 200.0000 mg | ORAL_TABLET | Freq: Every day | ORAL | 2 refills | Status: DC

## 2019-03-10 MED ORDER — CARBAMAZEPINE 200 MG PO TABS: ORAL_TABLET | ORAL | 2 refills | Status: DC
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
## 2019-03-12 ENCOUNTER — Ambulatory Visit (INDEPENDENT_AMBULATORY_CARE_PROVIDER_SITE_OTHER): Payer: Medicare Other | Admitting: Neurology

## 2019-03-12 ENCOUNTER — Other Ambulatory Visit: Payer: Self-pay

## 2019-03-12 ENCOUNTER — Other Ambulatory Visit: Payer: Self-pay | Admitting: Neurology

## 2019-03-12 ENCOUNTER — Encounter: Payer: Self-pay | Admitting: Neurology

## 2019-03-12 VITALS — BP 124/77 | HR 88 | Temp 97.5°F | Ht 69.0 in | Wt 201.0 lb

## 2019-03-12 DIAGNOSIS — R413 Other amnesia: Secondary | ICD-10-CM

## 2019-03-12 DIAGNOSIS — G40909 Epilepsy, unspecified, not intractable, without status epilepticus: Secondary | ICD-10-CM | POA: Diagnosis not present

## 2019-03-12 MED ORDER — MEMANTINE HCL 10 MG PO TABS: 10.0000 mg | ORAL_TABLET | Freq: Two times a day (BID) | ORAL | 5 refills | Status: DC

## 2019-03-12 NOTE — Patient Instructions (Signed)
1. Continue Namenda 2. Continue carbamazepine 3. Check lab work today  4. Follow-up in 6 months

## 2019-03-12 NOTE — Progress Notes (Signed)
I have read the note, and I agree with the clinical assessment and plan.  Carthel K Willis   

## 2019-03-13 LAB — CBC WITH DIFFERENTIAL/PLATELET
Basophils Absolute: 0 10*3/uL (ref 0.0–0.2)
Basos: 0 %
EOS (ABSOLUTE): 0.1 10*3/uL (ref 0.0–0.4)
Eos: 2 %
Hematocrit: 45.3 % (ref 37.5–51.0)
Hemoglobin: 14.7 g/dL (ref 13.0–17.7)
Immature Grans (Abs): 0 10*3/uL (ref 0.0–0.1)
Immature Granulocytes: 0 %
Lymphocytes Absolute: 1.4 10*3/uL (ref 0.7–3.1)
Lymphs: 29 %
MCH: 29.5 pg (ref 26.6–33.0)
MCHC: 32.5 g/dL (ref 31.5–35.7)
MCV: 91 fL (ref 79–97)
Monocytes Absolute: 0.5 10*3/uL (ref 0.1–0.9)
Monocytes: 11 %
Neutrophils Absolute: 2.7 10*3/uL (ref 1.4–7.0)
Neutrophils: 58 %
Platelets: 189 10*3/uL (ref 150–450)
RBC: 4.99 x10E6/uL (ref 4.14–5.80)
RDW: 12.9 % (ref 11.6–15.4)
WBC: 4.6 10*3/uL (ref 3.4–10.8)

## 2019-03-13 LAB — COMPREHENSIVE METABOLIC PANEL
ALT: 18 IU/L (ref 0–44)
AST: 16 IU/L (ref 0–40)
Albumin/Globulin Ratio: 2.3 — ABNORMAL HIGH (ref 1.2–2.2)
Albumin: 4.8 g/dL (ref 3.8–4.8)
Alkaline Phosphatase: 84 IU/L (ref 39–117)
BUN/Creatinine Ratio: 18 (ref 10–24)
BUN: 15 mg/dL (ref 8–27)
Bilirubin Total: <0.2 mg/dL (ref 0.0–1.2)
CO2: 20 mmol/L (ref 20–29)
Calcium: 9.2 mg/dL (ref 8.6–10.2)
Chloride: 107 mmol/L — ABNORMAL HIGH (ref 96–106)
Creatinine, Ser: 0.82 mg/dL (ref 0.76–1.27)
GFR calc Af Amer: 107 mL/min/{1.73_m2} (ref >59)
GFR calc non Af Amer: 93 mL/min/{1.73_m2} (ref >59)
Globulin, Total: 2.1 g/dL (ref 1.5–4.5)
Glucose: 99 mg/dL (ref 65–99)
Potassium: 4.1 mmol/L (ref 3.5–5.2)
Sodium: 141 mmol/L (ref 134–144)
Total Protein: 6.9 g/dL (ref 6.0–8.5)

## 2019-03-13 LAB — CARBAMAZEPINE LEVEL, TOTAL: Carbamazepine (Tegretol), S: 9.2 ug/mL (ref 4.0–12.0)

## 2019-03-17 ENCOUNTER — Telehealth: Payer: Self-pay | Admitting: *Deleted

## 2019-03-17 NOTE — Telephone Encounter (Signed)
Spoke with patient and informed him his labs are stable. There is a good level of carbamazepine, continue taking it as prescribed. He verbalized understanding, appreciation.

## 2019-03-23 ENCOUNTER — Other Ambulatory Visit: Payer: Self-pay | Admitting: Student in an Organized Health Care Education/Training Program

## 2019-03-31 ENCOUNTER — Other Ambulatory Visit (INDEPENDENT_AMBULATORY_CARE_PROVIDER_SITE_OTHER): Payer: Medicare Other

## 2019-03-31 ENCOUNTER — Ambulatory Visit (INDEPENDENT_AMBULATORY_CARE_PROVIDER_SITE_OTHER): Payer: Medicare Other | Admitting: Gastroenterology

## 2019-03-31 ENCOUNTER — Encounter: Payer: Self-pay | Admitting: Gastroenterology

## 2019-03-31 VITALS — BP 140/76 | HR 92 | Temp 97.9°F | Ht 69.0 in | Wt 207.2 lb

## 2019-03-31 DIAGNOSIS — D122 Benign neoplasm of ascending colon: Secondary | ICD-10-CM | POA: Diagnosis not present

## 2019-03-31 DIAGNOSIS — K219 Gastro-esophageal reflux disease without esophagitis: Secondary | ICD-10-CM

## 2019-03-31 DIAGNOSIS — Z86010 Personal history of colonic polyps: Secondary | ICD-10-CM

## 2019-03-31 DIAGNOSIS — R933 Abnormal findings on diagnostic imaging of other parts of digestive tract: Secondary | ICD-10-CM | POA: Diagnosis not present

## 2019-03-31 DIAGNOSIS — R198 Other specified symptoms and signs involving the digestive system and abdomen: Secondary | ICD-10-CM

## 2019-03-31 DIAGNOSIS — Z8619 Personal history of other infectious and parasitic diseases: Secondary | ICD-10-CM

## 2019-03-31 LAB — CBC
HCT: 42.3 % (ref 39.0–52.0)
Hemoglobin: 14.1 g/dL (ref 13.0–17.0)
MCHC: 33.3 g/dL (ref 30.0–36.0)
MCV: 92.4 fl (ref 78.0–100.0)
Platelets: 151 10*3/uL (ref 150.0–400.0)
RBC: 4.58 Mil/uL (ref 4.22–5.81)
RDW: 13.4 % (ref 11.5–15.5)
WBC: 3.9 10*3/uL — ABNORMAL LOW (ref 4.0–10.5)

## 2019-03-31 LAB — BASIC METABOLIC PANEL
BUN: 8 mg/dL (ref 6–23)
CO2: 24 mEq/L (ref 19–32)
Calcium: 9.3 mg/dL (ref 8.4–10.5)
Chloride: 110 mEq/L (ref 96–112)
Creatinine, Ser: 0.88 mg/dL (ref 0.40–1.50)
GFR: 104.84 mL/min (ref >60.00)
Glucose, Bld: 92 mg/dL (ref 70–99)
Potassium: 4.4 mEq/L (ref 3.5–5.1)
Sodium: 141 mEq/L (ref 135–145)

## 2019-03-31 LAB — PROTIME-INR
INR: 1.1 ratio — ABNORMAL HIGH (ref 0.8–1.0)
Prothrombin Time: 13.1 s (ref 9.6–13.1)

## 2019-03-31 MED ORDER — PEG 3350-KCL-NA BICARB-NACL 420 G PO SOLR: 4000.0000 mL | Freq: Once | ORAL | 0 refills | Status: AC

## 2019-03-31 NOTE — Patient Instructions (Signed)
You have been scheduled for a colonoscopy. Please follow written instructions given to you at your visit today.  Please pick up your prep supplies at the pharmacy within the next 1-3 days. If you use inhalers (even only as needed), please bring them with you on the day of your procedure.  Your provider has requested that you go to the basement level for lab work before leaving today. Press "B" on the elevator. The lab is located at the first door on the left as you exit the elevator.  A high fiber diet with plenty of fluids (up to 8 glasses of water daily) is suggested to relieve these symptoms.  Metamucil, 1 tablespoon once or twice daily can be used to keep bowels regular if needed.   Thank you for choosing me and [facility].  Dr. Rush Landmark

## 2019-04-01 ENCOUNTER — Encounter: Payer: Self-pay | Admitting: Gastroenterology
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
## 2019-04-02 DIAGNOSIS — D122 Benign neoplasm of ascending colon: Secondary | ICD-10-CM | POA: Insufficient documentation

## 2019-04-02 DIAGNOSIS — R933 Abnormal findings on diagnostic imaging of other parts of digestive tract: Secondary | ICD-10-CM | POA: Insufficient documentation

## 2019-04-02 DIAGNOSIS — Z860100 Personal history of colon polyps, unspecified: Secondary | ICD-10-CM | POA: Insufficient documentation

## 2019-04-02 DIAGNOSIS — K219 Gastro-esophageal reflux disease without esophagitis: Secondary | ICD-10-CM | POA: Insufficient documentation

## 2019-04-02 DIAGNOSIS — R198 Other specified symptoms and signs involving the digestive system and abdomen: Secondary | ICD-10-CM | POA: Insufficient documentation

## 2019-04-02 DIAGNOSIS — Z8619 Personal history of other infectious and parasitic diseases: Secondary | ICD-10-CM | POA: Insufficient documentation

## 2019-04-02 HISTORY — DX: Benign neoplasm of ascending colon: D12.2

## 2019-04-03 ENCOUNTER — Other Ambulatory Visit: Payer: Self-pay | Admitting: *Deleted

## 2019-04-03 DIAGNOSIS — E785 Hyperlipidemia, unspecified: Secondary | ICD-10-CM

## 2019-04-03 MED ORDER — ATORVASTATIN CALCIUM 40 MG PO TABS: 40.0000 mg | ORAL_TABLET | Freq: Every day | ORAL | 3 refills | Status: DC

## 2019-04-06 ENCOUNTER — Other Ambulatory Visit: Payer: Self-pay | Admitting: Student in an Organized Health Care Education/Training Program

## 2019-04-06 DIAGNOSIS — J302 Other seasonal allergic rhinitis: Secondary | ICD-10-CM

## 2019-04-07 ENCOUNTER — Other Ambulatory Visit: Payer: Self-pay | Admitting: Student in an Organized Health Care Education/Training Program

## 2019-04-07 DIAGNOSIS — J302 Other seasonal allergic rhinitis: Secondary | ICD-10-CM

## 2019-04-08 ENCOUNTER — Telehealth: Payer: Self-pay | Admitting: Gastroenterology

## 2019-04-08 NOTE — Telephone Encounter (Signed)
The pt states he has had black stools for the past 3 days.  He has not seen any blood.  No other symptoms.  No new foods, pepto or iron.  After speaking with the pt a few minutes he tells me he ate a whole package of oreo cookies and the black stools began after that.  He will not eat any more cookies and see if the stools return to normal.  He has an appt for a colon on 11/9 and will keep that appt as planned and call back if he has further concerns.

## 2019-04-08 NOTE — Telephone Encounter (Signed)
The pt has not received his prep instructions by mail as of today.  It was mailed out on 10/8.  The pt will give it a few more days and call back if not received by Friday.

## 2019-04-08 NOTE — Telephone Encounter (Signed)
Pt states that he has been having black stools. He would like to speak with you about it.

## 2019-04-08 NOTE — Telephone Encounter (Signed)
Pt has questions regarding appt and requested a call back.

## 2019-04-11 ENCOUNTER — Other Ambulatory Visit: Payer: Self-pay | Admitting: Family Medicine

## 2019-04-11 DIAGNOSIS — J302 Other seasonal allergic rhinitis: Secondary | ICD-10-CM

## 2019-04-30 ENCOUNTER — Other Ambulatory Visit (HOSPITAL_COMMUNITY)
Admission: RE | Admit: 2019-04-30 | Discharge: 2019-04-30 | Disposition: A | Discharge status: Home / Self Care | Payer: Medicare Other | Source: Ambulatory Visit | Attending: Gastroenterology | Admitting: Gastroenterology

## 2019-04-30 DIAGNOSIS — Z20828 Contact with and (suspected) exposure to other viral communicable diseases: Secondary | ICD-10-CM | POA: Insufficient documentation

## 2019-04-30 DIAGNOSIS — Z01812 Encounter for preprocedural laboratory examination: Secondary | ICD-10-CM | POA: Diagnosis not present

## 2019-05-01 ENCOUNTER — Encounter (HOSPITAL_COMMUNITY): Payer: Self-pay | Admitting: *Deleted

## 2019-05-02 LAB — NOVEL CORONAVIRUS, NAA (HOSP ORDER, SEND-OUT TO REF LAB; TAT 18-24 HRS): SARS-CoV-2, NAA: NOT DETECTED

## 2019-05-04 ENCOUNTER — Ambulatory Visit (HOSPITAL_COMMUNITY): Payer: Medicare Other | Admitting: Certified Registered Nurse Anesthetist

## 2019-05-04 ENCOUNTER — Ambulatory Visit (HOSPITAL_COMMUNITY)
Admission: RE | Admit: 2019-05-04 | Discharge: 2019-05-04 | Disposition: A | Discharge status: Home / Self Care | Payer: Medicare Other | Attending: Gastroenterology | Admitting: Gastroenterology

## 2019-05-04 ENCOUNTER — Other Ambulatory Visit: Payer: Self-pay

## 2019-05-04 ENCOUNTER — Encounter (HOSPITAL_COMMUNITY)
Admission: RE | Disposition: A | Discharge status: Home / Self Care | Payer: Self-pay | Source: Home / Self Care | Attending: Gastroenterology

## 2019-05-04 ENCOUNTER — Encounter (HOSPITAL_COMMUNITY): Payer: Self-pay | Admitting: Gastroenterology

## 2019-05-04 DIAGNOSIS — D120 Benign neoplasm of cecum: Secondary | ICD-10-CM | POA: Insufficient documentation

## 2019-05-04 DIAGNOSIS — Z86010 Personal history of colonic polyps: Secondary | ICD-10-CM

## 2019-05-04 DIAGNOSIS — I10 Essential (primary) hypertension: Secondary | ICD-10-CM | POA: Diagnosis not present

## 2019-05-04 DIAGNOSIS — K641 Second degree hemorrhoids: Secondary | ICD-10-CM | POA: Insufficient documentation

## 2019-05-04 DIAGNOSIS — F17210 Nicotine dependence, cigarettes, uncomplicated: Secondary | ICD-10-CM | POA: Insufficient documentation

## 2019-05-04 DIAGNOSIS — Q438 Other specified congenital malformations of intestine: Secondary | ICD-10-CM | POA: Diagnosis not present

## 2019-05-04 DIAGNOSIS — K219 Gastro-esophageal reflux disease without esophagitis: Secondary | ICD-10-CM | POA: Diagnosis not present

## 2019-05-04 DIAGNOSIS — K635 Polyp of colon: Secondary | ICD-10-CM | POA: Diagnosis not present

## 2019-05-04 DIAGNOSIS — D122 Benign neoplasm of ascending colon: Secondary | ICD-10-CM | POA: Diagnosis not present

## 2019-05-04 DIAGNOSIS — H409 Unspecified glaucoma: Secondary | ICD-10-CM | POA: Diagnosis not present

## 2019-05-04 DIAGNOSIS — F329 Major depressive disorder, single episode, unspecified: Secondary | ICD-10-CM | POA: Insufficient documentation

## 2019-05-04 DIAGNOSIS — K5730 Diverticulosis of large intestine without perforation or abscess without bleeding: Secondary | ICD-10-CM | POA: Insufficient documentation

## 2019-05-04 DIAGNOSIS — F419 Anxiety disorder, unspecified: Secondary | ICD-10-CM | POA: Insufficient documentation

## 2019-05-04 DIAGNOSIS — K621 Rectal polyp: Secondary | ICD-10-CM | POA: Diagnosis not present

## 2019-05-04 HISTORY — PX: POLYPECTOMY: SHX5525

## 2019-05-04 HISTORY — PX: ENDOSCOPIC MUCOSAL RESECTION: SHX6839

## 2019-05-04 HISTORY — PX: HEMOSTASIS CLIP PLACEMENT: SHX6857

## 2019-05-04 HISTORY — PX: COLONOSCOPY WITH PROPOFOL: SHX5780

## 2019-05-04 HISTORY — PX: SUBMUCOSAL LIFTING INJECTION: SHX6855

## 2019-05-04 SURGERY — COLONOSCOPY WITH PROPOFOL
Anesthesia: Monitor Anesthesia Care

## 2019-05-04 MED ORDER — LACTATED RINGERS IV SOLN
INTRAVENOUS | Status: DC
  Administered 2019-05-04: 09:00:00 1000 mL via INTRAVENOUS
  Administered 2019-05-04: 09:00:00 via INTRAVENOUS

## 2019-05-04 MED ORDER — PROPOFOL 500 MG/50ML IV EMUL
INTRAVENOUS | Status: DC | PRN
  Administered 2019-05-04: 100 ug/kg/min via INTRAVENOUS

## 2019-05-04 MED ORDER — SODIUM CHLORIDE 0.9 % IV SOLN: INTRAVENOUS | Status: DC

## 2019-05-04 MED ORDER — PROPOFOL 10 MG/ML IV BOLUS
INTRAVENOUS | Status: DC | PRN
  Administered 2019-05-04 (×2): 20 mg via INTRAVENOUS

## 2019-05-04 SURGICAL SUPPLY — 22 items

## 2019-05-04 NOTE — Op Note (Signed)
[facility] Patient Name: Stephen Buckley Procedure Date : 05/04/2019 MRN: [id number] Attending MD: Justice Britain , MD Date of Birth: [birth year] CSN: [id number] Age: 68 Admit Type: Inpatient Procedure:                Colonoscopy Indications:              Excision of colonic polyp Providers:                Justice Britain, MD, Carlyn Reichert, RN, Ladona Ridgel, Technician Referring MD:             Wilber Oliphant Medicines:                Monitored Anesthesia Care Complications:            No immediate complications. Estimated Blood Loss:     Estimated blood loss was minimal. Procedure:                Pre-Anesthesia Assessment:                           - Prior to the procedure, a History and Physical                            was performed, and patient medications and                            allergies were reviewed. The patient's tolerance of                            previous anesthesia was also reviewed. The risks                            and benefits of the procedure and the sedation                            options and risks were discussed with the patient.                            All questions were answered, and informed consent                            was obtained. Prior Anticoagulants: The patient has                            taken no previous anticoagulant or antiplatelet                            agents except for aspirin. ASA Grade Assessment:                            III - A patient with severe systemic disease. After  reviewing the risks and benefits, the patient was                            deemed in satisfactory condition to undergo the                            procedure.                           After obtaining informed consent, the colonoscope                            was passed under direct vision. Throughout the                            procedure, the patient's  blood pressure, pulse, and                            oxygen saturations were monitored continuously. The                            CF-HQ190L ([id number]) Olympus colonoscope was                            introduced through the anus and advanced to the the                            cecum, identified by appendiceal orifice and                            ileocecal valve. The colonoscopy was extremely                            difficult due to a redundant colon and significant                            looping. Successful completion of the procedure was                            aided by changing the patient's position, using                            manual pressure, withdrawing and reinserting the                            scope, straightening and shortening the scope to                            obtain bowel loop reduction and using scope                            torsion. The patient tolerated the procedure. Scope In: 10:42:47 AM Scope Out: 11:57:59 AM Scope Withdrawal Time: 0 hours 52 minutes 43 seconds  Total Procedure Duration: 1 hour 15 minutes  12 seconds  Findings:      The digital rectal exam findings include hemorrhoids. Pertinent       negatives include no palpable rectal lesions.      A small post mucosectomy scar was found in the cecum. There was residual       polypoid tissue. The polyp was removed with a cold snare to rule out       persistent adenoma. Resection and retrieval were complete.      A 28 mm polyp was found in the ascending colon. The polyp was sessile.       Preparations were made for mucosal resection. Orise gel was injected to       raise the lesion. Piecemeal mucosal resection using a cold snare was       performed. Resection and retrieval were complete. To close the defect       after mucosal resection, five hemostatic clips were successfully placed       (MR conditional). There was no bleeding at the end of the procedure.      Many small-mouthed  diverticula were found in the recto-sigmoid colon,       sigmoid colon, descending colon and ascending colon.      Many sessile polyps were found in the rectum and recto-sigmoid colon.       The polyps were small in size. Previously sampled and were hyperplastic.      Non-bleeding non-thrombosed internal hemorrhoids were found during       perianal exam, during digital exam and during endoscopy. The hemorrhoids       were Grade II (internal hemorrhoids that prolapse but reduce       spontaneously). Impression:               - Hemorrhoids found on digital rectal exam.                           - Post mucosectomy scar in the cecum. Removed                            polypoid tissue.                           - One 28 mm polyp in the ascending colon, removed                            with mucosal resection. Resected and retrieved.                            Clips (MR conditional) were placed.                           - Diverticulosis in the recto-sigmoid colon, in the                            sigmoid colon, in the descending colon and in the                            ascending colon.                           -  Many small polyps in the rectum and at the                            recto-sigmoid colon.                           - Non-bleeding non-thrombosed internal hemorrhoids. Recommendation:           - The patient will be observed post-procedure,                            until all discharge criteria are met.                           - Discharge patient to home.                           - Patient has a contact number available for                            emergencies. The signs and symptoms of potential                            delayed complications were discussed with the                            patient. Return to normal activities tomorrow.                            Written discharge instructions were provided to the                            patient.                            - High fiber diet.                           - Use FiberCon 1 tablet PO daily.                           - Follow up Pathology.                           - Repeat colonoscopy in 6 months for surveillance.                           - The findings and recommendations were discussed                            with the patient.                           - The findings and recommendations were discussed                            with the  patient's family. Procedure Code(s):        --- Professional ---                           [phone], Colonoscopy, flexible; with endoscopic                            mucosal resection                           [id number], 72, Colonoscopy, flexible; with removal of                            tumor(s), polyp(s), or other lesion(s) by snare                            technique Diagnosis Code(s):        --- Professional ---                           K64.1, Second degree hemorrhoids                           K63.5, Polyp of colon                           Z98.890, Other specified postprocedural states                           K62.1, Rectal polyp                           K57.30, Diverticulosis of large intestine without                            perforation or abscess without bleeding CPT copyright 2019 [employer]. All rights reserved. The codes documented in this report are preliminary and upon coder review may  be revised to meet current compliance requirements. Justice Britain, MD 05/04/2019 12:17:45 PM Number of Addenda: 0

## 2019-05-04 NOTE — Discharge Instructions (Addendum)
Colon Polyps  Polyps are tissue growths inside the body. Polyps can grow in many places, including the large intestine (colon). A polyp may be a round bump or a mushroom-shaped growth. You could have one polyp or several. Most colon polyps are noncancerous (benign). However, some colon polyps can become cancerous over time. Finding and removing the polyps early can help prevent this. What are the causes? The exact cause of colon polyps is not known. What increases the risk? You are more likely to develop this condition if you:  Have a family history of colon cancer or colon polyps.  Are older than 29 or older than 45 if you are African American.  Have inflammatory bowel disease, such as ulcerative colitis or Crohn's disease.  Have certain hereditary conditions, such as: ? Familial adenomatous polyposis. ? Lynch syndrome. ? Turcot syndrome. ? Peutz-Jeghers syndrome.  Are overweight.  Smoke cigarettes.  Do not get enough exercise.  Drink too much alcohol.  Eat a diet that is high in fat and red meat and low in fiber.  Had childhood cancer that was treated with abdominal radiation. What are the signs or symptoms? Most polyps do not cause symptoms. If you have symptoms, they may include:  Blood coming from your rectum when having a bowel movement.  Blood in your stool. The stool may look dark red or black.  Abdominal pain.  A change in bowel habits, such as constipation or diarrhea. How is this diagnosed? This condition is diagnosed with a colonoscopy. This is a procedure in which a lighted, flexible scope is inserted into the anus and then passed into the colon to examine the area. Polyps are sometimes found when a colonoscopy is done as part of routine cancer screening tests. How is this treated? Treatment for this condition involves removing any polyps that are found. Most polyps can be removed during a colonoscopy. Those polyps will then be tested for cancer. Additional  treatment may be needed depending on the results of testing. Follow these instructions at home: Lifestyle  Maintain a healthy weight, or lose weight if recommended by your health care provider.  Exercise every day or as told by your health care provider.  Do not use any products that contain nicotine or tobacco, such as cigarettes and e-cigarettes. If you need help quitting, ask your health care provider.  If you drink alcohol, limit how much you have: ? 0-1 drink a day for women. ? 0-2 drinks a day for men.  Be aware of how much alcohol is in your drink. In the [place] one drink equals one 12 oz bottle of beer (355 mL), one 5 oz glass of wine (148 mL), or one 1 oz shot of hard liquor (44 mL). Eating and drinking   Eat foods that are high in fiber, such as fruits, vegetables, and whole grains.  Eat foods that are high in calcium and vitamin D, such as milk, cheese, yogurt, eggs, liver, fish, and broccoli.  Limit foods that are high in fat, such as fried foods and desserts.  Limit the amount of red meat and processed meat you eat, such as hot dogs, sausage, bacon, and lunch meats. General instructions  Keep all follow-up visits as told by your health care provider. This is important. ? This includes having regularly scheduled colonoscopies. ? Talk to your health care provider about when you need a colonoscopy. Contact a health care provider if:  You have new or worsening bleeding during a bowel movement.  You  have new or increased blood in your stool.  You have a change in bowel habits.  You lose weight for no known reason. Summary  Polyps are tissue growths inside the body. Polyps can grow in many places, including the colon.  Most colon polyps are noncancerous (benign), but some can become cancerous over time.  This condition is diagnosed with a colonoscopy.  Treatment for this condition involves removing any polyps that are found. Most polyps can be removed during a  colonoscopy. This information is not intended to replace advice given to you by your health care provider. Make sure you discuss any questions you have with your health care provider. Document Released: 03/07/2004 Document Revised: 09/26/2017 Document Reviewed: 09/26/2017 Elsevier Patient Education  2020 [employer].

## 2019-05-04 NOTE — Anesthesia Procedure Notes (Signed)
Procedure Name: MAC Date/Time: 05/04/2019 10:35 AM Performed by: Harden Mo, CRNA Pre-anesthesia Checklist: Patient identified, Emergency Drugs available, Suction available and Patient being monitored Patient Re-evaluated:Patient Re-evaluated prior to induction Oxygen Delivery Method: Simple face mask Preoxygenation: Pre-oxygenation with 100% oxygen Induction Type: IV induction Placement Confirmation: positive ETCO2 and breath sounds checked- equal and bilateral Dental Injury: Teeth and Oropharynx as per pre-operative assessment

## 2019-05-04 NOTE — Anesthesia Postprocedure Evaluation (Signed)
Anesthesia Post Note  Patient: Stephen Buckley  Procedure(s) Performed: COLONOSCOPY (N/A ) ENDOSCOPIC MUCOSAL RESECTION (N/A ) SUBMUCOSAL LIFTING INJECTION HEMOSTASIS CLIP PLACEMENT POLYPECTOMY     Patient location during evaluation: Endoscopy Anesthesia Type: MAC Level of consciousness: awake Pain management: pain level controlled Vital Signs Assessment: post-procedure vital signs reviewed and stable Respiratory status: spontaneous breathing Cardiovascular status: stable Postop Assessment: no apparent nausea or vomiting Anesthetic complications: no    Last Vitals:  Vitals:   05/04/19 1230 05/04/19 1246  BP: (!) 170/93 (!) 163/90  Pulse: 73 74  Resp: 16 14  Temp:  (!) 36.1 C  SpO2: 99% 100%    Last Pain:  Vitals:   05/04/19 1246  TempSrc:   PainSc: 0-No pain                 Laurice Iglesia

## 2019-05-04 NOTE — Transfer of Care (Signed)
Immediate Anesthesia Transfer of Care Note  Patient: Stephen Buckley [facility]  Procedure(s) Performed: COLONOSCOPY (N/A ) ENDOSCOPIC MUCOSAL RESECTION (N/A ) SUBMUCOSAL LIFTING INJECTION HEMOSTASIS CLIP PLACEMENT POLYPECTOMY  Patient Location: PACU  Anesthesia Type:MAC  Level of Consciousness: awake and alert   Airway & Oxygen Therapy: Patient Spontanous Breathing  Post-op Assessment: Report given to RN, Post -op Vital signs reviewed and stable and Patient moving all extremities X 4  Post vital signs: Reviewed and stable  Last Vitals:  Vitals Value Taken Time  BP    Temp    Pulse    Resp    SpO2      Last Pain:  Vitals:   05/04/19 0812  TempSrc: Oral  PainSc: 0-No pain         Complications: No apparent anesthesia complications
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
## 2019-05-04 NOTE — Anesthesia Preprocedure Evaluation (Signed)
Anesthesia Evaluation  Patient identified by MRN, date of birth, ID band  Reviewed: Allergy & Precautions, NPO status , Patient's Chart, lab work & pertinent test results  Airway Mallampati: II  TM Distance: >3 FB     Dental   Pulmonary Current Smoker,    breath sounds clear to auscultation       Cardiovascular hypertension,  Rhythm:Regular Rate:Normal     Neuro/Psych  Headaches, Seizures -,  Anxiety Depression TIACVA    GI/Hepatic GERD  ,(+) Hepatitis -  Endo/Other    Renal/GU      Musculoskeletal   Abdominal   Peds  Hematology  (+) anemia ,   Anesthesia Other Findings   Reproductive/Obstetrics                             Anesthesia Physical Anesthesia Plan  ASA: III  Anesthesia Plan: MAC   Post-op Pain Management:    Induction: Intravenous  PONV Risk Score and Plan: Propofol infusion  Airway Management Planned: Nasal Cannula and Simple Face Mask  Additional Equipment:   Intra-op Plan:   Post-operative Plan:   Informed Consent: I have reviewed the patients History and Physical, chart, labs and discussed the procedure including the risks, benefits and alternatives for the proposed anesthesia with the patient or authorized representative who has indicated his/her understanding and acceptance.     Dental advisory given  Plan Discussed with: CRNA and Anesthesiologist  Anesthesia Plan Comments:         Anesthesia Quick Evaluation

## 2019-05-05 ENCOUNTER — Encounter (HOSPITAL_COMMUNITY): Payer: Self-pay | Admitting: Gastroenterology

## 2019-05-05 ENCOUNTER — Encounter: Payer: Self-pay | Admitting: Gastroenterology

## 2019-05-05 LAB — SURGICAL PATHOLOGY

## 2019-05-05 NOTE — Progress Notes (Signed)
Attempted, left message/bt  

## 2019-05-11 ENCOUNTER — Other Ambulatory Visit: Payer: Self-pay | Admitting: Family Medicine

## 2019-05-24 ENCOUNTER — Other Ambulatory Visit: Payer: Self-pay | Admitting: Family Medicine

## 2019-05-25 ENCOUNTER — Telehealth: Payer: Self-pay | Admitting: Family Medicine

## 2019-05-25 ENCOUNTER — Other Ambulatory Visit: Payer: Self-pay

## 2019-05-25 MED ORDER — TAMSULOSIN HCL 0.4 MG PO CAPS: 0.4000 mg | ORAL_CAPSULE | Freq: Every day | ORAL | 2 refills | Status: DC

## 2019-05-25 NOTE — Progress Notes (Signed)
Spoke to patient over the phone concerning flomax. At last appt, we increased his flomax to 0.8 to see if it would help with his urinary symptoms. He reports that he is still having problems. Sent refills for 0.4 mg daily, down from 0.8mg  for patient and made an appt for him to follow up as below.   Future Appointments  Date Time Provider [place]  06/01/2019  2:45 PM Wilber Oliphant, MD [facility]  06/09/2019  3:40 PM Arfeen, Arlyce Harman, MD [facility] None  09/09/2019  8:45 AM Suzzanne Cloud, NP [facility] None   Wilber Oliphant, M.D.  2:04 PM 05/25/2019

## 2019-05-31 NOTE — Progress Notes (Signed)
[place] Telemedicine Visit  Patient consented to have virtual visit. Method of visit: Telephone  Encounter participants: Patient: Stephen Buckley - located at home Provider: Wilber Oliphant - located at [facility] Others (if applicable): His wife is in the background but is not involved in most of conversation except for scheduling follow-up appointment  Chief Complaint: Urinary issues  HPI:  Patient with history of BPH returns for follow-up virtual visit to address urinary incontinence and dribbling.  Patient continues to report the same issues despite increasing Flomax to 0.8 mg and then back down to 0.4 mg.  He reports that his symptoms are the same, he has the urge to urinate as soon as he stands up and experiences incontinence at that time.  He also reports having dribbling and unsure if he is completely emptying his bladder.  The urinary issues happen throughout the entire day and overnight  His current psychiatric medications include Seroquel 200 mg at bedtime, Tegretol 100 mg in the a.m. and 300 mg at p.m.  Ativan 0.5 mg twice daily.  ROS: per HPI  Pertinent PMHx: History of CVA, chronic hep C, TBI, seizure disorder, bipolar disorder type I, cognitive disorder NOS  Exam:  Respiratory: No respiratory distress.  Finishing full sentences.  Assessment/Plan:  Urinary disorder Patient had urinary complaints at office visit a few months ago.  At that time, tried to increase Flomax to 0.8 mg.  Patient did not have any improvement with 0.8 mg and was decreased back to 0.4 mg.  His urinary symptoms are combination of incontinence as well as dribbling and possible urinary retention.  I would like patient to come in to be seen for urine sample to rule out UTI.  Would also like to do postvoid residual in office.  Patient symptoms are nonspecific at this time.  Patient's risk factors include BPH, history of bipolar and cognitive disorder, age.  Cannot come to a definitive  diagnosis at this time but differential includes urge, stress, mixed incontinence.  Incontinence due to depression or psychological disorder.  Medication list does not show any obvious medications with exception of psychiatric medications. -Follow-up next week in person     Time spent during visit with patient: 20 minutes  Wilber Oliphant, M.D.  9:18 AM 06/05/2019

## 2019-06-01 ENCOUNTER — Telehealth (INDEPENDENT_AMBULATORY_CARE_PROVIDER_SITE_OTHER): Payer: Medicare Other | Admitting: Family Medicine

## 2019-06-01 ENCOUNTER — Other Ambulatory Visit: Payer: Self-pay

## 2019-06-01 DIAGNOSIS — N399 Disorder of urinary system, unspecified: Secondary | ICD-10-CM

## 2019-06-01 NOTE — Progress Notes (Signed)
[place], [place] - [address]  BP- N/A  T- N/A

## 2019-06-05 ENCOUNTER — Encounter: Payer: Self-pay | Admitting: Family Medicine

## 2019-06-05 ENCOUNTER — Ambulatory Visit: Payer: Medicare Other | Admitting: Family Medicine

## 2019-06-05 DIAGNOSIS — N399 Disorder of urinary system, unspecified: Secondary | ICD-10-CM | POA: Insufficient documentation

## 2019-06-05 NOTE — Assessment & Plan Note (Signed)
Patient had urinary complaints at office visit a few months ago.  At that time, tried to increase Flomax to 0.8 mg.  Patient did not have any improvement with 0.8 mg and was decreased back to 0.4 mg.  His urinary symptoms are combination of incontinence as well as dribbling and possible urinary retention.  I would like patient to come in to be seen for urine sample to rule out UTI.  Would also like to do postvoid residual in office.  Patient symptoms are nonspecific at this time.  Patient's risk factors include BPH, history of bipolar and cognitive disorder, age.  Cannot come to a definitive diagnosis at this time but differential includes urge, stress, mixed incontinence.  Incontinence due to depression or psychological disorder.  Medication list does not show any obvious medications with exception of psychiatric medications. -Follow-up next week in person

## 2019-06-09 ENCOUNTER — Other Ambulatory Visit: Payer: Self-pay

## 2019-06-09 ENCOUNTER — Ambulatory Visit (INDEPENDENT_AMBULATORY_CARE_PROVIDER_SITE_OTHER): Payer: Medicare Other | Admitting: Family Medicine

## 2019-06-09 ENCOUNTER — Encounter (HOSPITAL_COMMUNITY): Payer: Self-pay | Admitting: Psychiatry

## 2019-06-09 ENCOUNTER — Encounter: Payer: Self-pay | Admitting: Family Medicine

## 2019-06-09 ENCOUNTER — Ambulatory Visit (INDEPENDENT_AMBULATORY_CARE_PROVIDER_SITE_OTHER): Payer: Medicare Other | Admitting: Psychiatry

## 2019-06-09 VITALS — BP 135/80 | HR 84 | Wt 203.8 lb

## 2019-06-09 DIAGNOSIS — F09 Unspecified mental disorder due to known physiological condition: Secondary | ICD-10-CM

## 2019-06-09 DIAGNOSIS — N399 Disorder of urinary system, unspecified: Secondary | ICD-10-CM | POA: Diagnosis not present

## 2019-06-09 DIAGNOSIS — F319 Bipolar disorder, unspecified: Secondary | ICD-10-CM

## 2019-06-09 DIAGNOSIS — R3911 Hesitancy of micturition: Secondary | ICD-10-CM

## 2019-06-09 DIAGNOSIS — N401 Enlarged prostate with lower urinary tract symptoms: Secondary | ICD-10-CM

## 2019-06-09 LAB — POCT URINALYSIS DIP (CLINITEK)
Bilirubin, UA: NEGATIVE
Blood, UA: NEGATIVE
Glucose, UA: NEGATIVE mg/dL
Ketones, POC UA: NEGATIVE mg/dL
Leukocytes, UA: NEGATIVE
Nitrite, UA: NEGATIVE
POC PROTEIN,UA: NEGATIVE
Spec Grav, UA: 1.02 (ref 1.010–1.025)
Urobilinogen, UA: 1 E.U./dL
pH, UA: 7 (ref 5.0–8.0)

## 2019-06-09 LAB — POCT UA - MICROALBUMIN
Albumin/Creatinine Ratio, Urine, POC: <30
Creatinine, POC: 100 mg/dL
Microalbumin Ur, POC: 10 mg/L

## 2019-06-09 MED ORDER — FINASTERIDE 5 MG PO TABS: 5.0000 mg | ORAL_TABLET | Freq: Every day | ORAL | 3 refills | Status: DC

## 2019-06-09 MED ORDER — QUETIAPINE FUMARATE 200 MG PO TABS: 200.0000 mg | ORAL_TABLET | Freq: Every day | ORAL | 2 refills | Status: DC

## 2019-06-09 MED ORDER — CARBAMAZEPINE 200 MG PO TABS: ORAL_TABLET | ORAL | 2 refills | Status: DC

## 2019-06-09 MED ORDER — TAMSULOSIN HCL 0.4 MG PO CAPS: 0.8000 mg | ORAL_CAPSULE | Freq: Every day | ORAL | 5 refills | Status: DC

## 2019-06-09 NOTE — Progress Notes (Signed)
     Subjective  CC: urinating issues  [id number] Stephen Buckley is a 68 y.o. male who presents today with the following problems:  Post void dribbling, urgency I had a couple of televisit calls with [name] and asked him to come in for in person follow-up.  He described his urinary issues as incontinence as well as urgency.  Today, on interview, he reports that he has not had any incontinence.  He reports that he has had urgency and when he goes to the bathroom he does not urinate that much.  He also experiences post void dribbling.  He has been taking Flomax 0.4 mg.  Pertinent PM/FHx: TBI, history of CVA, chronic hep C, tobacco abuse, obesity, dyslipidemia, hypertension Social History   Tobacco Use  . Smoking status: Current Every Day Smoker    Packs/day: 0.50    Years: 48.00    Pack years: 24.00    Types: Cigarettes  . Smokeless tobacco: Never Used  Substance Use Topics  . Alcohol use: No    Alcohol/week: 0.0 standard drinks    Comment: 05/01/2019- none in 3- 4 years   ROS: Pertinent ROS included in HPI. Objective  Physical Exam:  BP 135/80   Pulse 84   Wt 203 lb 12.8 oz (92.4 kg)   SpO2 99%   BMI 30.10 kg/m  General: Well-appearing male with no acute distress. Abdomen/pelvis: No tenderness to palpation of abdomen or suprapubic area after void.  No enlarged lymph nodes. Rectal exam: Chaperone present.  Enlarged prostate approximately 4 cm in diameter.  No tenderness to palpation.  Point-of-care ultrasound: Bladder decompressed  Assessment & Plan    Problem List Items Addressed This Visit      Active Problems   BPH (benign prostatic hyperplasia)    Patient's history was cleared up today during the visit.  It does not sound like he has had incontinence previously but does complain of urgency.  UA was negative for signs of infection.  Postvoid residual was negative and bladder was decompressed.  Prostate exam significant for enlarged prostate.  This is most likely  due to his BPH.  Will increase tamsulosin to 0.8 mg and start finasteride 5 mg.  Obtaining PSA for baseline prior to starting finasteride.  Patient aware not to start finasteride until this lab has been drawn.  We will follow-up in 6 months.  Patient to call for any worsening of symptoms.      Relevant Medications   finasteride (PROSCAR) 5 MG tablet   tamsulosin (FLOMAX) 0.4 MG CAPS capsule   Other Relevant Orders   PSA (Completed)   Urinary disorder - Primary   Relevant Orders   POCT URINALYSIS DIP (CLINITEK) (Completed)   POCT UA - Microalbumin (Completed)     Stephen Buckley, M.D.  10:45 AM 06/16/2019

## 2019-06-09 NOTE — Patient Instructions (Addendum)
Dear Stephen Buckley,   It was good to see you! Thank you for taking your time to come in to be seen. Today, we discussed the following:   BPH  We will increase the Flomax again to 0.8 mg daily.  I have sent in a new prescription to your pharmacy.  I would also like to start a medication called finasteride.  This medication may take up to 5 to 6 months to improve your symptoms but it will be a good medication to have on board.  Please follow-up in about 6 months or call if you have any concerns sooner  I have attached some information about BPH that I would like you to read to become more familiar with this issue.  Please return to get labs drawn soon as possible, preferably tomorrow, and do not start your finasteride until you have had your labs drawn.    Be well,   Zettie Cooley, M.D   [facility] [phone]  *Sign up for MyChart for instant access to your health profile, labs, orders, upcoming appointments or to contact your provider with questions*  ===================================================================================  Benign Prostatic Hyperplasia  Benign prostatic hyperplasia (BPH) is an enlarged prostate gland that is caused by the normal aging process and not by cancer. The prostate is a walnut-sized gland that is involved in the production of semen. It is located in front of the rectum and below the bladder. The bladder stores urine and the urethra is the tube that carries the urine out of the body. The prostate may get bigger as a man gets older. An enlarged prostate can press on the urethra. This can make it harder to pass urine. The build-up of urine in the bladder can cause infection. Back pressure and infection may progress to bladder damage and kidney (renal) failure. What are the causes? This condition is part of a normal aging process. However, not all men develop problems from this condition. If the prostate enlarges away from the  urethra, urine flow will not be blocked. If it enlarges toward the urethra and compresses it, there will be problems passing urine. What increases the risk? This condition is more likely to develop in men over the age of 19 years. What are the signs or symptoms? Symptoms of this condition include:  Getting up often during the night to urinate.  Needing to urinate frequently during the day.  Difficulty starting urine flow.  Decrease in size and strength of your urine stream.  Leaking (dribbling) after urinating.  Inability to pass urine. This needs immediate treatment.  Inability to completely empty your bladder.  Pain when you pass urine. This is more common if there is also an infection.  Urinary tract infection (UTI). How is this diagnosed? This condition is diagnosed based on your medical history, a physical exam, and your symptoms. Tests will also be done, such as:  A post-void bladder scan. This measures any amount of urine that may remain in your bladder after you finish urinating.  A digital rectal exam. In a rectal exam, your health care provider checks your prostate by putting a lubricated, gloved finger into your rectum to feel the back of your prostate gland. This exam detects the size of your gland and any abnormal lumps or growths.  An exam of your urine (urinalysis).  A prostate specific antigen (PSA) screening. This is a blood test used to screen for prostate cancer.  An ultrasound. This test uses sound waves to electronically produce  a picture of your prostate gland. Your health care provider may refer you to a specialist in kidney and prostate diseases (urologist). How is this treated? Once symptoms begin, your health care provider will monitor your condition (active surveillance or watchful waiting). Treatment for this condition will depend on the severity of your condition. Treatment may include:  Observation and yearly exams. This may be the only treatment  needed if your condition and symptoms are mild.  Medicines to relieve your symptoms, including: ? Medicines to shrink the prostate. ? Medicines to relax the muscle of the prostate.  Surgery in severe cases. Surgery may include: ? Prostatectomy. In this procedure, the prostate tissue is removed completely through an open incision or with a laparoscope or robotics. ? Transurethral resection of the prostate (TURP). In this procedure, a tool is inserted through the opening at the tip of the penis (urethra). It is used to cut away tissue of the inner core of the prostate. The pieces are removed through the same opening of the penis. This removes the blockage. ? Transurethral incision (TUIP). In this procedure, small cuts are made in the prostate. This lessens the prostate's pressure on the urethra. ? Transurethral microwave thermotherapy (TUMT). This procedure uses microwaves to create heat. The heat destroys and removes a small amount of prostate tissue. ? Transurethral needle ablation (TUNA). This procedure uses radio frequencies to destroy and remove a small amount of prostate tissue. ? Interstitial laser coagulation ([place]). This procedure uses a laser to destroy and remove a small amount of prostate tissue. ? Transurethral electrovaporization (TUVP). This procedure uses electrodes to destroy and remove a small amount of prostate tissue. ? Prostatic urethral lift. This procedure inserts an implant to push the lobes of the prostate away from the urethra. Follow these instructions at home:  Take over-the-counter and prescription medicines only as told by your health care provider.  Monitor your symptoms for any changes. Contact your health care provider with any changes.  Avoid drinking large amounts of liquid before going to bed or out in public.  Avoid or reduce how much caffeine or alcohol you drink.  Give yourself time when you urinate.  Keep all follow-up visits as told by your health care  provider. This is important. Contact a health care provider if:  You have unexplained back pain.  Your symptoms do not get better with treatment.  You develop side effects from the medicine you are taking.  Your urine becomes very dark or has a bad smell.  Your lower abdomen becomes distended and you have trouble passing your urine. Get help right away if:  You have a fever or chills.  You suddenly cannot urinate.  You feel lightheaded, or very dizzy, or you faint.  There are large amounts of blood or clots in the urine.  Your urinary problems become hard to manage.  You develop moderate to severe low back or flank pain. The flank is the side of your body between the ribs and the hip. These symptoms may represent a serious problem that is an emergency. Do not wait to see if the symptoms will go away. Get medical help right away. Call your local emergency services (911 in the [place] Do not drive yourself to the hospital. Summary  Benign prostatic hyperplasia (BPH) is an enlarged prostate that is caused by the normal aging process and not by cancer.  An enlarged prostate can press on the urethra. This can make it hard to pass urine.  This condition  is part of a normal aging process and is more likely to develop in men over the age of 13 years.  Get help right away if you suddenly cannot urinate. This information is not intended to replace advice given to you by your health care provider. Make sure you discuss any questions you have with your health care provider. Document Released: 06/11/2005 Document Revised: 05/06/2018 Document Reviewed: 07/16/2016 Elsevier Patient Education  2020 [employer].
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
## 2019-06-10 ENCOUNTER — Other Ambulatory Visit: Payer: Medicare Other

## 2019-06-10 ENCOUNTER — Other Ambulatory Visit: Payer: Self-pay

## 2019-06-10 DIAGNOSIS — R3911 Hesitancy of micturition: Secondary | ICD-10-CM

## 2019-06-10 DIAGNOSIS — N401 Enlarged prostate with lower urinary tract symptoms: Secondary | ICD-10-CM

## 2019-06-11 LAB — PSA: Prostate Specific Ag, Serum: 0.4 ng/mL (ref 0.0–4.0)

## 2019-06-16 NOTE — Assessment & Plan Note (Signed)
Patient's history was cleared up today during the visit.  It does not sound like he has had incontinence previously but does complain of urgency.  UA was negative for signs of infection.  Postvoid residual was negative and bladder was decompressed.  Prostate exam significant for enlarged prostate.  This is most likely due to his BPH.  Will increase tamsulosin to 0.8 mg and start finasteride 5 mg.  Obtaining PSA for baseline prior to starting finasteride.  Patient aware not to start finasteride until this lab has been drawn.  We will follow-up in 6 months.  Patient to call for any worsening of symptoms.

## 2019-06-22 ENCOUNTER — Other Ambulatory Visit: Payer: Self-pay | Admitting: Family Medicine

## 2019-07-11 ENCOUNTER — Other Ambulatory Visit: Payer: Self-pay | Admitting: Neurology

## 2019-07-17 ENCOUNTER — Encounter: Payer: Self-pay | Admitting: Family Medicine

## 2019-07-17 ENCOUNTER — Telehealth (INDEPENDENT_AMBULATORY_CARE_PROVIDER_SITE_OTHER): Payer: Medicare Other | Admitting: Family Medicine

## 2019-07-17 ENCOUNTER — Other Ambulatory Visit: Payer: Self-pay

## 2019-07-17 DIAGNOSIS — J410 Simple chronic bronchitis: Secondary | ICD-10-CM | POA: Diagnosis not present

## 2019-07-17 DIAGNOSIS — J3489 Other specified disorders of nose and nasal sinuses: Secondary | ICD-10-CM

## 2019-07-17 DIAGNOSIS — R413 Other amnesia: Secondary | ICD-10-CM | POA: Diagnosis not present

## 2019-07-17 NOTE — Progress Notes (Signed)
[place] Telemedicine Visit  Patient consented to have virtual visit. Method of visit: Video was attempted, but technology challenges prevented patient from using video, so visit was conducted via telephone.  Encounter participants: Patient: Stephen Buckley - located at Home Provider: Wilber Oliphant - located at [facility] Others (if applicable):   Chief Complaint: Chronic coughing   HPI: Patient notes that he has been asked about his cough by several medical professionals. He reports that he has been smoking for a "very long time". Denies difficulty breathing. He denies any fevers or chills. His cough is productive, but with clear phlegm. I have heard his cough in clinic multiple times.   Patient also reports runny nose for a long time, which has been bothering him. He reports that the running is improved with Flonase, but sometimes he wants to use it twice a day, but doesn't know if he can.   ROS: per HPI   Exam:  Respiratory: speaking in full sentences. No respiratory distress.   Assessment/Plan:  No problem-specific Assessment & Plan notes found for this encounter.   Other issues he would like to address: scheduled f/u Future Appointments  Date Time Provider [place]  07/27/2019  3:10 PM Wilber Oliphant, MD [facility]  09/07/2019  3:40 PM Arfeen, Arlyce Harman, MD [facility] None  09/09/2019  8:45 AM Suzzanne Cloud, NP [facility] None   - feels like he is going to fall  - numb hand    Time spent during visit with patient: 13 minutes  Wilber Oliphant, M.D.  11:47 AM 07/17/2019

## 2019-07-17 NOTE — Assessment & Plan Note (Signed)
Patient denies any shortness of breath or dyspnea on exertion.  On the virtual visit today, he is speaking in full sentences.  I have heard his cough in the clinic previously, it is productive.  This is likely due to many years of smoking.  It appears that he has had this previously worked up.  Reassured patient and encouraged tobacco cessation.

## 2019-07-17 NOTE — Assessment & Plan Note (Signed)
Continue Flonase. Can use BID if needed. If he experience epistaxis, dryness, or pain, he should decrease use to once daily or as needed.

## 2019-07-17 NOTE — Assessment & Plan Note (Signed)
Patient does report that he has some memory difficulty.  I have reassured him that I will continue to remind him about any health issues that he has as well as in the past.  Patient is status post TIA, CVA, seizure disorder.  Should attempt [place] in the future.

## 2019-07-27 ENCOUNTER — Telehealth (INDEPENDENT_AMBULATORY_CARE_PROVIDER_SITE_OTHER): Payer: Medicare Other | Admitting: Family Medicine

## 2019-07-27 ENCOUNTER — Encounter: Payer: Self-pay | Admitting: Family Medicine

## 2019-07-27 ENCOUNTER — Other Ambulatory Visit: Payer: Self-pay

## 2019-07-27 DIAGNOSIS — R252 Cramp and spasm: Secondary | ICD-10-CM | POA: Diagnosis not present

## 2019-07-27 NOTE — Assessment & Plan Note (Signed)
Patient reports that he is having cramps all over his body throughout random times of the day.  He reports this has been going on for about a month.  Differential includes dehydration, medication, sedentary lifestyle.  Patient reports that he has cramping in his left upper chest which is concerning to him.  On further investigation, patient's pain is in the axillary region and he reports it as a sharp pain.  He denies any anterior chest pressure or dull pain, no radiation to arms.  He has never had an MI in the past, though he does have several risk factors.  Given that the cramping happens when he is sitting there and improves with movement and stretching, I do not believe that this is ACS.  Instructed patient to stay hydrated and perform some gentle stretches daily.  Follow-up as needed.

## 2019-07-27 NOTE — Progress Notes (Signed)
[place] Telemedicine Visit  Patient consented to have virtual visit. Method of visit: Video  Encounter participants: Patient: Stephen Buckley - located in car Provider: Wilber Oliphant - located at [facility] Others (if applicable): pt's wife  Chief Complaint: Cramps  HPI:  Cramps  Left side, chest, legs, toes, arms and fingers. Occurs with movement around in his chair or bed. Has not happened with walking. Pain goes away with more movement. Has been going on for a month, patient reports that it has been stable. He reports that he does not drink much water throughout the day.   ROS: per HPI  Pertinent PMHx: CVA, HTN, HLD, smoker, obesity   Exam:  General: appears well.  Respiratory: breathing comfortably. NAD.   Assessment/Plan:  Muscle cramping Patient reports that he is having cramps all over his body throughout random times of the day.  He reports this has been going on for about a month.  Differential includes dehydration, medication, sedentary lifestyle.  Patient reports that he has cramping in his left upper chest which is concerning to him.  On further investigation, patient's pain is in the axillary region and he reports it as a sharp pain.  He denies any anterior chest pressure or dull pain, no radiation to arms.  He has never had an MI in the past, though he does have several risk factors.  Given that the cramping happens when he is sitting there and improves with movement and stretching, I do not believe that this is ACS.  Instructed patient to stay hydrated and perform some gentle stretches daily.  Follow-up as needed.    Time spent during visit with patient: 23 minutes

## 2019-08-10 ENCOUNTER — Other Ambulatory Visit: Payer: Self-pay | Admitting: Family Medicine

## 2019-09-07 ENCOUNTER — Ambulatory Visit (INDEPENDENT_AMBULATORY_CARE_PROVIDER_SITE_OTHER): Payer: Medicare Other | Admitting: Psychiatry

## 2019-09-07 ENCOUNTER — Encounter (HOSPITAL_COMMUNITY): Payer: Self-pay | Admitting: Psychiatry

## 2019-09-07 ENCOUNTER — Other Ambulatory Visit: Payer: Self-pay

## 2019-09-07 DIAGNOSIS — F09 Unspecified mental disorder due to known physiological condition: Secondary | ICD-10-CM | POA: Diagnosis not present

## 2019-09-07 DIAGNOSIS — F319 Bipolar disorder, unspecified: Secondary | ICD-10-CM

## 2019-09-07 MED ORDER — QUETIAPINE FUMARATE 200 MG PO TABS: 200.0000 mg | ORAL_TABLET | Freq: Every day | ORAL | 2 refills | Status: DC

## 2019-09-07 MED ORDER — CARBAMAZEPINE 200 MG PO TABS: ORAL_TABLET | ORAL | 2 refills | Status: DC
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
## 2019-09-09 ENCOUNTER — Ambulatory Visit: Payer: Medicare Other | Admitting: Neurology

## 2019-09-19 ENCOUNTER — Other Ambulatory Visit: Payer: Self-pay | Admitting: Family Medicine

## 2019-10-07 ENCOUNTER — Other Ambulatory Visit: Payer: Self-pay | Admitting: Family Medicine

## 2019-10-07 DIAGNOSIS — J302 Other seasonal allergic rhinitis: Secondary | ICD-10-CM

## 2019-10-10 ENCOUNTER — Telehealth: Payer: Self-pay | Admitting: Family Medicine

## 2019-10-10 NOTE — Telephone Encounter (Signed)
**  After-Hours/Emergency line Call**  Stephen Buckley is a 69 year old gentleman with a history of CVA, hypertension, hep C s/p treatment, seizure disorder, and memory difficulty calling into the after-hours line for "a lot of coughing, wanted to make sure if it was okay to take cold drops"   He reports a chronic smoker's cough, not more frequent or severe above his baseline.  No associated difficulty breathing or fever. He has already received both doses of his Covid vaccine.  Discussed it would be reasonable to continue OTC cold drops, low concern for these interacting with his current medications.  Additionally recommended tea with honey and smoking cessation.  He would also like to follow-up with Dr. Maudie Mercury to discuss chronic conditions, scheduled him on 4/29 at 3:50 PM.   Routed to PCP to ensure he maintains an in-office per patient request (often transition to virtual when he reports his chronic cough during COVID screening).   Patriciaann Clan, DO

## 2019-10-12 ENCOUNTER — Ambulatory Visit: Payer: Medicare Other | Admitting: Neurology
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
## 2019-10-19 ENCOUNTER — Other Ambulatory Visit: Payer: Self-pay | Admitting: Neurology

## 2019-10-19 MED ORDER — MEMANTINE HCL 10 MG PO TABS: 10.0000 mg | ORAL_TABLET | Freq: Two times a day (BID) | ORAL | 3 refills | Status: DC

## 2019-10-22 ENCOUNTER — Encounter: Payer: Self-pay | Admitting: Family Medicine

## 2019-10-22 ENCOUNTER — Other Ambulatory Visit: Payer: Self-pay

## 2019-10-22 ENCOUNTER — Ambulatory Visit (INDEPENDENT_AMBULATORY_CARE_PROVIDER_SITE_OTHER): Payer: Medicare HMO | Admitting: Family Medicine

## 2019-10-22 VITALS — BP 164/92 | HR 92 | Ht 69.0 in | Wt 210.6 lb

## 2019-10-22 DIAGNOSIS — I10 Essential (primary) hypertension: Secondary | ICD-10-CM | POA: Diagnosis not present

## 2019-10-22 DIAGNOSIS — K219 Gastro-esophageal reflux disease without esophagitis: Secondary | ICD-10-CM

## 2019-10-22 DIAGNOSIS — R209 Unspecified disturbances of skin sensation: Secondary | ICD-10-CM | POA: Diagnosis not present

## 2019-10-22 DIAGNOSIS — Z720 Tobacco use: Secondary | ICD-10-CM | POA: Diagnosis not present

## 2019-10-22 DIAGNOSIS — J410 Simple chronic bronchitis: Secondary | ICD-10-CM

## 2019-10-22 MED ORDER — AMLODIPINE BESYLATE 10 MG PO TABS: 10.0000 mg | ORAL_TABLET | Freq: Every day | ORAL | 3 refills | Status: DC

## 2019-10-22 MED ORDER — BLOOD PRESSURE MONITOR AUTOMAT DEVI: 1.0000 | Freq: Every day | 0 refills | Status: AC

## 2019-10-22 MED ORDER — PANTOPRAZOLE SODIUM 40 MG PO TBEC: 40.0000 mg | DELAYED_RELEASE_TABLET | Freq: Every day | ORAL | 3 refills | Status: DC

## 2019-10-22 NOTE — Progress Notes (Signed)
    SUBJECTIVE:  CHIEF COMPLAINT / HPI:   Cold Arm (Left side)    Patient reports that his arm gets cold if it is above with the blanket.  If it is below the blanket, it gets warm again.  He has been thinking a lot and thinks about the lesions and cuts he has got on his left arm and thinks that perhaps he lost a lot of blood previously which is causing his coldness.  Cough  Patient is frustrated by his chronic cough.  Patient continues to smoke.  Is wondering if there are any over-the-counter medications that would be able to help him.  Blood pressure: Blood pressure is elevated as recent visits.  Throwing up Patient reports that he is having episodes of emesis when he lays down at night.  He reports he can feel food coming up his esophagus and stimulates his gag reflex.  This is happened a few times.  He is currently taking omeprazole.  He denies any pattern in food intake that leads to vomiting  PERTINENT  PMH / PSH: TBI, smoker's cough, CVA, behavioral difficulties after TBI, hypertension  OBJECTIVE:  BP (!) 164/92   Pulse 92   Ht 5\' 9"  (1.753 m)   Wt 210 lb 9.6 oz (95.5 kg)   SpO2 97%   BMI 31.10 kg/m   Well-appearing male, no acute distress Lungs: Diffuse rhonchi throughout.  Good air movement. Extremities: Moving spontaneously.  5 out of 5 strength.  Radial pulses palpable bilaterally.  Normal cap refill bilaterally.  Different types and size of lesions scattered over patient's arm.  All scars appear well-healed  ASSESSMENT/PLAN:  Tobacco use Encourage patient to quit smoking.  Essential hypertension Patient's blood pressures have been elevated over the last few visits.  Will increase amlodipine to 10 mg daily.  Patient to follow-up in 3 to 4 weeks for follow-up.  Also sending blood pressure monitor to pharmacy.  Patient instructed to write down daily blood pressures.  Abnormal arm sensation Unclear what may be causing this arm coldness.  On physical exam, patient has no  abnormalities from a neurovascular standpoint.  We will continue to monitor.  Encouraged patient to keep arm under a blanket.  Gastroesophageal reflux disease Patient having recent worsening indigestion.  He reports that he has had episodes of emesis after feeling that food was coming up his esophagus.  Current taking omeprazole.  Will switch over to pantoprazole to see if there is any improvement.  Patient encouraged to not lay flat after eating for at least 2 hours.  Smokers' cough ([place]) Encouraged patient to quit smoking. Lung exam s/f rhonchi bilaterally. Otherwise, good air movement. There are at home therapies that patient can try, but unsure of efficacy. Per Dr. Owens Shark, there are inhalers that have been shown to improve smoker's cough. Will obtain further information and discuss with patient at HTN follow up.     Wilber Oliphant, MD [place]

## 2019-10-22 NOTE — Progress Notes (Deleted)
    SUBJECTIVE:  CHIEF COMPLAINT / HPI:   Wants to discuss [place] conditions   PROVIDER AGENDA:  ***   Adult vaccines due  Topic Date Due  . TETANUS/TDAP  07/10/2021     Review Medication List   PERTINENT  PMH / PSH: ***  Medication List was reviewed and updated.   OBJECTIVE:  There were no vitals taken for this visit.  ***  ASSESSMENT/PLAN:  No problem-specific Assessment & Plan notes found for this encounter.     Wilber Oliphant, MD [place]

## 2019-10-22 NOTE — Patient Instructions (Addendum)
Dear Stephen Buckley,   It was good to see you! Thank you for taking your time to come in to be seen. Today, we discussed the following:   Cough:  There is not much we can do about your coughing. That is a result of changes in your lung after smoking for many years.   Throwing up We are changing your medications. STOP taking the omeprazole. Start taking the protonix.   Blood Pressure Your blood pressure has been elevated at the last few appointments. We are starting a new medication called amlodipine. Take this every night. Take your blood pressure at the same time every day and write the numbers down. Come back in 3-4 weeks. We will get some labs at that time.    Be well,   Zettie Cooley, M.D   [facility] [phone]  *Sign up for MyChart for instant access to your health profile, labs, orders, upcoming appointments or to contact your provider with questions*  ===================================================================================

## 2019-10-25 ENCOUNTER — Encounter: Payer: Self-pay | Admitting: Family Medicine

## 2019-10-25 DIAGNOSIS — R209 Unspecified disturbances of skin sensation: Secondary | ICD-10-CM | POA: Insufficient documentation

## 2019-10-25 NOTE — Assessment & Plan Note (Signed)
Encourage patient to quit smoking.

## 2019-10-25 NOTE — Assessment & Plan Note (Signed)
Unclear what may be causing this arm coldness.  On physical exam, patient has no abnormalities from a neurovascular standpoint.  We will continue to monitor.  Encouraged patient to keep arm under a blanket.

## 2019-10-25 NOTE — Assessment & Plan Note (Signed)
Patient having recent worsening indigestion.  He reports that he has had episodes of emesis after feeling that food was coming up his esophagus.  Current taking omeprazole.  Will switch over to pantoprazole to see if there is any improvement.  Patient encouraged to not lay flat after eating for at least 2 hours.

## 2019-10-25 NOTE — Assessment & Plan Note (Signed)
Encouraged patient to quit smoking. Lung exam s/f rhonchi bilaterally. Otherwise, good air movement. There are at home therapies that patient can try, but unsure of efficacy. Per Dr. Owens Shark, there are inhalers that have been shown to improve smoker's cough. Will obtain further information and discuss with patient at HTN follow up.

## 2019-10-25 NOTE — Assessment & Plan Note (Signed)
Patient's blood pressures have been elevated over the last few visits.  Will increase amlodipine to 10 mg daily.  Patient to follow-up in 3 to 4 weeks for follow-up.  Also sending blood pressure monitor to pharmacy.  Patient instructed to write down daily blood pressures.

## 2019-12-03 ENCOUNTER — Other Ambulatory Visit: Payer: Self-pay

## 2019-12-03 ENCOUNTER — Ambulatory Visit (INDEPENDENT_AMBULATORY_CARE_PROVIDER_SITE_OTHER): Payer: Medicare Other | Admitting: Family Medicine

## 2019-12-03 ENCOUNTER — Encounter: Payer: Self-pay | Admitting: Family Medicine

## 2019-12-03 VITALS — BP 152/84 | HR 99 | Ht 69.0 in | Wt 212.4 lb

## 2019-12-03 DIAGNOSIS — J410 Simple chronic bronchitis: Secondary | ICD-10-CM | POA: Diagnosis not present

## 2019-12-03 DIAGNOSIS — E785 Hyperlipidemia, unspecified: Secondary | ICD-10-CM

## 2019-12-03 DIAGNOSIS — Z720 Tobacco use: Secondary | ICD-10-CM | POA: Diagnosis not present

## 2019-12-03 DIAGNOSIS — I10 Essential (primary) hypertension: Secondary | ICD-10-CM | POA: Diagnosis not present

## 2019-12-03 MED ORDER — NICOTINE 21 MG/24HR TD PT24: 21.0000 mg | MEDICATED_PATCH | Freq: Every day | TRANSDERMAL | 1 refills | Status: DC

## 2019-12-03 MED ORDER — INCRUSE ELLIPTA 62.5 MCG/INH IN AEPB: 1.0000 | INHALATION_SPRAY | Freq: Every day | RESPIRATORY_TRACT | 11 refills | Status: DC
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
## 2019-12-03 NOTE — Assessment & Plan Note (Signed)
Recheck lipid panel today.  Continue atorvastatin.

## 2019-12-03 NOTE — Patient Instructions (Addendum)
Smoker's cough: We are starting you on an inhaler called Incruse Ellipta.  This will hopefully help decrease the amount of sputum that your body is making.  If you experience any difficulty with urination or painful urination, please let us know. Please ask the pharmacist how to take this medication before you leave the pharmacy.  Please take this medication daily at the same time.  High Blood Pressure: I will call you tomorrow to see which amlodipine dose you have been taking.  I would like you to be on 10 mg.  If that is what you have been taking, I will add another medication to your list.  Quitting smoking: I am so happy that you are ready to quit smoking. Congratulations!!  I am sending the patch to the pharmacy.  Please put on 1 patch every day.  I have also included information in the packet about quitting smoking. I have also included information about the patch. Please read prior to use!      Coping with Quitting Smoking  Quitting smoking is a physical and mental challenge. You will face cravings, withdrawal symptoms, and temptation. Before quitting, work with your health care provider to make a plan that can help you cope. Preparation can help you quit and keep you from giving in. How can I cope with cravings? Cravings usually last for 5-10 minutes. If you get through it, the craving will pass. Consider taking the following actions to help you cope with cravings:  Keep your mouth busy: ? Chew sugar-free gum. ? Suck on hard candies or a straw. ? Brush your teeth.  Keep your hands and body busy: ? Immediately change to a different activity when you feel a craving. ? Squeeze or play with a ball. ? Do an activity or a hobby, like making bead jewelry, practicing needlepoint, or working with wood. ? Mix up your normal routine. ? Take a short exercise break. Go for a quick walk or run up and down stairs. ? Spend time in public places where smoking is not allowed.  Focus on doing  something kind or helpful for someone else.  Call a friend or family member to talk during a craving.  Join a support group.  Call a quit line, such as 1-800-QUIT-NOW.  Talk with your health care provider about medicines that might help you cope with cravings and make quitting easier for you. How can I deal with withdrawal symptoms? Your body may experience negative effects as it tries to get used to not having nicotine in the system. These effects are called withdrawal symptoms. They may include:  Feeling hungrier than normal.  Trouble concentrating.  Irritability.  Trouble sleeping.  Feeling depressed.  Restlessness and agitation.  Craving a cigarette. To manage withdrawal symptoms:  Avoid places, people, and activities that trigger your cravings.  Remember why you want to quit.  Get plenty of sleep.  Avoid coffee and other caffeinated drinks. These may worsen some of your symptoms. How can I handle social situations? Social situations can be difficult when you are quitting smoking, especially in the first few weeks. To manage this, you can:  Avoid parties, bars, and other social situations where people might be smoking.  Avoid alcohol.  Leave right away if you have the urge to smoke.  Explain to your family and friends that you are quitting smoking. Ask for understanding and support.  Plan activities with friends or family where smoking is not an option. What are some ways I can  cope with stress? Wanting to smoke may cause stress, and stress can make you want to smoke. Find ways to manage your stress. Relaxation techniques can help. For example:  Breathe slowly and deeply, in through your nose and out through your mouth.  Listen to soothing, relaxing music.  Talk with a family member or friend about your stress.  Light a candle.  Soak in a bath or take a shower.  Think about a peaceful place. What are some ways I can prevent weight gain? Be aware that  many people gain weight after they quit smoking. However, not everyone does. To keep from gaining weight, have a plan in place before you quit and stick to the plan after you quit. Your plan should include:  Having healthy snacks. When you have a craving, it may help to: ? Eat plain popcorn, crunchy carrots, celery, or other cut vegetables. ? Chew sugar-free gum.  Changing how you eat: ? Eat small portion sizes at meals. ? Eat 4-6 small meals throughout the day instead of 1-2 large meals a day. ? Be mindful when you eat. Do not watch television or do other things that might distract you as you eat.  Exercising regularly: ? Make time to exercise each day. If you do not have time for a long workout, do short bouts of exercise for 5-10 minutes several times a day. ? Do some form of strengthening exercise, like weight lifting, and some form of aerobic exercise, like running or swimming.  Drinking plenty of water or other low-calorie or no-calorie drinks. Drink 6-8 glasses of water daily, or as much as instructed by your health care provider. Summary  Quitting smoking is a physical and mental challenge. You will face cravings, withdrawal symptoms, and temptation to smoke again. Preparation can help you as you go through these challenges.  You can cope with cravings by keeping your mouth busy (such as by chewing gum), keeping your body and hands busy, and making calls to family, friends, or a helpline for people who want to quit smoking.  You can cope with withdrawal symptoms by avoiding places where people smoke, avoiding drinks with caffeine, and getting plenty of rest.  Ask your health care provider about the different ways to prevent weight gain, avoid stress, and handle social situations. This information is not intended to replace advice given to you by your health care provider. Make sure you discuss any questions you have with your health care provider. Document Revised: 05/24/2017  Document Reviewed: 06/08/2016 Elsevier Patient Education  [place].    Nicotine skin patches What is this medicine? NICOTINE ([place]) helps people stop smoking. The patches replace the nicotine found in cigarettes and help to decrease withdrawal effects. They are most effective when used in combination with a stop-smoking program. This medicine may be used for other purposes; ask your health care provider or pharmacist if you have questions. COMMON BRAND NAME(S): Habitrol, Nicoderm CQ, Nicotrol What should I tell my health care provider before I take this medicine? They need to know if you have any of these conditions:  diabetes  heart disease, angina, irregular heartbeat or previous heart attack  high blood pressure  lung disease, including asthma  overactive thyroid  pheochromocytoma  seizures or a history of seizures  skin problems, like eczema  stomach problems or ulcers  an unusual or allergic reaction to nicotine, adhesives, other medicines, foods, dyes, or preservatives  pregnant or trying to get pregnant  breast-feeding How  should I use this medicine? This medicine is for use on the skin. Follow the directions that come with the patches. Find an area of skin on your upper arm, chest, or back that is clean, dry, greaseless, undamaged and hairless. Wash hands with plain soap and water. Do not use anything that contains aloe, lanolin or glycerin as these may prevent the patch from sticking. Dry thoroughly. Remove the patch from the sealed pouch. Do not try to cut or trim the patch. Using your palm, press the patch firmly in place for 10 seconds to make sure that there is good contact with your skin. After applying the patch, wash your hands. Change the patch every day, keeping to a regular schedule. When you apply a new patch, use a new area of skin. Wait at least 1 week before using the same area again. Talk to your pediatrician regarding the use of this  medicine in children. Special care may be needed. Overdosage: If you think you have taken too much of this medicine contact a poison control center or emergency room at once. NOTE: This medicine is only for you. Do not share this medicine with others. What if I miss a dose? If you forget to replace a patch, use it as soon as you can. Only use one patch at a time and do not leave on the skin for longer than directed. If a patch falls off, you can replace it, but keep to your schedule and remove the patch at the right time. What may interact with this medicine?  medicines for asthma  medicines for blood pressure  medicines for mental depression This list may not describe all possible interactions. Give your health care provider a list of all the medicines, herbs, non-prescription drugs, or dietary supplements you use. Also tell them if you smoke, drink alcohol, or use illegal drugs. Some items may interact with your medicine. What should I watch for while using this medicine? You should begin using the nicotine patch the day you stop smoking. It is okay if you do not succeed at your attempt to quit and have a cigarette. You can still continue your quit attempt and keep using the product as directed. Just throw away your cigarettes and get back to your quit plan. You can keep the patch in place during swimming, bathing, and showering. If your patch falls off during these activities, replace it. When you first apply the patch, your skin may itch or burn. This should go away soon. When you remove a patch, the skin may look red, but this should only last for a few days. Call your doctor or health care professional if skin redness does not go away after 4 days, if your skin swells, or if you get a rash. If you are a diabetic and you quit smoking, the effects of insulin may be increased and you may need to reduce your insulin dose. Check with your doctor or health care professional about how you should  adjust your insulin dose. If you are going to have a magnetic resonance imaging (MRI) procedure, tell your MRI technician if you have this patch on your body. It must be removed before a MRI. What side effects may I notice from receiving this medicine? Side effects that you should report to your doctor or health care professional as soon as possible:  allergic reactions like skin rash, itching or hives, swelling of the face, lips, or tongue  breathing problems  changes in hearing  changes in vision  chest pain  cold sweats  confusion  fast, irregular heartbeat  feeling faint or lightheaded, falls  headache  increased saliva  skin redness that lasts more than 4 days  stomach pain  signs and symptoms of nicotine overdose like nausea; vomiting; dizziness; weakness; and rapid heartbeat Side effects that usually do not require medical attention (report to your doctor or health care professional if they continue or are bothersome):  diarrhea  dry mouth  hiccups  irritability  nervousness or restlessness  trouble sleeping or vivid dreams This list may not describe all possible side effects. Call your doctor for medical advice about side effects. You may report side effects to FDA at 1-800-FDA-1088. Where should I keep my medicine? Keep out of the reach of children. Store at room temperature between 20 and 25 degrees C (68 and 77 degrees F). Protect from heat and light. Store in International aid/development worker until ready to use. Throw away unused medicine after the expiration date. When you remove a patch, fold with sticky sides together; put in an empty opened pouch and throw away. NOTE: This sheet is a summary. It may not cover all possible information. If you have questions about this medicine, talk to your doctor, pharmacist, or health care provider.  2020 Elsevier/Gold Standard (2014-05-10 15:46:21)

## 2019-12-03 NOTE — Assessment & Plan Note (Addendum)
Patient is ready to quit smoking.  He reports that he is smoking an alarming amount to where it scares him.  He wakes up in the morning with a hoarse voice and continues to have this chronic cough.  We will start Incruse for patient (ease of dosing, 1 puff once daily).  Also sending nicotine patches, 21 to the pharmacy.  Patient provided with nicotine patch information as well as quitting smoking information. Patient congratulated on his choice to quit smoking.  Patient is due to low dose CT lung cancer screening annually.

## 2019-12-03 NOTE — Assessment & Plan Note (Signed)
Patient is unsure whether he is taking 5 mg or 10 mg of amlodipine.  Will call him tomorrow to ensure correct medication.  If he has been taking 10 mg of amlodipine, will likely add on another agent.

## 2019-12-04 LAB — LIPID PANEL
Chol/HDL Ratio: 4.6 ratio (ref 0.0–5.0)
Cholesterol, Total: 139 mg/dL (ref 100–199)
HDL: 30 mg/dL — ABNORMAL LOW (ref >39)
LDL Chol Calc (NIH): 89 mg/dL (ref 0–99)
Triglycerides: 109 mg/dL (ref 0–149)
VLDL Cholesterol Cal: 20 mg/dL (ref 5–40)

## 2019-12-08 ENCOUNTER — Encounter (HOSPITAL_COMMUNITY): Payer: Self-pay | Admitting: Psychiatry

## 2019-12-08 ENCOUNTER — Other Ambulatory Visit: Payer: Self-pay

## 2019-12-08 ENCOUNTER — Telehealth (INDEPENDENT_AMBULATORY_CARE_PROVIDER_SITE_OTHER): Payer: Medicare Other | Admitting: Psychiatry

## 2019-12-08 DIAGNOSIS — F09 Unspecified mental disorder due to known physiological condition: Secondary | ICD-10-CM

## 2019-12-08 DIAGNOSIS — F319 Bipolar disorder, unspecified: Secondary | ICD-10-CM | POA: Diagnosis not present

## 2019-12-08 MED ORDER — CARBAMAZEPINE 200 MG PO TABS: ORAL_TABLET | ORAL | 2 refills | Status: DC

## 2019-12-08 MED ORDER — QUETIAPINE FUMARATE 200 MG PO TABS: 200.0000 mg | ORAL_TABLET | Freq: Every day | ORAL | 2 refills | Status: DC
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
## 2019-12-11 ENCOUNTER — Other Ambulatory Visit: Payer: Self-pay

## 2019-12-11 ENCOUNTER — Telehealth: Payer: Self-pay | Admitting: Gastroenterology

## 2019-12-11 DIAGNOSIS — R933 Abnormal findings on diagnostic imaging of other parts of digestive tract: Secondary | ICD-10-CM

## 2019-12-11 DIAGNOSIS — D122 Benign neoplasm of ascending colon: Secondary | ICD-10-CM

## 2019-12-11 NOTE — Telephone Encounter (Signed)
The pt needs a recall colon EMR 90 min at hospital with Dr Jerilynn Mages.  Need endorotor available.  Pt wife aware we will call when set up.

## 2019-12-11 NOTE — Telephone Encounter (Signed)
Pt's wife called to schedule recall EMR.

## 2019-12-11 NOTE — Telephone Encounter (Signed)
Colon EMR scheduled for [facility] on 02/01/20 at 11 am-COVID test on 01/28/20 at 1005 am.

## 2019-12-14 NOTE — Telephone Encounter (Signed)
Line rings busy will mail information to the pt.  I have tried several times to reach the pt.

## 2019-12-18 ENCOUNTER — Other Ambulatory Visit: Payer: Self-pay | Admitting: Neurology

## 2019-12-24 ENCOUNTER — Other Ambulatory Visit: Payer: Self-pay

## 2019-12-25 MED ORDER — LORAZEPAM 0.5 MG PO TABS: 0.5000 mg | ORAL_TABLET | Freq: Two times a day (BID) | ORAL | 3 refills | Status: DC | PRN

## 2019-12-31 NOTE — Telephone Encounter (Signed)
This was done 12-25-19 by Dr. Jannifer Franklin.

## 2020-01-19 ENCOUNTER — Telehealth: Payer: Self-pay | Admitting: Gastroenterology

## 2020-01-19 NOTE — Telephone Encounter (Signed)
The pt has been advised that the prep is over the counter.  He was not sure if he ever received the instructions in the mail so I re mailed today.

## 2020-01-28 ENCOUNTER — Other Ambulatory Visit (HOSPITAL_COMMUNITY)
Admission: RE | Admit: 2020-01-28 | Discharge: 2020-01-28 | Disposition: A | Discharge status: Home / Self Care | Payer: Medicare Other | Source: Ambulatory Visit | Attending: Gastroenterology | Admitting: Gastroenterology

## 2020-01-28 DIAGNOSIS — Z01812 Encounter for preprocedural laboratory examination: Secondary | ICD-10-CM | POA: Insufficient documentation

## 2020-01-28 DIAGNOSIS — Z20822 Contact with and (suspected) exposure to covid-19: Secondary | ICD-10-CM | POA: Insufficient documentation

## 2020-01-28 LAB — SARS CORONAVIRUS 2 (TAT 6-24 HRS): SARS Coronavirus 2: NEGATIVE

## 2020-01-29 ENCOUNTER — Encounter (HOSPITAL_COMMUNITY): Payer: Self-pay | Admitting: Gastroenterology

## 2020-01-29 ENCOUNTER — Telehealth: Payer: Self-pay | Admitting: Gastroenterology

## 2020-01-29 NOTE — Telephone Encounter (Signed)
Patients wife is calling with questions in reference to his upcoming procedure at the hospital

## 2020-01-29 NOTE — Telephone Encounter (Signed)
The pt wanted to confirm that he does NOT need to stop ASA prior to his procedure.  The pt has been advised of the information and verbalized understanding.

## 2020-01-29 NOTE — Progress Notes (Signed)
Patient denies shortness of breath, fever, cough or chest pain.  PCP - Dr Zettie Cooley Cardiologist - n/a Behavioral - Dr Karrie Meres - Dr Rush [facility] Neuro - Dr Jannifer Franklin  Chest x-ray - n/a EKG - DOS 02/01/20 Stress Test - n/a ECHO - 09/04/17 Cardiac Cath - n/a  Aspirin Instructions: Follow your surgeon's instructions on when to stop aspirin prior to surgery,  If no instructions were given by your surgeon then you will need to call the office for those instructions.  Anesthesia review: Yes  STOP now taking any Aspirin (unless otherwise instructed by your surgeon), Aleve, Naproxen, Ibuprofen, Motrin, Advil, Goody's, BC's, all herbal medications, fish oil, and all vitamins.   Coronavirus Screening Covid test on 01/28/20 was negative.  Patient and Wife Stephen Buckley verbalized understanding of instructions that were given via phone.

## 2020-02-01 ENCOUNTER — Other Ambulatory Visit: Payer: Self-pay

## 2020-02-01 ENCOUNTER — Ambulatory Visit (HOSPITAL_COMMUNITY): Payer: Medicare Other | Admitting: Physician Assistant

## 2020-02-01 ENCOUNTER — Encounter (HOSPITAL_COMMUNITY)
Admission: RE | Disposition: A | Discharge status: Home / Self Care | Payer: Self-pay | Source: Home / Self Care | Attending: Gastroenterology

## 2020-02-01 ENCOUNTER — Encounter (HOSPITAL_COMMUNITY): Payer: Self-pay | Admitting: Gastroenterology

## 2020-02-01 ENCOUNTER — Ambulatory Visit (HOSPITAL_COMMUNITY)
Admission: RE | Admit: 2020-02-01 | Discharge: 2020-02-01 | Disposition: A | Discharge status: Home / Self Care | Payer: Medicare Other | Attending: Gastroenterology | Admitting: Gastroenterology

## 2020-02-01 DIAGNOSIS — H409 Unspecified glaucoma: Secondary | ICD-10-CM | POA: Diagnosis not present

## 2020-02-01 DIAGNOSIS — K5730 Diverticulosis of large intestine without perforation or abscess without bleeding: Secondary | ICD-10-CM | POA: Insufficient documentation

## 2020-02-01 DIAGNOSIS — Z87820 Personal history of traumatic brain injury: Secondary | ICD-10-CM | POA: Insufficient documentation

## 2020-02-01 DIAGNOSIS — K644 Residual hemorrhoidal skin tags: Secondary | ICD-10-CM | POA: Diagnosis not present

## 2020-02-01 DIAGNOSIS — Z833 Family history of diabetes mellitus: Secondary | ICD-10-CM | POA: Diagnosis not present

## 2020-02-01 DIAGNOSIS — Z811 Family history of alcohol abuse and dependence: Secondary | ICD-10-CM | POA: Diagnosis not present

## 2020-02-01 DIAGNOSIS — J45909 Unspecified asthma, uncomplicated: Secondary | ICD-10-CM | POA: Diagnosis not present

## 2020-02-01 DIAGNOSIS — Z9889 Other specified postprocedural states: Secondary | ICD-10-CM | POA: Diagnosis not present

## 2020-02-01 DIAGNOSIS — F329 Major depressive disorder, single episode, unspecified: Secondary | ICD-10-CM | POA: Diagnosis not present

## 2020-02-01 DIAGNOSIS — K621 Rectal polyp: Secondary | ICD-10-CM | POA: Insufficient documentation

## 2020-02-01 DIAGNOSIS — I10 Essential (primary) hypertension: Secondary | ICD-10-CM | POA: Insufficient documentation

## 2020-02-01 DIAGNOSIS — F0390 Unspecified dementia without behavioral disturbance: Secondary | ICD-10-CM | POA: Diagnosis not present

## 2020-02-01 DIAGNOSIS — K219 Gastro-esophageal reflux disease without esophagitis: Secondary | ICD-10-CM | POA: Diagnosis not present

## 2020-02-01 DIAGNOSIS — Z09 Encounter for follow-up examination after completed treatment for conditions other than malignant neoplasm: Secondary | ICD-10-CM | POA: Insufficient documentation

## 2020-02-01 DIAGNOSIS — K642 Third degree hemorrhoids: Secondary | ICD-10-CM | POA: Diagnosis not present

## 2020-02-01 DIAGNOSIS — F419 Anxiety disorder, unspecified: Secondary | ICD-10-CM | POA: Diagnosis not present

## 2020-02-01 DIAGNOSIS — Z8619 Personal history of other infectious and parasitic diseases: Secondary | ICD-10-CM | POA: Insufficient documentation

## 2020-02-01 DIAGNOSIS — Z86010 Personal history of colonic polyps: Secondary | ICD-10-CM | POA: Insufficient documentation

## 2020-02-01 DIAGNOSIS — Z809 Family history of malignant neoplasm, unspecified: Secondary | ICD-10-CM | POA: Diagnosis not present

## 2020-02-01 DIAGNOSIS — F17210 Nicotine dependence, cigarettes, uncomplicated: Secondary | ICD-10-CM | POA: Insufficient documentation

## 2020-02-01 DIAGNOSIS — F17200 Nicotine dependence, unspecified, uncomplicated: Secondary | ICD-10-CM | POA: Diagnosis not present

## 2020-02-01 DIAGNOSIS — K6389 Other specified diseases of intestine: Secondary | ICD-10-CM | POA: Diagnosis not present

## 2020-02-01 DIAGNOSIS — D122 Benign neoplasm of ascending colon: Secondary | ICD-10-CM

## 2020-02-01 DIAGNOSIS — Z800 Family history of malignant neoplasm of digestive organs: Secondary | ICD-10-CM | POA: Diagnosis not present

## 2020-02-01 DIAGNOSIS — Z1211 Encounter for screening for malignant neoplasm of colon: Secondary | ICD-10-CM | POA: Diagnosis present

## 2020-02-01 DIAGNOSIS — R933 Abnormal findings on diagnostic imaging of other parts of digestive tract: Secondary | ICD-10-CM

## 2020-02-01 DIAGNOSIS — G40909 Epilepsy, unspecified, not intractable, without status epilepticus: Secondary | ICD-10-CM | POA: Diagnosis not present

## 2020-02-01 HISTORY — PX: COLONOSCOPY WITH PROPOFOL: SHX5780

## 2020-02-01 HISTORY — PX: BIOPSY: SHX5522

## 2020-02-01 HISTORY — PX: POLYPECTOMY: SHX5525

## 2020-02-01 HISTORY — PX: ENDOSCOPIC MUCOSAL RESECTION: SHX6839

## 2020-02-01 HISTORY — DX: Unspecified dementia, unspecified severity, without behavioral disturbance, psychotic disturbance, mood disturbance, and anxiety: F03.90

## 2020-02-01 SURGERY — COLONOSCOPY WITH PROPOFOL
Anesthesia: Monitor Anesthesia Care

## 2020-02-01 MED ORDER — PROPOFOL 500 MG/50ML IV EMUL
INTRAVENOUS | Status: DC | PRN
  Administered 2020-02-01: 125 ug/kg/min via INTRAVENOUS

## 2020-02-01 MED ORDER — LIDOCAINE 2% (20 MG/ML) 5 ML SYRINGE
INTRAMUSCULAR | Status: DC | PRN
  Administered 2020-02-01 (×2): 50 mg via INTRAVENOUS

## 2020-02-01 MED ORDER — PROPOFOL 10 MG/ML IV BOLUS
INTRAVENOUS | Status: DC | PRN
  Administered 2020-02-01: 70 mg via INTRAVENOUS

## 2020-02-01 MED ORDER — LACTATED RINGERS IV SOLN: INTRAVENOUS | Status: DC | PRN

## 2020-02-01 MED ORDER — SODIUM CHLORIDE 0.9 % IV SOLN: INTRAVENOUS | Status: DC

## 2020-02-01 SURGICAL SUPPLY — 22 items

## 2020-02-01 NOTE — Discharge Instructions (Signed)
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
## 2020-02-01 NOTE — Anesthesia Procedure Notes (Signed)
Procedure Name: MAC Date/Time: 02/01/2020 10:50 AM Performed by: Kyung Rudd, CRNA Pre-anesthesia Checklist: Patient identified, Suction available, Emergency Drugs available and Patient being monitored Oxygen Delivery Method: Simple face mask Preoxygenation: Pre-oxygenation with 100% oxygen Induction Type: IV induction Placement Confirmation: positive ETCO2

## 2020-02-01 NOTE — Op Note (Signed)
[facility] Patient Name: Stephen Buckley Procedure Date : 02/01/2020 MRN: [id number] Attending MD: Justice Britain , MD Date of Birth: 1951/05/14 CSN: [id number] Age: 69 Admit Type: Outpatient Procedure:                Colonoscopy Indications:              Surveillance: Personal history of piecemeal removal                            of adenoma on last colonoscopy 6 months ago in                            Ascending Colon and previous Cecal polyp EMR Providers:                Justice Britain, MD, Jeanella Cara, RN,                            Baird Cancer, RN, Elspeth Cho Tech., Technician,                            Rhae Lerner, CRNA Referring MD:             Wilber Oliphant Medicines:                Monitored Anesthesia Care Complications:            No immediate complications. Estimated Blood Loss:     Estimated blood loss was minimal. Procedure:                Pre-Anesthesia Assessment:                           - Prior to the procedure, a History and Physical                            was performed, and patient medications and                            allergies were reviewed. The patient's tolerance of                            previous anesthesia was also reviewed. The risks                            and benefits of the procedure and the sedation                            options and risks were discussed with the patient.                            All questions were answered, and informed consent                            was obtained. Prior Anticoagulants: The patient has  taken no previous anticoagulant or antiplatelet                            agents except for aspirin. ASA Grade Assessment:                            III - A patient with severe systemic disease. After                            reviewing the risks and benefits, the patient was                            deemed in satisfactory condition to  undergo the                            procedure.                           After obtaining informed consent, the colonoscope                            was passed under direct vision. Throughout the                            procedure, the patient's blood pressure, pulse, and                            oxygen saturations were monitored continuously. The                            CF-HQ190L ([id number] Olympus colonoscope was                            introduced through the anus and advanced to the the                            cecum, identified by appendiceal orifice and                            ileocecal valve. The colonoscopy was performed                            without difficulty. The patient tolerated the                            procedure. The quality of the bowel preparation was                            adequate. The ileocecal valve, appendiceal orifice,                            and rectum were photographed. Scope In: 10:49:22 AM Scope Out: 11:34:51 AM Scope Withdrawal Time: 0 hours 40 minutes 11 seconds  Total Procedure Duration: 0 hours 45 minutes 29 seconds  Findings:      The digital rectal exam findings include hemorrhoids. Pertinent       negatives include no palpable rectal lesions.      Extensive amounts of semi-liquid stool was found in the entire colon,       interfering with visualization. Lavage of the area was performed using       copious amounts, resulting in clearance with adequate visualization.      A medium post mucosectomy scar was found in the ascending colon. There       was some polypoid tissue that on NBI seemed to be more granulation       rather than adenomatous. However, in setting of wanting to be complete,       preparations were made for mucosal resection. Using NBI and White-light       was done to mark the borders of the lesion I then proceeded with a       piecemeal mucosal resection or the snare site using a snare as well as a        cold avulsion. Resection and retrieval were complete. Bleeding had       subsided during procedure.      Multiple sessile polyps were found in the rectum. The polyps were       diminutive in size. These are hyperplastic polyps so polypectomy was not       attempted.      Many small and large-mouthed diverticula were found in the recto-sigmoid       colon, sigmoid colon, transverse colon and ascending colon.      Normal mucosa was found in the entire colon otherwise.      Non-bleeding non-thrombosed external and internal hemorrhoids were found       during retroflexion, during perianal exam and during digital exam. The       hemorrhoids were Grade III (internal hemorrhoids that prolapse but       require manual reduction). Impression:               - Hemorrhoids found on digital rectal exam.                           - Stool in the entire examined colon. Lavaged                            copiously with adequate visualization.                           - Post mucosectomy scar in the ascending colon.                            Some polypoid appearing tissue present though                            likely granulation from previous clips. This whole                            region was resected via mucosal piecemeal resection                            as noted above.                           -  Multiple diminutive polyps in the rectum.                            Resection not attempted as these are hyperplastic                            polyps.                           - Diverticulosis in the recto-sigmoid colon, in the                            sigmoid colon, in the transverse colon and in the                            ascending colon.                           - Normal mucosa in the entire examined colon                            otherwise.                           - Non-bleeding non-thrombosed external and internal                            hemorrhoids. Recommendation:            - The patient will be observed post-procedure,                            until all discharge criteria are met.                           - Discharge patient to home.                           - Patient has a contact number available for                            emergencies. The signs and symptoms of potential                            delayed complications were discussed with the                            patient. Return to normal activities tomorrow.                            Written discharge instructions were provided to the                            patient.                           - High fiber diet.                           -  Await pathology results.                           - Repeat colonoscopy in likely 1 year for                            surveillance based on pathology results. If                            extensive adenotous tissue is present then could                            consider an earlier follow up but suspect we can                            wait a year.                           - The findings and recommendations were discussed                            with the patient.                           - The findings and recommendations were discussed                            with the patient's family. Procedure Code(s):        --- Professional ---                           [phone], Colonoscopy, flexible; with endoscopic                            mucosal resection Diagnosis Code(s):        --- Professional ---                           K64.2, Third degree hemorrhoids                           Z98.890, Other specified postprocedural states                           K62.1, Rectal polyp                           Z09, Encounter for follow-up examination after                            completed treatment for conditions other than                            malignant neoplasm                           Z86.010, Personal history of colonic polyps  K57.30, Diverticulosis of large intestine without                            perforation or abscess without bleeding CPT copyright 2019 American Medical Association. All rights reserved. The codes documented in this report are preliminary and upon coder review may  be revised to meet current compliance requirements. Justice Britain, MD 02/01/2020 12:00:47 PM Number of Addenda: 0

## 2020-02-01 NOTE — Transfer of Care (Signed)
Immediate Anesthesia Transfer of Care Note  Patient: Stephen Buckley  Procedure(s) Performed: COLONOSCOPY WITH PROPOFOL (N/A ) ENDOSCOPIC MUCOSAL RESECTION (N/A ) POLYPECTOMY BIOPSY  Patient Location: Endoscopy Unit  Anesthesia Type:MAC  Level of Consciousness: awake, alert  and oriented  Airway & Oxygen Therapy: Patient Spontanous Breathing  Post-op Assessment: Report given to RN, Post -op Vital signs reviewed and stable and Patient moving all extremities X 4  Post vital signs: Reviewed and stable  Last Vitals:  Vitals Value Taken Time  BP 155/88 02/01/20 1146  Temp 36.7 C 02/01/20 1146  Pulse 87 02/01/20 1152  Resp 28 02/01/20 1152  SpO2 98 % 02/01/20 1152  Vitals shown include unvalidated device data.  Last Pain:  Vitals:   02/01/20 1146  TempSrc: Temporal  PainSc: 0-No pain         Complications: No complications documented.

## 2020-02-01 NOTE — Anesthesia Preprocedure Evaluation (Addendum)
Anesthesia Evaluation  Patient identified by MRN, date of birth, ID band Patient awake    Reviewed: Allergy & Precautions, NPO status , Patient's Chart, lab work & pertinent test results  History of Anesthesia Complications Negative for: history of anesthetic complications  Airway Mallampati: II  TM Distance: >3 FB Neck ROM: Full    Dental  (+) Dental Advisory Given, Edentulous Upper, Edentulous Lower   Pulmonary asthma , neg recent URI, Current Smoker and Patient abstained from smoking.,    breath sounds clear to auscultation       Cardiovascular hypertension, Pt. on medications (-) angina(-) Past MI and (-) CHF  Rhythm:Regular  - Left ventricle: The cavity size was normal. Wall thickness was  increased in a pattern of mild LVH. Systolic function was normal.  The estimated ejection fraction was in the range of 60% to 65%.  Wall motion was normal; there were no regional wall motion  abnormalities. Codominant mitral inflow - borderline diastolic  dysfunction.  - Mitral valve: Mildly thickened leaflets . There was trivial  regurgitation.  - Left atrium: The atrium was normal in size.  - Inferior vena cava: The vessel was normal in size. The  respirophasic diameter changes were in the normal range (>= 50%),  consistent with normal central venous pressure.    Neuro/Psych Seizures -,  PSYCHIATRIC DISORDERS Anxiety Depression Dementia TIACVA    GI/Hepatic GERD  Medicated,(+) Hepatitis -, C? GI bleed   Endo/Other  negative endocrine ROS  Renal/GU      Musculoskeletal   Abdominal   Peds  Hematology negative hematology ROS (+)   Anesthesia Other Findings   Reproductive/Obstetrics                           Anesthesia Physical Anesthesia Plan  ASA: III  Anesthesia Plan: MAC   Post-op Pain Management:    Induction: Intravenous  PONV Risk Score and Plan: 0 and Treatment may  vary due to age or medical condition and Propofol infusion  Airway Management Planned: Nasal Cannula  Additional Equipment: None  Intra-op Plan:   Post-operative Plan:   Informed Consent: I have reviewed the patients History and Physical, chart, labs and discussed the procedure including the risks, benefits and alternatives for the proposed anesthesia with the patient or authorized representative who has indicated his/her understanding and acceptance.     Dental advisory given  Plan Discussed with: CRNA and Surgeon  Anesthesia Plan Comments:         Anesthesia Quick Evaluation

## 2020-02-02 ENCOUNTER — Encounter (HOSPITAL_COMMUNITY): Payer: Self-pay | Admitting: Gastroenterology

## 2020-02-02 LAB — SURGICAL PATHOLOGY

## 2020-02-06 NOTE — Anesthesia Postprocedure Evaluation (Signed)
Anesthesia Post Note  Patient: Stephen Buckley  Procedure(s) Performed: COLONOSCOPY WITH PROPOFOL (N/A ) ENDOSCOPIC MUCOSAL RESECTION (N/A ) POLYPECTOMY BIOPSY     Patient location during evaluation: Endoscopy Anesthesia Type: MAC Level of consciousness: awake and alert Pain management: pain level controlled Vital Signs Assessment: post-procedure vital signs reviewed and stable Respiratory status: spontaneous breathing, nonlabored ventilation, respiratory function stable and patient connected to nasal cannula oxygen Cardiovascular status: stable and blood pressure returned to baseline Postop Assessment: no apparent nausea or vomiting Anesthetic complications: no   No complications documented.  Last Vitals:  Vitals:   02/01/20 1156 02/01/20 1206  BP: (!) 167/89 (!) 157/97  Pulse: 81 79  Resp: 16 19  Temp:    SpO2: 98% 96%    Last Pain:  Vitals:   02/02/20 1220  TempSrc:   PainSc: 0-No pain                 Ekaterina Denise

## 2020-02-11 ENCOUNTER — Telehealth: Payer: Self-pay

## 2020-02-11 NOTE — Telephone Encounter (Signed)
Patient calls nurse line regarding productive cough. Patient reports sputum is thick and yellow to white in color. Patient denies fever, chest pain or shortness of breath. Endorses body aches and nasal congestion. Patient has had both COVID vaccines and denies sick contacts. Due to symptoms advised patient to receive COVID testing. Patient advised to return call to office with results. Instructed patient on supportive measures. Return precautions given.   No available appointments until Monday. After receiving COVID test results patient will call back to determine if he would like to schedule appointment.    ED precautions given  To PCP  Talbot Grumbling, RN

## 2020-02-14 ENCOUNTER — Encounter: Payer: Self-pay | Admitting: Gastroenterology

## 2020-03-09 ENCOUNTER — Telehealth (INDEPENDENT_AMBULATORY_CARE_PROVIDER_SITE_OTHER): Payer: Medicare Other | Admitting: Psychiatry

## 2020-03-09 ENCOUNTER — Encounter (HOSPITAL_COMMUNITY): Payer: Self-pay | Admitting: Psychiatry

## 2020-03-09 ENCOUNTER — Other Ambulatory Visit: Payer: Self-pay

## 2020-03-09 VITALS — Wt 230.0 lb

## 2020-03-09 DIAGNOSIS — F09 Unspecified mental disorder due to known physiological condition: Secondary | ICD-10-CM | POA: Diagnosis not present

## 2020-03-09 DIAGNOSIS — F319 Bipolar disorder, unspecified: Secondary | ICD-10-CM

## 2020-03-09 MED ORDER — CARBAMAZEPINE 200 MG PO TABS: ORAL_TABLET | ORAL | 2 refills | Status: DC

## 2020-03-09 MED ORDER — QUETIAPINE FUMARATE 200 MG PO TABS: 200.0000 mg | ORAL_TABLET | Freq: Every day | ORAL | 2 refills | Status: DC
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
## 2020-03-11 ENCOUNTER — Ambulatory Visit (INDEPENDENT_AMBULATORY_CARE_PROVIDER_SITE_OTHER): Payer: Medicare Other | Admitting: Family Medicine

## 2020-03-11 ENCOUNTER — Other Ambulatory Visit: Payer: Self-pay

## 2020-03-11 ENCOUNTER — Encounter: Payer: Self-pay | Admitting: Family Medicine

## 2020-03-11 VITALS — BP 148/74 | HR 86 | Ht 69.0 in | Wt 207.0 lb

## 2020-03-11 DIAGNOSIS — N399 Disorder of urinary system, unspecified: Secondary | ICD-10-CM

## 2020-03-11 DIAGNOSIS — Z23 Encounter for immunization: Secondary | ICD-10-CM

## 2020-03-11 DIAGNOSIS — Z720 Tobacco use: Secondary | ICD-10-CM

## 2020-03-11 DIAGNOSIS — G40909 Epilepsy, unspecified, not intractable, without status epilepticus: Secondary | ICD-10-CM

## 2020-03-11 DIAGNOSIS — H5712 Ocular pain, left eye: Secondary | ICD-10-CM

## 2020-03-11 LAB — POCT URINALYSIS DIP (MANUAL ENTRY)
Bilirubin, UA: NEGATIVE
Blood, UA: NEGATIVE
Glucose, UA: NEGATIVE mg/dL
Ketones, POC UA: NEGATIVE mg/dL
Leukocytes, UA: NEGATIVE
Nitrite, UA: NEGATIVE
Protein Ur, POC: NEGATIVE mg/dL
Spec Grav, UA: 1.025 (ref 1.010–1.025)
Urobilinogen, UA: 0.2 E.U./dL
pH, UA: 5.5 (ref 5.0–8.0)

## 2020-03-11 NOTE — Progress Notes (Addendum)
° ° °  SUBJECTIVE:  CHIEF COMPLAINT / HPI:   Left eye bothering him Told there was something in his eye in the past. Feels like there is something in his eye every once in a while. Previously told that he had a "bad scratch" to the eye since 2013. Denies pain or changes in vision. He reports sensation of foreign object. No redness or itching when discomfort does occur  Issues with urination  Patient having accidents and wearing diapers and has been wearing them for a while now. Sometimes it is hard to get to the bathroom soon enough. Reports increased urination with drinking water or when even coming in contact with water or hearing water.  Denies pain with urination.   PERTINENT  PMH / PSH: TBI, GERD, memory difficulty, HTN, obesity, tobacco use   OBJECTIVE:  BP (!) 148/74    Pulse 86    Ht 5\' 9"  (1.753 m)    Wt 207 lb (93.9 kg)    SpO2 97%    BMI 30.57 kg/m   General: well appearing male, NAD  HEENT: PERRLA, EOMI, no injection or erythema of sclera or conjunctiva bilaterally  No TTP of pelvic area.   ASSESSMENT/PLAN:  Tobacco use Discussed tobacco cessation with patient. He is very interested and has been wanting to quit for a long time. Recommended that patient follow up ASAP for further management.   Urinary disorder Patient is a poor historian and we have discussed his issues with urination in the past. It seems that he has issues with incontinence and retention. Today, complaining mostly of incontinence. UA negative. Given multiple attempts of treatment, will send patient to urology for further management and evaluation.   Seizure disorder [facility]) Received message from Dr. Adele Schilder requesting rechecking tegretol levels today. Order placed.   Pain of left eye On and off eye since 2013. Overall feels discomfort and foreign body sensation. Physical exam is benign. Did not look for corneal abrasion as patient has no complaints. Given chronicity of issue, patient would benefit from formal  ophthalmology exam. Will send referral.     Stephen Oliphant, MD [place]

## 2020-03-11 NOTE — Patient Instructions (Addendum)
I have sent a referral to urology and to ophthalmology for the issues that you are having today.   Come back to discuss your high blood pressure at your earliest convenience.

## 2020-03-12 LAB — CARBAMAZEPINE LEVEL, TOTAL: Carbamazepine (Tegretol), S: 7.1 ug/mL (ref 4.0–12.0)

## 2020-03-16 DIAGNOSIS — H5712 Ocular pain, left eye: Secondary | ICD-10-CM | POA: Insufficient documentation

## 2020-03-16 NOTE — Assessment & Plan Note (Signed)
Patient is a poor historian and we have discussed his issues with urination in the past. It seems that he has issues with incontinence and retention. Today, complaining mostly of incontinence. UA negative. Given multiple attempts of treatment, will send patient to urology for further management and evaluation.

## 2020-03-16 NOTE — Assessment & Plan Note (Signed)
Received message from Dr. Adele Schilder requesting rechecking tegretol levels today. Order placed.

## 2020-03-16 NOTE — Assessment & Plan Note (Signed)
Discussed tobacco cessation with patient. He is very interested and has been wanting to quit for a long time. Recommended that patient follow up ASAP for further management.

## 2020-03-16 NOTE — Assessment & Plan Note (Addendum)
On and off eye since 2013. Overall feels discomfort and foreign body sensation. Physical exam is benign. Did not look for corneal abrasion as patient has no complaints. Given chronicity of issue, patient would benefit from formal ophthalmology exam. Will send referral.

## 2020-03-28 ENCOUNTER — Other Ambulatory Visit: Payer: Self-pay | Admitting: Family Medicine

## 2020-04-06 ENCOUNTER — Other Ambulatory Visit: Payer: Self-pay | Admitting: Family Medicine

## 2020-04-06 DIAGNOSIS — J302 Other seasonal allergic rhinitis: Secondary | ICD-10-CM

## 2020-04-10 ENCOUNTER — Telehealth: Payer: Self-pay | Admitting: Family Medicine

## 2020-04-10 NOTE — Telephone Encounter (Signed)
**  After Hours/ Emergency Line Call**  Received a call from Elspeth Cho . Patient was hoping to talk to his pcp, Dr. Maudie Mercury. He would like a referral to a dentist because he has a red spot on his gums where his dentures are. It is not purulent.  It is tender to palpation he states.  He does not feel like he has a fever.  I told the patient referrals aren't necessary for dentists, but that I would forward his message to his PCP.    Clemetine Marker, MD PGY-3, [facility] 04/10/2020 6:55 PM

## 2020-04-25 ENCOUNTER — Encounter: Payer: Self-pay | Admitting: Neurology

## 2020-04-25 ENCOUNTER — Ambulatory Visit: Payer: Medicare Other | Admitting: Neurology

## 2020-04-25 ENCOUNTER — Other Ambulatory Visit: Payer: Self-pay

## 2020-04-25 VITALS — BP 128/82 | HR 74 | Ht 69.0 in | Wt 208.8 lb

## 2020-04-25 DIAGNOSIS — R413 Other amnesia: Secondary | ICD-10-CM

## 2020-04-25 DIAGNOSIS — S069X9D Unspecified intracranial injury with loss of consciousness of unspecified duration, subsequent encounter: Secondary | ICD-10-CM

## 2020-04-25 DIAGNOSIS — G40909 Epilepsy, unspecified, not intractable, without status epilepticus: Secondary | ICD-10-CM | POA: Diagnosis not present

## 2020-04-25 MED ORDER — MEMANTINE HCL 10 MG PO TABS: 10.0000 mg | ORAL_TABLET | Freq: Two times a day (BID) | ORAL | 3 refills | Status: DC

## 2020-04-25 NOTE — Patient Instructions (Signed)
Continue current medications Check blood work today Will check on neuro-psych referral  See you back in 1 year
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
## 2020-04-26 LAB — COMPREHENSIVE METABOLIC PANEL
ALT: 18 IU/L (ref 0–44)
AST: 17 IU/L (ref 0–40)
Albumin/Globulin Ratio: 1.8 (ref 1.2–2.2)
Albumin: 4.5 g/dL (ref 3.8–4.8)
Alkaline Phosphatase: 88 IU/L (ref 44–121)
BUN/Creatinine Ratio: 6 — ABNORMAL LOW (ref 10–24)
BUN: 6 mg/dL — ABNORMAL LOW (ref 8–27)
Bilirubin Total: 0.3 mg/dL (ref 0.0–1.2)
CO2: 21 mmol/L (ref 20–29)
Calcium: 9.2 mg/dL (ref 8.6–10.2)
Chloride: 108 mmol/L — ABNORMAL HIGH (ref 96–106)
Creatinine, Ser: 0.94 mg/dL (ref 0.76–1.27)
GFR calc Af Amer: 97 mL/min/{1.73_m2} (ref >59)
GFR calc non Af Amer: 84 mL/min/{1.73_m2} (ref >59)
Globulin, Total: 2.5 g/dL (ref 1.5–4.5)
Glucose: 96 mg/dL (ref 65–99)
Potassium: 4.3 mmol/L (ref 3.5–5.2)
Sodium: 144 mmol/L (ref 134–144)
Total Protein: 7 g/dL (ref 6.0–8.5)

## 2020-04-26 LAB — CBC WITH DIFFERENTIAL/PLATELET
Basophils Absolute: 0 10*3/uL (ref 0.0–0.2)
Basos: 0 %
EOS (ABSOLUTE): 0.1 10*3/uL (ref 0.0–0.4)
Eos: 1 %
Hematocrit: 42.2 % (ref 37.5–51.0)
Hemoglobin: 14.5 g/dL (ref 13.0–17.7)
Immature Grans (Abs): 0 10*3/uL (ref 0.0–0.1)
Immature Granulocytes: 0 %
Lymphocytes Absolute: 1.4 10*3/uL (ref 0.7–3.1)
Lymphs: 37 %
MCH: 30.9 pg (ref 26.6–33.0)
MCHC: 34.4 g/dL (ref 31.5–35.7)
MCV: 90 fL (ref 79–97)
Monocytes Absolute: 0.5 10*3/uL (ref 0.1–0.9)
Monocytes: 13 %
Neutrophils Absolute: 1.9 10*3/uL (ref 1.4–7.0)
Neutrophils: 49 %
Platelets: 170 10*3/uL (ref 150–450)
RBC: 4.7 x10E6/uL (ref 4.14–5.80)
RDW: 13 % (ref 11.6–15.4)
WBC: 3.9 10*3/uL (ref 3.4–10.8)

## 2020-04-26 NOTE — Progress Notes (Signed)
I have read the note, and I agree with the clinical assessment and plan.  Rayvion K Rees Matura   

## 2020-04-27 ENCOUNTER — Other Ambulatory Visit: Payer: Self-pay | Admitting: Neurology

## 2020-04-27 ENCOUNTER — Encounter: Payer: Self-pay | Admitting: Counselor

## 2020-05-26 ENCOUNTER — Other Ambulatory Visit: Payer: Self-pay | Admitting: Family Medicine

## 2020-05-26 DIAGNOSIS — E785 Hyperlipidemia, unspecified: Secondary | ICD-10-CM

## 2020-06-02 ENCOUNTER — Encounter: Payer: Self-pay | Admitting: Neurology

## 2020-06-02 NOTE — Telephone Encounter (Signed)
I called wife of pt.  She wanted to let SS/NP know that her niece will be living with pt, the wife will still take pt to appt and deal with medications.  Has appt with neuro psych 06-10-20. We will see for worsening memory issues as needed.  She appreciated call back but wanted to let SS/NP know.

## 2020-06-07 ENCOUNTER — Encounter (HOSPITAL_COMMUNITY): Payer: Self-pay | Admitting: Psychiatry

## 2020-06-07 ENCOUNTER — Other Ambulatory Visit: Payer: Self-pay

## 2020-06-07 ENCOUNTER — Telehealth (INDEPENDENT_AMBULATORY_CARE_PROVIDER_SITE_OTHER): Payer: Medicare Other | Admitting: Psychiatry

## 2020-06-07 DIAGNOSIS — F319 Bipolar disorder, unspecified: Secondary | ICD-10-CM

## 2020-06-07 DIAGNOSIS — F09 Unspecified mental disorder due to known physiological condition: Secondary | ICD-10-CM | POA: Diagnosis not present

## 2020-06-07 MED ORDER — CARBAMAZEPINE 200 MG PO TABS: ORAL_TABLET | ORAL | 2 refills | Status: DC

## 2020-06-07 MED ORDER — QUETIAPINE FUMARATE 200 MG PO TABS: 200.0000 mg | ORAL_TABLET | Freq: Every day | ORAL | 2 refills | Status: DC
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
## 2020-06-10 ENCOUNTER — Encounter: Payer: Self-pay | Admitting: Counselor

## 2020-06-10 ENCOUNTER — Ambulatory Visit: Payer: Medicare Other

## 2020-06-10 ENCOUNTER — Other Ambulatory Visit: Payer: Self-pay

## 2020-06-10 ENCOUNTER — Ambulatory Visit (INDEPENDENT_AMBULATORY_CARE_PROVIDER_SITE_OTHER): Payer: Medicare Other | Admitting: Counselor

## 2020-06-10 DIAGNOSIS — S069X9S Unspecified intracranial injury with loss of consciousness of unspecified duration, sequela: Secondary | ICD-10-CM | POA: Diagnosis not present

## 2020-06-10 DIAGNOSIS — F09 Unspecified mental disorder due to known physiological condition: Secondary | ICD-10-CM

## 2020-06-10 DIAGNOSIS — F0281 Dementia in other diseases classified elsewhere with behavioral disturbance: Secondary | ICD-10-CM | POA: Diagnosis not present

## 2020-06-10 DIAGNOSIS — F02818 Dementia in other diseases classified elsewhere, unspecified severity, with other behavioral disturbance: Secondary | ICD-10-CM
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
## 2020-06-10 NOTE — Progress Notes (Signed)
[facility]  Patient Name: Stephen Buckley MRN: [id number] Date of Birth: [birth year] Age: 69 y.o. Education: 12 years  Measurement properties of test scores: IQ, Index, and Standard Scores (SS): Mean = 100; Standard Deviation = 15 Scaled Scores (Ss): Mean = 10; Standard Deviation = 3 Z scores (Z): Mean = 0; Standard Deviation = 1 T scores (T); Mean = 50; Standard Deviation = 10  TEST SCORES:    Note: This summary of test scores accompanies the interpretive report and should not be interpreted by unqualified individuals or in isolation without reference to the report. Test scores are relative to age, gender, and educational history as available and appropriate.   Performance Validity        ACS: Raw Descriptor      Word Choice: 49 Within Expectation      The Dot Counting Test: Raw Descriptor      E-Score 10 Within Expectation      Embedded Measures: Raw Descriptor      RBANS Effort Index: 2 Within Expectation      WAIS-IV Reliable Digit Span 10 Within Expectation      WAIS-IV Reliable Digit Span Revised 14 Within Expectation      Expected Functioning        Wide Range Achievement Test (Word Reading): Standard/Scaled Score Percentile       Word Reading 102 55      Reynolds Intellectual Screening Test Standard/T-score Percentile      Guess What 33 5      Odd Item Out 52 58  RIST Index 90 25      Cognitive Testing        RBANS, Form : Standard/Scaled Score Percentile  Total Score 73 4  Immediate Memory 49 <1      List Learning 2 <1      Story Memory 2 <1  Visuospatial/Constructional 112 79      Figure Copy   (19) 11 63      Judgment of Line Orientation   (19) --- >75  Language 78 7      Picture Naming --- 51-75      Semantic Fluency 2 <1  Attention 91 27      Digit Span 10 50      Coding 7 16  Delayed Memory 64 1      List Recall   (0) --- <2      List Recognition   (15) --- <2      Story Recall   (0) 1 <1       Figure Recall   (15) 11 63      Wechsler Adult Intelligence Scale - IV: Standard/Scaled Score Percentile  Working Memory Index 95 37      Digit Span 10 50          Digit Span Forward 11 63          Digit Span Backward 11 63          Digit Span Sequencing 8 25      Arithmetic 8 25  Processing Speed Index 81 10      Symbol Search 6 9      Coding 7 16      Neuropsychological Assessment Battery (Language Module): T-score Percentile      Naming   (29) 45 31      Verbal Fluency: T-score Percentile      Controlled Oral Word Association (F-A-S) 38 12  Semantic Fluency (Animals) 39 14      Trail Making Test: T-Score Percentile      Part A 43 25      Part B 53 62      Modified [place] Card Sorting Test (MWCST): Standard/T-Score Percentile      Number of Categories Correct 19 <1      Number of Perseverative Errors 20 <1      Number of Total Errors 22 <1      Percent Perseverative Errors 28 2  Executive Function Composite 54 <1      Boston Diagnostic Aphasia Exam: Raw Score Scaled Score      Complex Ideational Material 9 5      Clock Drawing Raw Score Descriptor      Command 10 WNL      Rating Scales         Raw Score Descriptor  Patient Health Questionnaire - 9 7 Mild  GAD-7 2 Within Normal Limits      Functional Activities Questionnaire Raw Score Descriptor  Total Score 17 Major Neurocognitive   Sevyn Paredez V. Nicole Kindred PsyD, [place] Clinical Neuropsychologist

## 2020-06-10 NOTE — Progress Notes (Signed)
   Psychometrist Note   Cognitive testing was administered to Stephen Buckley by Lamar Benes, B.S. (Technician) under the supervision of Alphonzo Severance, Psy.D., ABN. Stephen Buckley was able to tolerate all test procedures. Dr. Nicole Kindred met with the patient as needed to manage any emotional reactions to the testing procedures. Rest breaks were offered.    The battery of tests administered was selected by Dr. Nicole Kindred with consideration to the patient's current level of functioning, the nature of his symptoms, emotional and behavioral responses during the interview, level of literacy, observed level of motivation/effort, and the nature of the referral question. This battery was communicated to the psychometrist. Communication between Dr. Nicole Kindred and the psychometrist was ongoing throughout the evaluation and Dr. Nicole Kindred was immediately accessible at all times. Dr. Nicole Kindred provided supervision to the technician on the date of this service, to the extent necessary to assure the quality of all services provided.    Stephen Buckley will return in approximately one week for an interactive feedback session with Dr. Nicole Kindred, at which time test performance, clinical impressions, and treatment recommendations will be reviewed in detail. The patient understands he can contact our office should he require our assistance before this time.   A total of 130 minutes of billable time were spent with Stephen Buckley by the technician, including test administration and scoring time. Billing for these services is reflected in Dr. Les Pou note.   This note reflects time spent with the psychometrician and does not include test scores, clinical history, or any interpretations made by Dr. Nicole Kindred. The full report will follow in a separate note.

## 2020-06-13 NOTE — Progress Notes (Signed)
[facility]  Patient Name: Stephen Buckley MRN: [id number] Date of Birth: 06-13-1951 Age: 69 y.o. 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 His wife has been concerned about worsening memory function for many years, resulting in a neuropsychological evaluation in 2019 with Dr. Si Raider. She felt that his diminished word retrieval, verbal memory problems, and processing speed impairment were consistent with major neurocognitive disorder, most likely due to his head injury but the possibility of underlying Alzheimer's disease could not be ruled out. They are complaining of the same issues within the present evaluation, with worsening of his memory problems, although he is doing much better behaviorally. Of note, he is now taking his medications as prescribed, which was an issue of contention for many years. His mental status screening has been stable over the interval from 2019 to 2021.   Similar to the previous evaluation, neuropsychological test findings show impairment in verbal memory abilities, some executive dysfunction, and weak scores on measures of processing speed. On the other hand, Stephen Buckley retained visual information very well and there is actually suggestion of improvement in some areas such as with visual object confrontation naming, animal fluency, and on one particularly challenging measure of cognitive flexibility involving alternating sequencing of numbers and letters of the alphabet. Naming and semantic fluency are viewed as most specific for Alzheimer's type clinical presentations and it is thus reassuring that they are in fact better than at the previous testing. His wife rated him as functioning at an impaired level, day-to-day, requiring  assistance in most areas and dependent in some. He screened in the mild range for depressive symptoms.   While Stephen Buckley's presentation remains consistent with a major neurocognitive disorder level problem, my suspicion is that it is due to his history of head injury and I do not see compelling evidence of a clinically identifiable separate neurodegenerative cause at this time. From a testing perspective, there is no evidence of decline in any area as compared to his previous testing and he is in fact doing better on many of the measures viewed as most specific for Alzheimer's type clinical problems. This could represent practice effects or better control of his behavioral/mental health conditions. Would recommend that his wife continue to provide him with assistance as needed, with his medications, finances, and other matters. He is doing well behaviorally on his current medication regimen. He may benefit from a memory notebook and increasing activities, because his fixation on appointments makes me question if he has a dearth of other things that hold his interest day-to-day. Follow up evaluation can be considered as needed.   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 Reassuringly, however, there is no decline in your overall ability and you actually seemed to do better on several measures, including some measures that are viewed as most relevant to the question of possible Alzheimer's disease. 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   In your case, I think that your  traumatic brain injury is the biggest cause of day-to-day problems. Memory and thinking abilities are fluid constructs and your perception of worsening may be due to any number of factors. There is some evidence that brain injuries make individuals more susceptible to the typical age-related cognitive changes that occur in all individuals, and this is one potential factor. Some of the medications that Lannie is on also may be causing cognitive interference and he is taking them consistently now, which could contribute to some perception of worsening.   I would recommend that you engage in more cognitively stimulating and enjoyable activities. Your fixation on and concern about medical appointments may be related to not getting enough day-to-day stimulation in your life, and therefore having more things that hold your interest might be helpful in decreasing that behavior.   I would suggest that you use a memory notebook, planner, and other external aids as needed. This would be a place where you can keep track of your appointments and other things that you need to remember.   Day to day problems with executive functioning may be manifested as: Marland Kitchen Difficulty planning a project. . Trouble estimating how much time a project may take. . Trouble communicating details in an organized sequential manner.  . Difficulty with mental strategies involved in memorization and retrieving information from memory.  . Problems with error correction or troubleshooting or shifting problem-solving approach, including in interpersonal situations.  . Difficulty in situations where responses are not well-learned, including social and interpersonal problem-solving situations.  . Problems with affect and social judgment. . Situations requiring overcoming strong emotions, overcoming a strong habitual response, or resisting temptation.  Consider the following strategies as  potentially helpful for dealing with executive difficulties:  . Set up the environment to support desired behavior. This may include removing distracting stimuli at times when focus is required, laying out items needed to complete a task so that they are immediately available and they do not need to be found, and moving to a different setting if frustration or agitation occurs to refocus on the task at hand.  . The use of external reminders such as alarms, sticky notes, and signs that may help support behavioral success.  Marland Kitchen Help with planning, problem solving, or other complex things by breaking things down into a series of concrete steps that can be communicated in written form.  . Be attentive to behavioral clues that may signal impending agitation, such as physiologic arousal and irritability.  Marland Kitchen Help the individual to identify and express their feelings.  . Consider things in the environment as contributing to behavioral issues, including unmet needs such as hunger, thirst, the need to toilet, or illness.    Test Findings  Test scores are summarized in additional documentation associated with this encounter. Test scores are relative to age, gender, and educational history as available and appropriate. There were no concerns about performance validity as all findings fell within normal expectations.   General Intellectual Functioning/Achievement:  Performance on single word reading was average. This contrasts with an unusually low score on the verbally mediated subtest of the RIST. Performance on the more visually oriented subtest was average and the overall RIST index fell toward the lower part of the average range. The RIST index was used as a comparison for Stephen Buckley's cognitive test performance.   Attention and Processing Efficiency: Indicators of attention and working memory were similar to the previous evaluation and fell at a reasonable level with an overall average score on  the  Working Memory Index from the [place]. Adequate average range scores were obtained on indicators of digit repetition forward, backward, and digit resequencing in ascending order. Mental solving of arithmetical word problems without paper and pencil was also average.   Performance on measures of processing speed was similar to the previous evaluation, falling at a low average level overall on the Processing Speed Index of the WAIS-IV. Performance was low average on two different timed number-symbol coding procedures. Efficient visual matching and efficient visual scanning was unusually low on the Symbol Search subtest of the WAIS-IV.   Language: Performance on indicators of language function was a bit better than at the previous testing with average as compared to extremely low visual object confrontation naming. Semantic fluency was also better, falling at a low average level. Phonemic fluency was also low average, similar to the previous testing.   Visuospatial Function: Performance on visuospatial and constructional indicators represented an area of relative strength for Stephen Buckley with a high average score on the overall index. Good average to high average findings were demonstrated on constructional and perceptual indicators.   Learning and Memory: Learning and memory measures showed significant impairment in acquisition and retention of verbal information, similar to the previous assessment. Of note, he had no difficulties recalling visual information and there is no decline relative to the previous testing.   In the verbal realm, immediate recall was extremely low for a 10-item word list and short story. This was followed by extremely low long delayed recall (did not remember any of the information). Recognition discriminability for words from the word list versus false choices was extremely low.   In the visual realm, delayed recall of a modestly complex figure stimulus was average.    Executive Functions: Performance on executive indicators was mixed, with suggestion of somewhat more robust performance than at the previous evaluation but there is still evidence of some executive dysfunction. Alternating sequencing of numbers and letters of the alphabet was average, which is much better than his previous performance, because he was "unable" to even complete the task. Performance on the Modified [employer] was extremely low at an index level with comparable extremely low scores for categories completed and perseverative errors. He also had difficulty reasoning with complex verbal information with an unusually low score on the Complex Ideational Material. Generation of words in response to the letters F-A-S was low average. Clock drawing was within normal limits with good face formation, numbering, and correct hand placement with size difference respected.   Rating Scale(s): Stephen Buckley scored in the mild range for depressive symptoms, indicating that he has little interest or pleasure in doing things more than half the days over the past two weeks. His level of anxiety symptoms was minimal. He was characterized as having substantial functional difficulties by his wife, requiring assistance in most domains and dependent in some.   Viviano Simas Nicole Kindred PsyD, [place] Clinical Neuropsychologist

## 2020-06-16 ENCOUNTER — Encounter: Payer: Self-pay | Admitting: Counselor

## 2020-06-16 ENCOUNTER — Ambulatory Visit (INDEPENDENT_AMBULATORY_CARE_PROVIDER_SITE_OTHER): Payer: Medicare Other | Admitting: Counselor

## 2020-06-16 ENCOUNTER — Other Ambulatory Visit: Payer: Self-pay

## 2020-06-16 DIAGNOSIS — S069X9D Unspecified intracranial injury with loss of consciousness of unspecified duration, subsequent encounter: Secondary | ICD-10-CM

## 2020-06-16 DIAGNOSIS — IMO0001 Reserved for inherently not codable concepts without codable children: Secondary | ICD-10-CM

## 2020-06-16 DIAGNOSIS — F0281 Dementia in other diseases classified elsewhere with behavioral disturbance: Secondary | ICD-10-CM

## 2020-06-16 NOTE — Progress Notes (Signed)
[facility]  Feedback Note: I met with Stephen Buckley to review the findings resulting from his neuropsychological evaluation. Since the last appointment, he has been about the same. Time was spent reviewing the impressions and recommendations that are detailed in the evaluation report. We discussed my impression that at least from a neuropsychological perspective, there are no findings to suggest he is developing AD. He is if anything looking better than the last time he was evaluated. Also discussed importance of engaging in more activities. Other interventions as reflected in the patient instructions. I took time to explain the findings and answer all the patient's questions. I encouraged Stephen Buckley to contact me should he have any further questions or if further follow up is desired.   Current Medications and Medical History   Current Outpatient Medications  Medication Sig Dispense Refill  . atorvastatin (LIPITOR) 40 MG tablet Take 1 tablet by mouth once daily 90 tablet 0  . finasteride (PROSCAR) 5 MG tablet Take 1 tablet by mouth once daily 90 tablet 0  . aspirin EC 325 MG EC tablet Take 1 tablet (325 mg total) by mouth daily. 30 tablet 0  . Blood Pressure Monitoring (BLOOD PRESSURE MONITOR AUTOMAT) DEVI 1 kit by Does not apply route daily. Please call the office if you have any issues obtaining this monitor. 1 each 0  . carbamazepine (TEGRETOL) 200 MG tablet TAKE 1 TABLET BY MOUTH IN THE MORNING AND TAKE 1 & 1/2 (ONE & ONE-HALF) TABLET BY MOUTH AT BEDTIME 75 tablet 2  . EQ ALLERGY RELIEF, CETIRIZINE, 10 MG tablet Take 1 tablet by mouth once daily 90 tablet 0  . fluticasone (FLONASE) 50 MCG/ACT nasal spray Use 2 spray(s) in each nostril once daily (Patient taking differently: Place 2 sprays into both nostrils daily. ) 16 g 12  . LORazepam (ATIVAN) 0.5 MG tablet Take 1 tablet by mouth twice daily as needed 60 tablet 1  . memantine (NAMENDA) 10  MG tablet Take 1 tablet (10 mg total) by mouth 2 (two) times daily. 180 tablet 3  . nicotine (NICODERM CQ - DOSED IN MG/24 HOURS) 21 mg/24hr patch Place 1 patch (21 mg total) onto the skin daily. 28 patch 1  . Ophthalmic Irrigation Solution (EYE WASH) SOLN Place 1 drop into both eyes daily.    . pantoprazole (PROTONIX) 40 MG tablet Take 1 tablet by mouth once daily 90 tablet 0  . QUEtiapine (SEROQUEL) 200 MG tablet Take 1 tablet (200 mg total) by mouth at bedtime. 30 tablet 2  . tamsulosin (FLOMAX) 0.4 MG CAPS capsule Take 1 capsule by mouth once daily (Patient taking differently: Take 0.4 mg by mouth daily. ) 90 capsule 3  . umeclidinium bromide (INCRUSE ELLIPTA) 62.5 MCG/INH AEPB Inhale 1 puff into the lungs daily. 30 each 11   No current facility-administered medications for this visit.    Patient Active Problem List   Diagnosis Date Noted  . Pain of left eye 03/16/2020  . Urinary disorder 06/05/2019  . Adenomatous polyp of ascending colon 04/02/2019  . History of colonic polyps 04/02/2019  . Abnormal colonoscopy 04/02/2019  . Gastroesophageal reflux disease 04/02/2019  . History of hepatitis C virus infection 04/02/2019  . BPH (benign prostatic hyperplasia) 08/21/2018  . 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 CVA (cerebral vascular accident) ([place]) 09/03/2017  . Seizure disorder ([place]) 11/29/2016  . Memory difficulty 11/01/2016  . Essential hypertension 08/15/2015  . Dyslipidemia 08/15/2015  .  Smokers' cough ([place]) 03/16/2015  . Obesity 08/28/2013  . Tobacco use 08/07/2013  . 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 TBI (traumatic brain injury) ([place]) 03/04/2012    Mental Status and Behavioral Observations  Stephen Buckley presented on time to the present encounter and was alert and generally oriented. Speech was normal in rate, rhythm, volume, and prosody. Self-reported mood was "good" and affect was neutral. Thought process was somewhat  tangential, he would often raise things that were out of sync with the conversation and thought content was appropriate to the topics discussed. There were no safety concerns identified at today's encounter, such as thoughts of harming self or others.   Plan  Feedback provided regarding the patient's neuropsychological evaluation. He also had questions regarding return to driving. I shared with him my impression that it is not likely a good idea, although he could further pursue clarity via on the road driving test if he does not wish to accept that. I also encouraged him to bring that up with his ongoing care providers, because it was not the reason for which he was referred to my clinic. Stephen Buckley was encouraged to contact me if any questions arise or if further follow up is desired.   Viviano Simas Nicole Kindred, PsyD, ABN Clinical Neuropsychologist  Service(s) Provided at This Encounter: 31 minutes [phone]; Conjoint therapy with patient present)

## 2020-06-16 NOTE — Patient Instructions (Signed)
Your performance and presentation on neuropsychological assessment continues to show difficulties with verbal memory functioning and some other areas including executive abilities (ie., problem solving, learning from environmental feedback, etc.) and perhaps also processing speed. Reassuringly, however, there is no decline in your overall ability and you actually seemed to do better on several measures, including some measures that are viewed as most relevant to the question of possible Alzheimer's disease. 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   In your case, I think that your traumatic brain injury is the biggest cause of day-to-day problems. Memory and thinking abilities are fluid constructs and your perception of worsening may be due to any number of factors. There is some evidence that brain injuries make individuals more susceptible to the typical age-related cognitive changes that occur in all individuals, and this is one potential factor. Some of the medications that Stephen Buckley is on also may be causing cognitive interference and he is taking them consistently now, which could contribute to some perception of worsening.   I would recommend that you engage in more cognitively stimulating and enjoyable activities. Your fixation on and concern about medical appointments may be related to not getting enough day-to-day stimulation in your life, and therefore having more things that hold your interest might be helpful in decreasing that behavior.   I would suggest that you use a memory notebook, planner, and other external aids as needed. This would be a place where you can keep track of your appointments and other things that you need to remember.   Day to day problems with executive functioning may be manifested as:  Difficulty planning a project.  Trouble estimating how much time a project may take.  Trouble communicating details  in an organized sequential manner.   Difficulty with mental strategies involved in memorization and retrieving information from memory.   Problems with error correction or troubleshooting or shifting problem-solving approach, including in interpersonal situations.   Difficulty in situations where responses are not well-learned, including social and interpersonal problem-solving situations.   Problems with affect and social judgment.  Situations requiring overcoming strong emotions, overcoming a strong habitual response, or resisting temptation.  Consider the following strategies as potentially helpful for dealing with executive difficulties:   Set up the environment to support desired behavior. This may include removing distracting stimuli at times when focus is required, laying out items needed to complete a task so that they are immediately available and they do not need to be found, and moving to a different setting if frustration or agitation occurs to refocus on the task at hand.   The use of external reminders such as alarms, sticky notes, and signs that may help support behavioral success.   Help with planning, problem solving, or other complex things by breaking things down into a series of concrete steps that can be communicated in written form.   Be attentive to behavioral clues that may signal impending agitation, such as physiologic arousal and irritability.   Help the individual to identify and express their feelings.   Consider things in the environment as contributing to behavioral issues, including unmet needs such as hunger, thirst, the need to toilet, or illness.

## 2020-06-20 ENCOUNTER — Encounter: Payer: Medicare Other | Admitting: Counselor

## 2020-06-20 ENCOUNTER — Telehealth: Payer: Self-pay | Admitting: Neurology

## 2020-06-20 NOTE — Telephone Encounter (Signed)
Had neuro psych eval with Dr. Roseanne Reno in Dec 2021.  No evidence of neuropsychological decline compared to previous.

## 2020-06-23 ENCOUNTER — Other Ambulatory Visit: Payer: Self-pay | Admitting: Neurology

## 2020-06-29 ENCOUNTER — Other Ambulatory Visit: Payer: Self-pay | Admitting: Family Medicine

## 2020-07-01 ENCOUNTER — Other Ambulatory Visit: Payer: Self-pay | Admitting: Family Medicine

## 2020-07-07 ENCOUNTER — Other Ambulatory Visit: Payer: Medicare Other

## 2020-07-08 ENCOUNTER — Telehealth: Payer: Self-pay

## 2020-07-08 NOTE — Telephone Encounter (Signed)
Patient calls nurse line requesting to speak with Dr. Maudie Mercury about taking Mucinex. Patient reports that he has had a cough for many months (unable to give exact time frame). Denies sore throat, fever, body aches or any new sick symptoms. Advised patient that he may need to be evaluated in clinic due to length of time he has been experiencing cough. Patient verbalizes understanding.   Please advise how patient should proceed with follow up (virtual visit, [place] [facility] or regular clinic appointment)  Talbot Grumbling, RN

## 2020-07-10 ENCOUNTER — Other Ambulatory Visit: Payer: Self-pay | Admitting: Family Medicine

## 2020-07-10 DIAGNOSIS — J302 Other seasonal allergic rhinitis: Secondary | ICD-10-CM

## 2020-07-11 NOTE — Telephone Encounter (Signed)
This patient has a chronic cough associated with cigarette smoking. He can be seen in the office as a regular visit to address this issue. Thank you!

## 2020-07-13 ENCOUNTER — Other Ambulatory Visit: Payer: Medicare Other

## 2020-07-13 DIAGNOSIS — Z20822 Contact with and (suspected) exposure to covid-19: Secondary | ICD-10-CM

## 2020-07-14 ENCOUNTER — Other Ambulatory Visit: Payer: Medicare Other

## 2020-07-14 NOTE — Telephone Encounter (Signed)
Called patient and scheduled office visit for 1/25.  Talbot Grumbling, RN

## 2020-07-15 ENCOUNTER — Telehealth: Payer: Self-pay

## 2020-07-15 LAB — NOVEL CORONAVIRUS, NAA: SARS-CoV-2, NAA: DETECTED — AB

## 2020-07-15 LAB — SARS-COV-2, NAA 2 DAY TAT

## 2020-07-15 NOTE — Telephone Encounter (Signed)
Patient calls nurse line reporting he tested positive for covid. Conservative measures given to patient for symptom management and ED precautions given. Patient reported he would really like to speak to PCP about this. Advised I would send her a message to call him.

## 2020-07-18 NOTE — Telephone Encounter (Signed)
Called patient as requested.  He reports that he just woke up, but sounded very breathless over the phone and unable to complete sentences. I advised that he seek care; however, he declines and says that he is fine.   Spoke to his wife, [name], over the phone who reports that he tested positive on 07/13/20 and she asked about isolation guidance. I reiterated to her the importance of him being evaluated given his shortness of breath and she reports that he is refusing to go to the ED. Recommended calling EMS so that he can be evaluated at home. She will call EMS now. No further questions at this time. They can call the office if she has any further questions.   Wilber Oliphant, M.D.  10:20 AM 07/18/2020

## 2020-07-19 ENCOUNTER — Ambulatory Visit: Payer: Medicare Other

## 2020-07-25 ENCOUNTER — Other Ambulatory Visit: Payer: Self-pay | Admitting: Neurology

## 2020-07-30 ENCOUNTER — Other Ambulatory Visit: Payer: Self-pay | Admitting: Family Medicine

## 2020-07-30 DIAGNOSIS — J302 Other seasonal allergic rhinitis: Secondary | ICD-10-CM

## 2020-08-18 ENCOUNTER — Telehealth: Payer: Self-pay

## 2020-08-18 ENCOUNTER — Telehealth (HOSPITAL_COMMUNITY): Payer: Self-pay | Admitting: *Deleted

## 2020-08-18 ENCOUNTER — Telehealth: Payer: Self-pay | Admitting: *Deleted

## 2020-08-18 NOTE — Telephone Encounter (Signed)
Pt wife called stating that pt had "taken double dose" of medication. Writer spoke with wife, [name], and pt, and it seems that he accidentally took 2 Tegretol 200 mg this morning. Pt advised to hold HS dose, push fluids, monitor Bp which they can do at home. Pt advised to go to UC or ED if symptomatic in any way. Pt and wife verbalize understanding. Anything else? Please advise.

## 2020-08-18 NOTE — Telephone Encounter (Signed)
Please call pt has question about,his medication he took this morning. Please call [phone]

## 2020-08-18 NOTE — Telephone Encounter (Signed)
Spoke to pt and he took, memantine 10mg  this am, but forgot and then took another dose lalter that morning.  So has had his 20mg  po daily dose today.  I told him to not take the evening dose of memantine and then start again tomorrow taking 10mg  po bid.  He stated feels fine.  He is with his wife now, but normally lives alone.  If he has problems to seek care acutely.  Took lorazepam one tablet this am.  He states that he takes one daily.  He has been in touch with other MD;s.

## 2020-08-18 NOTE — Telephone Encounter (Signed)
Patient calls nurse line reporting he took "double" his morning medications this morning. Patient reports he took the double dose of medications "early" the morning. Patient reports he feels completely normal denies drowsiness, mental status changes, etc... Patient reports he took (2) of the following:  Pantoprazole-PCP Cetirizine-PCP Atorvastatin-PCP Finasteride- PCP  Carbamazepine- prescribed by [facility] Lorazepam- prescribed by Neurology Memantine- prescribed by Neurology    Patient advised to call [facility] and Neurology for meds prescribed by them. I will send PCP a message for further instruction on first four meds prescribed by our office.   ED precautions given to patient.

## 2020-08-18 NOTE — Telephone Encounter (Signed)
Watch for sedation, dizziness, nystagmus and tremors. Should be clear if holding HS dose.

## 2020-08-19 NOTE — Telephone Encounter (Signed)
I think this management is appropriate. He should be assessed in the ED if he does note any mental status changes. No further intervention or call is needed at this time. Matilde Haymaker, MD

## 2020-08-25 ENCOUNTER — Other Ambulatory Visit: Payer: Self-pay | Admitting: Family Medicine

## 2020-08-25 DIAGNOSIS — E785 Hyperlipidemia, unspecified: Secondary | ICD-10-CM

## 2020-08-27 ENCOUNTER — Other Ambulatory Visit: Payer: Self-pay | Admitting: Family Medicine

## 2020-08-27 ENCOUNTER — Other Ambulatory Visit: Payer: Self-pay | Admitting: Neurology

## 2020-08-27 DIAGNOSIS — E785 Hyperlipidemia, unspecified: Secondary | ICD-10-CM

## 2020-08-30 ENCOUNTER — Other Ambulatory Visit: Payer: Self-pay | Admitting: Neurology

## 2020-08-30 ENCOUNTER — Other Ambulatory Visit: Payer: Self-pay | Admitting: Family Medicine

## 2020-08-30 DIAGNOSIS — E785 Hyperlipidemia, unspecified: Secondary | ICD-10-CM

## 2020-09-01 ENCOUNTER — Telehealth (INDEPENDENT_AMBULATORY_CARE_PROVIDER_SITE_OTHER): Payer: Medicare Other | Admitting: Psychiatry

## 2020-09-01 ENCOUNTER — Other Ambulatory Visit: Payer: Self-pay

## 2020-09-01 DIAGNOSIS — F09 Unspecified mental disorder due to known physiological condition: Secondary | ICD-10-CM

## 2020-09-01 DIAGNOSIS — F319 Bipolar disorder, unspecified: Secondary | ICD-10-CM

## 2020-09-01 MED ORDER — QUETIAPINE FUMARATE 200 MG PO TABS: 200.0000 mg | ORAL_TABLET | Freq: Every day | ORAL | 2 refills | Status: DC

## 2020-09-01 MED ORDER — CARBAMAZEPINE 200 MG PO TABS: ORAL_TABLET | ORAL | 2 refills | Status: DC
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
## 2020-09-05 ENCOUNTER — Other Ambulatory Visit (HOSPITAL_COMMUNITY): Payer: Self-pay | Admitting: Psychiatry

## 2020-09-05 DIAGNOSIS — F319 Bipolar disorder, unspecified: Secondary | ICD-10-CM

## 2020-09-12 ENCOUNTER — Other Ambulatory Visit: Payer: Self-pay | Admitting: Family Medicine

## 2020-09-23 ENCOUNTER — Other Ambulatory Visit: Payer: Self-pay | Admitting: Neurology

## 2020-09-25 ENCOUNTER — Other Ambulatory Visit: Payer: Self-pay | Admitting: Family Medicine

## 2020-09-26 ENCOUNTER — Other Ambulatory Visit: Payer: Self-pay | Admitting: Family Medicine

## 2020-09-26 ENCOUNTER — Other Ambulatory Visit: Payer: Self-pay | Admitting: Neurology

## 2020-09-26 NOTE — Telephone Encounter (Signed)
Lorazepam 0.5mg  #60 last filled on 08/30/20. Pt last OV with Judson Roch, NP on 04/25/20 and next OV scheduled for 05/11/21.

## 2020-10-06 ENCOUNTER — Other Ambulatory Visit: Payer: Self-pay | Admitting: Family Medicine

## 2020-10-06 DIAGNOSIS — J302 Other seasonal allergic rhinitis: Secondary | ICD-10-CM

## 2020-10-22 ENCOUNTER — Other Ambulatory Visit: Payer: Self-pay | Admitting: Neurology

## 2020-10-26 ENCOUNTER — Other Ambulatory Visit: Payer: Self-pay | Admitting: Neurology

## 2020-11-20 ENCOUNTER — Other Ambulatory Visit: Payer: Self-pay | Admitting: Student in an Organized Health Care Education/Training Program

## 2020-11-20 DIAGNOSIS — E785 Hyperlipidemia, unspecified: Secondary | ICD-10-CM

## 2020-11-25 ENCOUNTER — Other Ambulatory Visit: Payer: Self-pay | Admitting: Student in an Organized Health Care Education/Training Program

## 2020-11-25 DIAGNOSIS — E785 Hyperlipidemia, unspecified: Secondary | ICD-10-CM

## 2020-12-05 ENCOUNTER — Other Ambulatory Visit (HOSPITAL_COMMUNITY): Payer: Self-pay | Admitting: Psychiatry

## 2020-12-05 ENCOUNTER — Other Ambulatory Visit: Payer: Self-pay | Admitting: Family Medicine

## 2020-12-05 DIAGNOSIS — F319 Bipolar disorder, unspecified: Secondary | ICD-10-CM

## 2020-12-06 ENCOUNTER — Other Ambulatory Visit: Payer: Self-pay | Admitting: Family Medicine

## 2020-12-09 ENCOUNTER — Other Ambulatory Visit (HOSPITAL_COMMUNITY): Payer: Self-pay | Admitting: Psychiatry

## 2020-12-09 DIAGNOSIS — F319 Bipolar disorder, unspecified: Secondary | ICD-10-CM

## 2020-12-22 ENCOUNTER — Other Ambulatory Visit: Payer: Self-pay | Admitting: Neurology

## 2020-12-22 ENCOUNTER — Other Ambulatory Visit (HOSPITAL_COMMUNITY): Payer: Self-pay | Admitting: Psychiatry

## 2020-12-22 DIAGNOSIS — F319 Bipolar disorder, unspecified: Secondary | ICD-10-CM

## 2020-12-22 NOTE — Telephone Encounter (Signed)
I spoke to pt then wife.  He is taking the lorazepam BID (not prn).  I relayed to both of them it is not to be taken on regular basis.  She said it says that on bottle,  I relayed it is meant to be BID prn.  He has done well with that.  I told her to bring it down to once daily as needed and see how he does and change the instruction on bottle to state that.  Has appt 04/2021. She verbalized understanding.

## 2020-12-22 NOTE — Telephone Encounter (Signed)
I will refill the Ativan, but can you check with the patient's wife to determine if he is indeed taking twice daily scheduled. The refill comes through consistently, I was under impression this was not a scheduled medication. We need an update on him and this medication usage.

## 2020-12-24 ENCOUNTER — Other Ambulatory Visit: Payer: Self-pay | Admitting: Family Medicine

## 2020-12-28 ENCOUNTER — Telehealth (HOSPITAL_COMMUNITY): Payer: Self-pay

## 2020-12-28 ENCOUNTER — Other Ambulatory Visit (HOSPITAL_COMMUNITY): Payer: Self-pay

## 2020-12-28 DIAGNOSIS — F319 Bipolar disorder, unspecified: Secondary | ICD-10-CM

## 2020-12-28 MED ORDER — QUETIAPINE FUMARATE 200 MG PO TABS: 200.0000 mg | ORAL_TABLET | Freq: Every day | ORAL | 1 refills | Status: DC

## 2020-12-28 NOTE — Telephone Encounter (Signed)
He missed last appointment. I send one time refill until his next appointment. He need to keep appointment for future refills.

## 2020-12-28 NOTE — Telephone Encounter (Signed)
Received a fax from [facility] on [facility] [facility] requesting a refill on patient's Quetiapine 200mg . It was last sent in on 09/01/20 #30 w/2 refills. He takes 1 tab po qhs. Patient has followup appointment scheduled for 8/22.  Thank you

## 2021-01-03 ENCOUNTER — Other Ambulatory Visit: Payer: Self-pay | Admitting: Family Medicine

## 2021-01-03 DIAGNOSIS — J302 Other seasonal allergic rhinitis: Secondary | ICD-10-CM

## 2021-01-25 ENCOUNTER — Other Ambulatory Visit: Payer: Self-pay | Admitting: *Deleted

## 2021-01-25 MED ORDER — LORAZEPAM 0.5 MG PO TABS: 0.5000 mg | ORAL_TABLET | Freq: Two times a day (BID) | ORAL | 1 refills | Status: DC | PRN

## 2021-01-26 ENCOUNTER — Telehealth: Payer: Self-pay | Admitting: Family Medicine

## 2021-01-26 NOTE — Telephone Encounter (Signed)
**  After Hours/ Emergency Line Call**  Returned call at [phone].  No answer and LVM for patient to return call at [phone] to speak with MD.  If having an Emergency to call 911 or go to ED.  Alcus Dad PGY-3, [facility] 01/26/2021 8:42 PM

## 2021-02-10 ENCOUNTER — Telehealth: Payer: Self-pay

## 2021-02-10 ENCOUNTER — Other Ambulatory Visit: Payer: Self-pay

## 2021-02-10 DIAGNOSIS — Z86010 Personal history of colonic polyps: Secondary | ICD-10-CM

## 2021-02-10 DIAGNOSIS — D122 Benign neoplasm of ascending colon: Secondary | ICD-10-CM

## 2021-02-10 DIAGNOSIS — R933 Abnormal findings on diagnostic imaging of other parts of digestive tract: Secondary | ICD-10-CM

## 2021-02-10 DIAGNOSIS — D127 Benign neoplasm of rectosigmoid junction: Secondary | ICD-10-CM

## 2021-02-10 MED ORDER — SUPREP BOWEL PREP KIT 17.5-3.13-1.6 GM/177ML PO SOLN: 1.0000 | ORAL | 0 refills | Status: DC

## 2021-02-10 NOTE — Telephone Encounter (Signed)
Any medical history changes since last OV? NO   Any medication changes since last OV?  NO   Any  type of blood thinner? NO   Do you use oxygen at any time? NO   Patient has been scheduled at [facility] on 03/23/21 for a Colonoscopy.  Prep if needed has been sent to their pharmacy. They are asked to arrive at 6:00am for a 7:30am procedure.  Instructions reviewed with the patient and mailed to their home.  They will call back with any questions once they are received.

## 2021-02-13 ENCOUNTER — Encounter (HOSPITAL_COMMUNITY): Payer: Self-pay | Admitting: Psychiatry

## 2021-02-13 ENCOUNTER — Other Ambulatory Visit: Payer: Self-pay

## 2021-02-13 ENCOUNTER — Telehealth (INDEPENDENT_AMBULATORY_CARE_PROVIDER_SITE_OTHER): Payer: Medicare Other | Admitting: Psychiatry

## 2021-02-13 DIAGNOSIS — F319 Bipolar disorder, unspecified: Secondary | ICD-10-CM

## 2021-02-13 DIAGNOSIS — F09 Unspecified mental disorder due to known physiological condition: Secondary | ICD-10-CM

## 2021-02-13 MED ORDER — CARBAMAZEPINE 200 MG PO TABS: ORAL_TABLET | ORAL | 2 refills | Status: DC

## 2021-02-13 MED ORDER — QUETIAPINE FUMARATE 200 MG PO TABS: 200.0000 mg | ORAL_TABLET | Freq: Every day | ORAL | 2 refills | Status: DC
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
## 2021-02-26 ENCOUNTER — Other Ambulatory Visit: Payer: Self-pay | Admitting: Family Medicine

## 2021-03-14 ENCOUNTER — Telehealth: Payer: Self-pay | Admitting: *Deleted

## 2021-03-14 NOTE — Telephone Encounter (Signed)
Patient wants to discuss the medications he is currently taking and making sure that they won't interfere with the medication he will need for his colonoscopy.  Will forward to MD to reach out to patient about this.  He can be reached at [phone].  Rex Oesterle,CMA

## 2021-03-16 ENCOUNTER — Telehealth: Payer: Self-pay | Admitting: Family Medicine

## 2021-03-16 NOTE — Telephone Encounter (Signed)
Called pt to discuss his concerns regarding medications. He said she received medications from a church for a cough. He has had this cough for 3 years.  He was intending to give me the name of the medication to see if he okay to take it. He was given Genexa at [employer]. I explained that this is children's tylenol and he should not take this. He should take regular tylenol. Explained that he should book a clinic appointment if her has further questions.

## 2021-03-16 NOTE — Telephone Encounter (Signed)
Spoke with pt this evening. Thank you.

## 2021-03-19 ENCOUNTER — Other Ambulatory Visit: Payer: Self-pay | Admitting: Family Medicine

## 2021-03-21 ENCOUNTER — Encounter (HOSPITAL_COMMUNITY): Payer: Self-pay | Admitting: Gastroenterology

## 2021-03-21 NOTE — Progress Notes (Addendum)
Mr. Stephen Buckley denies chest pain or shortness of breath.  Patient denies having any s/s of Covid in his household.  Patient denies any known exposure to Covid.   Mr Stephen Buckley does not have questions regarding the colon prep.  I instructed patient to take Aspirin after the procedure if it is ok with his Dr.

## 2021-03-23 ENCOUNTER — Other Ambulatory Visit: Payer: Self-pay

## 2021-03-23 ENCOUNTER — Encounter (HOSPITAL_COMMUNITY): Payer: Self-pay | Admitting: Gastroenterology

## 2021-03-23 ENCOUNTER — Ambulatory Visit (HOSPITAL_COMMUNITY)
Admission: RE | Admit: 2021-03-23 | Discharge: 2021-03-23 | Disposition: A | Discharge status: Home / Self Care | Payer: Medicare Other | Source: Ambulatory Visit | Attending: Gastroenterology | Admitting: Gastroenterology

## 2021-03-23 ENCOUNTER — Ambulatory Visit (HOSPITAL_COMMUNITY): Payer: Medicare Other | Admitting: Certified Registered Nurse Anesthetist

## 2021-03-23 ENCOUNTER — Encounter (HOSPITAL_COMMUNITY)
Admission: RE | Disposition: A | Discharge status: Home / Self Care | Payer: Self-pay | Source: Ambulatory Visit | Attending: Gastroenterology

## 2021-03-23 DIAGNOSIS — K635 Polyp of colon: Secondary | ICD-10-CM

## 2021-03-23 DIAGNOSIS — Z09 Encounter for follow-up examination after completed treatment for conditions other than malignant neoplasm: Secondary | ICD-10-CM | POA: Diagnosis present

## 2021-03-23 DIAGNOSIS — D128 Benign neoplasm of rectum: Secondary | ICD-10-CM | POA: Diagnosis not present

## 2021-03-23 DIAGNOSIS — K621 Rectal polyp: Secondary | ICD-10-CM | POA: Diagnosis not present

## 2021-03-23 DIAGNOSIS — K641 Second degree hemorrhoids: Secondary | ICD-10-CM | POA: Diagnosis not present

## 2021-03-23 DIAGNOSIS — D123 Benign neoplasm of transverse colon: Secondary | ICD-10-CM | POA: Diagnosis not present

## 2021-03-23 DIAGNOSIS — Z86010 Personal history of colonic polyps: Secondary | ICD-10-CM

## 2021-03-23 DIAGNOSIS — D122 Benign neoplasm of ascending colon: Secondary | ICD-10-CM

## 2021-03-23 DIAGNOSIS — K644 Residual hemorrhoidal skin tags: Secondary | ICD-10-CM | POA: Diagnosis not present

## 2021-03-23 DIAGNOSIS — Q438 Other specified congenital malformations of intestine: Secondary | ICD-10-CM | POA: Insufficient documentation

## 2021-03-23 DIAGNOSIS — K5730 Diverticulosis of large intestine without perforation or abscess without bleeding: Secondary | ICD-10-CM | POA: Diagnosis not present

## 2021-03-23 DIAGNOSIS — Z9889 Other specified postprocedural states: Secondary | ICD-10-CM | POA: Insufficient documentation

## 2021-03-23 DIAGNOSIS — D124 Benign neoplasm of descending colon: Secondary | ICD-10-CM | POA: Diagnosis not present

## 2021-03-23 DIAGNOSIS — R933 Abnormal findings on diagnostic imaging of other parts of digestive tract: Secondary | ICD-10-CM

## 2021-03-23 HISTORY — PX: POLYPECTOMY: SHX5525

## 2021-03-23 HISTORY — PX: COLONOSCOPY WITH PROPOFOL: SHX5780

## 2021-03-23 SURGERY — COLONOSCOPY WITH PROPOFOL
Anesthesia: Monitor Anesthesia Care

## 2021-03-23 MED ORDER — SODIUM CHLORIDE 0.9 % IV SOLN: INTRAVENOUS | Status: DC

## 2021-03-23 MED ORDER — LACTATED RINGERS IV SOLN: INTRAVENOUS | Status: DC | PRN

## 2021-03-23 MED ORDER — PROPOFOL 500 MG/50ML IV EMUL
INTRAVENOUS | Status: DC | PRN
  Administered 2021-03-23: 75 ug/kg/min via INTRAVENOUS

## 2021-03-23 MED ORDER — PROPOFOL 10 MG/ML IV BOLUS
INTRAVENOUS | Status: DC | PRN
  Administered 2021-03-23: 50 mg via INTRAVENOUS

## 2021-03-23 MED ORDER — LIDOCAINE 2% (20 MG/ML) 5 ML SYRINGE
INTRAMUSCULAR | Status: DC | PRN
  Administered 2021-03-23: 60 mg via INTRAVENOUS

## 2021-03-23 SURGICAL SUPPLY — 22 items

## 2021-03-23 NOTE — Transfer of Care (Signed)
Immediate Anesthesia Transfer of Care Note  Patient: Stephen Buckley  Procedure(s) Performed: COLONOSCOPY WITH PROPOFOL POLYPECTOMY  Patient Location: PACU  Anesthesia Type:MAC  Level of Consciousness: awake and alert   Airway & Oxygen Therapy: Patient Spontanous Breathing  Post-op Assessment: Report given to RN  Post vital signs: Reviewed  Last Vitals:  Vitals Value Taken Time  BP 160/97 03/23/21 0821  Temp 36.1 C 03/23/21 0820  Pulse 89 03/23/21 0823  Resp 12 03/23/21 0823  SpO2 97 % 03/23/21 0823  Vitals shown include unvalidated device data.  Last Pain:  Vitals:   03/23/21 0709  TempSrc: Temporal  PainSc: 0-No pain         Complications: No notable events documented.

## 2021-03-23 NOTE — Op Note (Signed)
[facility] Patient Name: Stephen Buckley Procedure Date : 03/23/2021 MRN: [id number] Attending MD: Justice Britain , MD Date of Birth: Jul 02, 1950 CSN: [id number] Age: 70 Admit Type: Outpatient Procedure:                Colonoscopy Indications:              Surveillance: Personal history of piecemeal removal                            of 2 large adenomas on prior colonoscopy Providers:                Justice Britain, MD, Burtis Junes, RN, Hinton Dyer Referring MD:             Lattie Haw Medicines:                Monitored Anesthesia Care Complications:            No immediate complications. Estimated Blood Loss:     Estimated blood loss was minimal. Procedure:                Pre-Anesthesia Assessment:                           - Prior to the procedure, a History and Physical                            was performed, and patient medications and                            allergies were reviewed. The patient's tolerance of                            previous anesthesia was also reviewed. The risks                            and benefits of the procedure and the sedation                            options and risks were discussed with the patient.                            All questions were answered, and informed consent                            was obtained. Prior Anticoagulants: The patient has                            taken no previous anticoagulant or antiplatelet                            agents except for aspirin. ASA Grade Assessment: II                            - A patient with mild systemic disease. After  reviewing the risks and benefits, the patient was                            deemed in satisfactory condition to undergo the                            procedure.                           After obtaining informed consent, the colonoscope                            was passed under direct vision. Throughout the                             procedure, the patient's blood pressure, pulse, and                            oxygen saturations were monitored continuously. The                            CF-HQ190L ([id number]) Olympus colonoscope was                            introduced through the anus and advanced to the 5                            cm into the ileum. The colonoscopy was somewhat                            difficult due to a redundant colon. Successful                            completion of the procedure was aided by changing                            the patient's position, using manual pressure,                            straightening and shortening the scope to obtain                            bowel loop reduction and using scope torsion. The                            patient tolerated the procedure. The quality of the                            bowel preparation was adequate. The terminal ileum,                            ileocecal valve, appendiceal orifice, and rectum  were photographed. Scope In: 7:54:59 AM Scope Out: 8:13:07 AM Scope Withdrawal Time: 0 hours 13 minutes 32 seconds  Total Procedure Duration: 0 hours 18 minutes 8 seconds  Findings:      The digital rectal exam findings include hemorrhoids. Pertinent       negatives include no palpable rectal lesions.      The terminal ileum and ileocecal valve appeared normal.      The colon (entire examined portion) was significantly redundant.      A medium post mucosectomy scar was found in the ascending colon. The       scar tissue was healthy in appearance.      Six sessile polyps were found in the rectum (3), descending colon (2)       and transverse colon (1). The polyps were 2 to 7 mm in size. These       polyps were removed with a cold snare. Resection and retrieval were       complete.      Multiple small and large-mouthed diverticula were found in the entire       colon.      Normal mucosa was found  in the entire colon otherwise.      Non-bleeding non-thrombosed external and internal hemorrhoids were found       during retroflexion, during perianal exam and during digital exam. The       hemorrhoids were Grade II (internal hemorrhoids that prolapse but reduce       spontaneously). Impression:               - Hemorrhoids found on digital rectal exam.                           - The examined portion of the ileum was normal.                           - Redundant colon.                           - Post mucosectomy scar in the ascending colon.                           - Six 2 to 7 mm polyps in the rectum, in the                            descending colon and in the transverse colon,                            removed with a cold snare. Resected and retrieved.                           - Diverticulosis in the entire examined colon.                           - Normal mucosa in the entire examined colon                            otherwise.                           -  Non-bleeding non-thrombosed external and internal                            hemorrhoids. Recommendation:           - The patient will be observed post-procedure,                            until all discharge criteria are met.                           - Discharge patient to home.                           - Patient has a contact number available for                            emergencies. The signs and symptoms of potential                            delayed complications were discussed with the                            patient. Return to normal activities tomorrow.                            Written discharge instructions were provided to the                            patient.                           - High fiber diet.                           - Use FiberCon 1-2 tablets PO daily.                           - Continue present medications.                           - Await pathology results.                            - Repeat colonoscopy in 3 years for surveillance.                           - The findings and recommendations were discussed                            with the patient.                           - The findings and recommendations were discussed                            with the patient's family. Procedure Code(s):        ---  Professional ---                           [id number], Colonoscopy, flexible; with removal of                            tumor(s), polyp(s), or other lesion(s) by snare                            technique Diagnosis Code(s):        --- Professional ---                           K64.1, Second degree hemorrhoids                           Z98.890, Other specified postprocedural states                           K62.1, Rectal polyp                           K63.5, Polyp of colon                           Z09, Encounter for follow-up examination after                            completed treatment for conditions other than                            malignant neoplasm                           Z86.010, Personal history of colonic polyps                           K57.30, Diverticulosis of large intestine without                            perforation or abscess without bleeding                           Q43.8, Other specified congenital malformations of                            intestine CPT copyright 2019 American [employer] Association. All rights reserved. The codes documented in this report are preliminary and upon coder review may  be revised to meet current compliance requirements. Justice Britain, MD 03/23/2021 8:29:05 AM Number of Addenda: 0

## 2021-03-23 NOTE — Discharge Instructions (Signed)
YOU HAD AN ENDOSCOPIC PROCEDURE TODAY: Refer to the procedure report and other information in the discharge instructions given to you for any specific questions about what was found during the examination. If this information does not answer your questions, please call [place] office at [phone] to clarify.  ° °YOU SHOULD EXPECT: Some feelings of bloating in the abdomen. Passage of more gas than usual. Walking can help get rid of the air that was put into your GI tract during the procedure and reduce the bloating. If you had a lower endoscopy (such as a colonoscopy or flexible sigmoidoscopy) you may notice spotting of blood in your stool or on the toilet paper. Some abdominal soreness may be present for a day or two, also. ° °DIET: Your first meal following the procedure should be a light meal and then it is ok to progress to your normal diet. A half-sandwich or bowl of soup is an example of a good first meal. Heavy or fried foods are harder to digest and may make you feel nauseous or bloated. Drink plenty of fluids but you should avoid alcoholic beverages for 24 hours. If you had a esophageal dilation, please see attached instructions for diet.   ° °ACTIVITY: Your care partner should take you home directly after the procedure. You should plan to take it easy, moving slowly for the rest of the day. You can resume normal activity the day after the procedure however YOU SHOULD NOT DRIVE, use power tools, machinery or perform tasks that involve climbing or major physical exertion for 24 hours (because of the sedation medicines used during the test).  ° °SYMPTOMS TO REPORT IMMEDIATELY: °A gastroenterologist can be reached at any hour. Please call [phone]  for any of the following symptoms:  °Following lower endoscopy (colonoscopy, flexible sigmoidoscopy) °Excessive amounts of blood in the stool  °Significant tenderness, worsening of abdominal pains  °Swelling of the abdomen that is new, acute  °Fever of 100° or  higher  °Following upper endoscopy (EGD, EUS, ERCP, esophageal dilation) °Vomiting of blood or coffee ground material  °New, significant abdominal pain  °New, significant chest pain or pain under the shoulder blades  °Painful or persistently difficult swallowing  °New shortness of breath  °Black, tarry-looking or red, bloody stools ° °FOLLOW UP:  °If any biopsies were taken you will be contacted by phone or by letter within the next 1-3 weeks. Call [phone]  if you have not heard about the biopsies in 3 weeks.  °Please also call with any specific questions about appointments or follow up tests. ° °
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
## 2021-03-23 NOTE — Anesthesia Preprocedure Evaluation (Signed)
Anesthesia Evaluation  Patient identified by MRN, date of birth, ID band Patient awake    Reviewed: Allergy & Precautions, NPO status , Patient's Chart, lab work & pertinent test results  History of Anesthesia Complications Negative for: history of anesthetic complications  Airway Mallampati: II  TM Distance: >3 FB Neck ROM: Full    Dental  (+) Dental Advisory Given, Edentulous Upper, Edentulous Lower   Pulmonary asthma , neg recent URI, Current Smoker and Patient abstained from smoking.,    breath sounds clear to auscultation       Cardiovascular hypertension, Pt. on medications (-) angina(-) Past MI and (-) CHF  Rhythm:Regular  - Left ventricle: The cavity size was normal. Wall thickness was  increased in a pattern of mild LVH. Systolic function was normal.  The estimated ejection fraction was in the range of 60% to 65%.  Wall motion was normal; there were no regional wall motion  abnormalities. Codominant mitral inflow - borderline diastolic  dysfunction.  - Mitral valve: Mildly thickened leaflets . There was trivial  regurgitation.  - Left atrium: The atrium was normal in size.  - Inferior vena cava: The vessel was normal in size. The  respirophasic diameter changes were in the normal range (>= 50%),  consistent with normal central venous pressure.    Neuro/Psych Seizures -,  PSYCHIATRIC DISORDERS Anxiety Depression Dementia TIACVA    GI/Hepatic GERD  Medicated,(+) Hepatitis -, CHx colon polyps    Endo/Other  negative endocrine ROS  Renal/GU      Musculoskeletal   Abdominal   Peds  Hematology negative hematology ROS (+)   Anesthesia Other Findings   Reproductive/Obstetrics                             Anesthesia Physical  Anesthesia Plan  ASA: III  Anesthesia Plan: MAC   Post-op Pain Management:    Induction: Intravenous  PONV Risk Score and Plan: 0 and  Treatment may vary due to age or medical condition and Propofol infusion  Airway Management Planned: Nasal Cannula  Additional Equipment: None  Intra-op Plan:   Post-operative Plan:   Informed Consent: I have reviewed the patients History and Physical, chart, labs and discussed the procedure including the risks, benefits and alternatives for the proposed anesthesia with the patient or authorized representative who has indicated his/her understanding and acceptance.     Dental advisory given  Plan Discussed with: CRNA and Surgeon  Anesthesia Plan Comments:         Anesthesia Quick Evaluation

## 2021-03-24 ENCOUNTER — Encounter (HOSPITAL_COMMUNITY): Payer: Self-pay | Admitting: Gastroenterology

## 2021-03-24 ENCOUNTER — Encounter: Payer: Self-pay | Admitting: Gastroenterology

## 2021-03-24 LAB — SURGICAL PATHOLOGY

## 2021-03-24 NOTE — Anesthesia Postprocedure Evaluation (Signed)
Anesthesia Post Note  Patient: Stephen Buckley  Procedure(s) Performed: COLONOSCOPY WITH PROPOFOL POLYPECTOMY     Patient location during evaluation: PACU Anesthesia Type: MAC Level of consciousness: awake and alert Pain management: pain level controlled Vital Signs Assessment: post-procedure vital signs reviewed and stable Respiratory status: spontaneous breathing, nonlabored ventilation, respiratory function stable and patient connected to nasal cannula oxygen Cardiovascular status: stable and blood pressure returned to baseline Postop Assessment: no apparent nausea or vomiting Anesthetic complications: no   No notable events documented.  Last Vitals:  Vitals:   03/23/21 0820 03/23/21 0835  BP: (!) 160/97 (!) 175/90  Pulse: 89 85  Resp: 17 14  Temp: (!) 36.1 C (!) 36.4 C  SpO2: 97% 96%    Last Pain:  Vitals:   03/23/21 0835  TempSrc:   PainSc: 0-No pain                 Tiajuana Amass

## 2021-04-05 ENCOUNTER — Other Ambulatory Visit: Payer: Self-pay | Admitting: Family Medicine

## 2021-04-05 DIAGNOSIS — J302 Other seasonal allergic rhinitis: Secondary | ICD-10-CM

## 2021-04-14 ENCOUNTER — Encounter: Payer: Self-pay | Admitting: Family Medicine

## 2021-04-14 ENCOUNTER — Other Ambulatory Visit: Payer: Self-pay

## 2021-04-14 ENCOUNTER — Ambulatory Visit (INDEPENDENT_AMBULATORY_CARE_PROVIDER_SITE_OTHER): Payer: Medicare Other | Admitting: Family Medicine

## 2021-04-14 ENCOUNTER — Telehealth: Payer: Self-pay

## 2021-04-14 ENCOUNTER — Ambulatory Visit (INDEPENDENT_AMBULATORY_CARE_PROVIDER_SITE_OTHER): Payer: Medicare Other

## 2021-04-14 VITALS — BP 187/94 | HR 101 | Wt 214.4 lb

## 2021-04-14 DIAGNOSIS — Z23 Encounter for immunization: Secondary | ICD-10-CM

## 2021-04-14 DIAGNOSIS — Z122 Encounter for screening for malignant neoplasm of respiratory organs: Secondary | ICD-10-CM

## 2021-04-14 DIAGNOSIS — F17200 Nicotine dependence, unspecified, uncomplicated: Secondary | ICD-10-CM | POA: Diagnosis not present

## 2021-04-14 DIAGNOSIS — J410 Simple chronic bronchitis: Secondary | ICD-10-CM | POA: Diagnosis not present

## 2021-04-14 DIAGNOSIS — R030 Elevated blood-pressure reading, without diagnosis of hypertension: Secondary | ICD-10-CM | POA: Diagnosis not present

## 2021-04-14 MED ORDER — NICOTINE 14 MG/24HR TD PT24: 14.0000 mg | MEDICATED_PATCH | Freq: Every day | TRANSDERMAL | 0 refills | Status: DC

## 2021-04-14 MED ORDER — ZOSTER VAC RECOMB ADJUVANTED 50 MCG/0.5ML IM SUSR: 0.5000 mL | Freq: Once | INTRAMUSCULAR | 0 refills | Status: AC

## 2021-04-14 NOTE — Addendum Note (Signed)
Addended by: Leonia Corona R on: 04/14/2021 12:01 PM   Modules accepted: Orders

## 2021-04-14 NOTE — Patient Instructions (Signed)
Today we started a nicotine patch for you to use for quitting smoking.  I like for you to 1 of these each day on your shoulder.  I would like to see you back in about 3 weeks to see how you are doing with quitting smoking.  Try to reduce the number of cigarettes you are using by 50%.  We have ordered a lung cancer screening with a CT  We are doing your flu shot, COVID shot, pneumonia shot, and I have sent a prescription in for your shingles shot to your pharmacy.  Your blood pressure was elevated today.  I would like to check your blood pressures at home for the next 1 week after resting and return in 1 week for blood pressure recheck as we may need to start medications.

## 2021-04-14 NOTE — Assessment & Plan Note (Signed)
Will order nicotine patch per patient request. Follow up in 3 weeks to reassess smoking. Will order lung cancer screening

## 2021-04-14 NOTE — Progress Notes (Signed)
    SUBJECTIVE:   CHIEF COMPLAINT / HPI:   Smoking cessation: 70 year old male presenting for above. Current medications include atorvastatin, aspirin, carbamazepine, finasteride, Namenda, Protonix, Seroquel, Flomax, Incruse Ellipta. He is a current smoker smoking 1/2 packs per day for 50 years. He is interested in lung cancer screening.   Need for immunizations: Patient states he would like to get his flu vaccine, COVID shot, pneumonia shot, and shingles vaccine.  Elevated blood pressure: Blood pressure elevated in office today.  Patient not on any antihypertensive but he did get a blood pressure cuff at home which he has not started using.  PERTINENT  PMH / PSH: Current smoker.  OBJECTIVE:   BP (!) 187/94   Pulse (!) 101   Wt 214 lb 6.4 oz (97.3 kg)   BMI 31.66 kg/m    BP recheck  142/88 Pulse recheck 88  General: NAD, pleasant, able to participate in exam Cardiac: RRR, no murmurs. Respiratory: CTAB, normal effort, No wheezes, rales or rhonchi Extremities: no edema or cyanosis. Skin: warm and dry, no rashes noted Neuro: alert, no obvious focal deficits Psych: Normal affect and mood  ASSESSMENT/PLAN:   Smokers' cough ([place]) Will order nicotine patch per patient request. Follow up in 3 weeks to reassess smoking. Will order lung cancer screening    Will provide flu shot, COVID shot, pneumonia shot, rx for shingles vaccine.  Elevated blood pressure: Patient plans to check his blood pressure at home and return in 1 week for blood pressure recheck.  He may indicate medications.  Lurline Del, [place]

## 2021-04-14 NOTE — Telephone Encounter (Signed)
Called patient and informed him of CT scan appointment.  [facility] 05/02/2021 1520 with arrival at [place], [address]  Patient verbalized understanding and wrote information down because he states that he has a bad memory.  Ozella Almond, [place]

## 2021-04-21 ENCOUNTER — Telehealth: Payer: Self-pay

## 2021-04-21 NOTE — Telephone Encounter (Signed)
Patient calls nurse line requesting to speak with the doctor covering for Dr. Posey Pronto. Attempted to gather more information, patient states that he will call back when he "has his thoughts gathered"  Will await returned phone call.   Talbot Grumbling, RN

## 2021-04-24 ENCOUNTER — Ambulatory Visit (INDEPENDENT_AMBULATORY_CARE_PROVIDER_SITE_OTHER): Payer: Medicare Other

## 2021-04-24 ENCOUNTER — Other Ambulatory Visit: Payer: Self-pay

## 2021-04-24 VITALS — BP 183/90 | HR 89

## 2021-04-24 DIAGNOSIS — Z0130 Encounter for examination of blood pressure without abnormal findings: Secondary | ICD-10-CM

## 2021-04-24 NOTE — Progress Notes (Signed)
Patient presents to nurse clinic for BP check. BP at last visit on 04/14/21 was 187/94. Patient is not currently on BP medications.   BP today: 183/90 HR:89 SpO2: 98%  Patient is currently asymptomatic. Precepted with Dr. McDiarmid who recommend that patient follow up with Dr. Vanessa [place] in the next four weeks.   Patient previously scheduled on 11/30 with Dr. Vanessa [place]. Patient unable to schedule for sooner appointment due to patient needing morning appointment. Will route to Dr. Vanessa [place] for further recommendations.   Patient provided with return to care and ED precautions.   Talbot Grumbling, RN

## 2021-04-24 NOTE — Patient Instructions (Signed)
It was nice to see you today. Your blood pressure remained elevated at today's visit. I will send this message to Dr. Vanessa Maynard to see if he has any additional recommendations for you prior to your visit on 11/30.   Please return to care as soon as possible if you begin having severe headaches, visual changes, chest pain or shortness of breath.   I will call you with any additional recommendations from Dr. Vanessa .   Have a nice day.   Talbot Grumbling, RN

## 2021-04-25 ENCOUNTER — Ambulatory Visit: Payer: Medicare Other | Admitting: Neurology

## 2021-04-27 ENCOUNTER — Other Ambulatory Visit: Payer: Self-pay

## 2021-04-27 ENCOUNTER — Ambulatory Visit (INDEPENDENT_AMBULATORY_CARE_PROVIDER_SITE_OTHER): Payer: Medicare Other | Admitting: Family Medicine

## 2021-04-27 VITALS — BP 160/80 | HR 97 | Ht 69.0 in | Wt 215.5 lb

## 2021-04-27 DIAGNOSIS — N401 Enlarged prostate with lower urinary tract symptoms: Secondary | ICD-10-CM | POA: Diagnosis not present

## 2021-04-27 DIAGNOSIS — R3911 Hesitancy of micturition: Secondary | ICD-10-CM

## 2021-04-27 DIAGNOSIS — I10 Essential (primary) hypertension: Secondary | ICD-10-CM

## 2021-04-27 DIAGNOSIS — E785 Hyperlipidemia, unspecified: Secondary | ICD-10-CM

## 2021-04-27 MED ORDER — TAMSULOSIN HCL 0.4 MG PO CAPS: 0.8000 mg | ORAL_CAPSULE | Freq: Every day | ORAL | 0 refills | Status: DC

## 2021-04-27 MED ORDER — AMLODIPINE BESYLATE 10 MG PO TABS: 10.0000 mg | ORAL_TABLET | Freq: Every day | ORAL | 3 refills | Status: DC

## 2021-04-27 NOTE — Assessment & Plan Note (Signed)
Taking Lipitor.  Lipid panel today.

## 2021-04-27 NOTE — Assessment & Plan Note (Signed)
Since blood pressure has been elevated since the end of September.  Blood pressure is uncontrolled.  BP today 160/80.  Per chart review, patient supposed to be taking 10 mg amlodipine but has not taken this medication.  Restart amlodipine 10 mg.  BMP and lipid panel today

## 2021-04-27 NOTE — Progress Notes (Signed)
   SUBJECTIVE:   CHIEF COMPLAINT / HPI:   Chief Complaint  Patient presents with   Follow-up    bp     Stephen Buckley is a 70 y.o. male here for BP follow up.  Blood pressures have been elevated recently. Had a RN visit a few days ago and his blood pressure was elevated there as well.   Doesn't endorse increased stress. No vision change or headaches.   Had stop and go urinary flow. Has urinary incontinence as well and wears adult underwear. Uses Flomax.    PERTINENT  PMH / PSH: reviewed and updated as appropriate   OBJECTIVE:   BP (!) 160/80   Pulse 97   Ht 5\' 9"  (1.753 m)   Wt 215 lb 8 oz (97.8 kg)   SpO2 100%   BMI 31.82 kg/m    GEN: pleasant older male, in no acute distress  CV: regular rate and rhythm RESP: no increased work of breathing, clear to ascultation bilaterally MSK: no edema, or calf tenderness SKIN: warm, dry    ASSESSMENT/PLAN:   BPH (benign prostatic hyperplasia) Uncontrolled.  Per Dr. Julianne Rice notes, patient should be taken 0.8 mg Flomax.  Increased dose sent to pharmacy.  Reminded patient to take 2 tablets daily.  Essential hypertension Since blood pressure has been elevated since the end of September.  Blood pressure is uncontrolled.  BP today 160/80.  Per chart review, patient supposed to be taking 10 mg amlodipine but has not taken this medication.  Restart amlodipine 10 mg.  BMP and lipid panel today  Dyslipidemia Taking Lipitor.  Lipid panel today.     Discuss Shingles vaccine at next visit.   Stephen Hensen, DO PGY-3, [facility] 04/27/2021

## 2021-04-27 NOTE — Patient Instructions (Signed)
It was great seeing you today!  Follow up with Dr. Vanessa [place] on 05/24/21.    Visit Remembers: - Stop by the pharmacy to pick up your prescriptions  - Continue to work on your healthy eating habits and incorporating exercise into your daily life.  - Your goal is to have an BP < 130/80 - Medicine Changes: Restart amlodipine 10 mg for your blood pressure. Increase Flomax (Tamulosin) to 2-tablets daily.   Regarding lab work today:  Due to recent changes in healthcare laws, you may see the results of your imaging and laboratory studies on MyChart before your provider has had a chance to review them.  I understand that in some cases there may be results that are confusing or concerning to you. Not all laboratory results come back in the same time frame and you may be waiting for multiple results in order to interpret others.  Please give [place] 72 hours in order for your provider to thoroughly review all the results before contacting the office for clarification of your results. If everything is normal, you will get a letter in the mail or a message in My Chart. Please give [place] a call if you do not hear from [place] after 2 weeks.  Please bring all of your medications with you to each visit.    Feel free to call with any questions or concerns at any time, at [phone].   Take care,  Dr. Rushie [facility]

## 2021-04-27 NOTE — Assessment & Plan Note (Signed)
Uncontrolled.  Per Dr. Julianne Rice notes, patient should be taken 0.8 mg Flomax.  Increased dose sent to pharmacy.  Reminded patient to take 2 tablets daily.

## 2021-04-28 LAB — BASIC METABOLIC PANEL
BUN/Creatinine Ratio: 14 (ref 10–24)
BUN: 11 mg/dL (ref 8–27)
CO2: 21 mmol/L (ref 20–29)
Calcium: 9.6 mg/dL (ref 8.6–10.2)
Chloride: 108 mmol/L — ABNORMAL HIGH (ref 96–106)
Creatinine, Ser: 0.78 mg/dL (ref 0.76–1.27)
Glucose: 122 mg/dL — ABNORMAL HIGH (ref 70–99)
Potassium: 4.6 mmol/L (ref 3.5–5.2)
Sodium: 143 mmol/L (ref 134–144)
eGFR: 97 mL/min/{1.73_m2} (ref >59)

## 2021-04-28 LAB — LIPID PANEL
Chol/HDL Ratio: 4.9 ratio (ref 0.0–5.0)
Cholesterol, Total: 136 mg/dL (ref 100–199)
HDL: 28 mg/dL — ABNORMAL LOW (ref >39)
LDL Chol Calc (NIH): 87 mg/dL (ref 0–99)
Triglycerides: 112 mg/dL (ref 0–149)
VLDL Cholesterol Cal: 21 mg/dL (ref 5–40)

## 2021-05-01 ENCOUNTER — Other Ambulatory Visit: Payer: Self-pay | Admitting: Neurology

## 2021-05-01 ENCOUNTER — Telehealth: Payer: Self-pay

## 2021-05-01 NOTE — Telephone Encounter (Signed)
PHONE RM CAN RELAY  Rx for ativan was denied, pt needs to schedule and complete appt for further refills. Pt can see Dr April Manson.

## 2021-05-02 ENCOUNTER — Ambulatory Visit: Payer: Medicare Other

## 2021-05-05 ENCOUNTER — Other Ambulatory Visit: Payer: Self-pay | Admitting: Neurology

## 2021-05-05 ENCOUNTER — Ambulatory Visit
Admission: RE | Admit: 2021-05-05 | Discharge: 2021-05-05 | Disposition: A | Discharge status: Home / Self Care | Payer: Medicare Other | Source: Ambulatory Visit | Attending: Family Medicine | Admitting: Family Medicine

## 2021-05-05 DIAGNOSIS — F17210 Nicotine dependence, cigarettes, uncomplicated: Secondary | ICD-10-CM | POA: Diagnosis not present

## 2021-05-05 DIAGNOSIS — Z122 Encounter for screening for malignant neoplasm of respiratory organs: Secondary | ICD-10-CM

## 2021-05-08 ENCOUNTER — Telehealth: Payer: Self-pay | Admitting: Neurology

## 2021-05-08 NOTE — Telephone Encounter (Signed)
Pt's wife called wanting her husband to have a virtual visit instead if that will be okay. I will cal the pt back to reschedule with who he needs to see.

## 2021-05-11 ENCOUNTER — Ambulatory Visit: Payer: Medicare Other | Admitting: Neurology

## 2021-05-16 ENCOUNTER — Telehealth (HOSPITAL_COMMUNITY): Payer: Self-pay | Admitting: *Deleted

## 2021-05-16 ENCOUNTER — Other Ambulatory Visit: Payer: Self-pay

## 2021-05-16 ENCOUNTER — Telehealth (HOSPITAL_BASED_OUTPATIENT_CLINIC_OR_DEPARTMENT_OTHER): Payer: Medicare Other | Admitting: Psychiatry

## 2021-05-16 ENCOUNTER — Encounter (HOSPITAL_COMMUNITY): Payer: Self-pay | Admitting: Psychiatry

## 2021-05-16 ENCOUNTER — Other Ambulatory Visit (HOSPITAL_COMMUNITY): Payer: Self-pay | Admitting: *Deleted

## 2021-05-16 DIAGNOSIS — F09 Unspecified mental disorder due to known physiological condition: Secondary | ICD-10-CM

## 2021-05-16 DIAGNOSIS — F319 Bipolar disorder, unspecified: Secondary | ICD-10-CM

## 2021-05-16 DIAGNOSIS — Z79899 Other long term (current) drug therapy: Secondary | ICD-10-CM

## 2021-05-16 MED ORDER — QUETIAPINE FUMARATE 200 MG PO TABS: 200.0000 mg | ORAL_TABLET | Freq: Every day | ORAL | 2 refills | Status: DC

## 2021-05-16 MED ORDER — CARBAMAZEPINE 200 MG PO TABS: ORAL_TABLET | ORAL | 2 refills | Status: DC
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
## 2021-05-16 NOTE — Telephone Encounter (Signed)
Lab orders faxed to [facility]. [employer]. As pt has an appointment with [facility] tomorrow. Pt wife verbalizes understanding and agrees.

## 2021-05-17 ENCOUNTER — Other Ambulatory Visit: Payer: Self-pay

## 2021-05-17 ENCOUNTER — Other Ambulatory Visit: Payer: Self-pay | Admitting: Neurology

## 2021-05-17 ENCOUNTER — Encounter: Payer: Self-pay | Admitting: Neurology

## 2021-05-17 ENCOUNTER — Ambulatory Visit (INDEPENDENT_AMBULATORY_CARE_PROVIDER_SITE_OTHER): Payer: Medicare Other | Admitting: Neurology

## 2021-05-17 VITALS — BP 148/85 | HR 108 | Ht 69.0 in | Wt 215.0 lb

## 2021-05-17 DIAGNOSIS — S069X9S Unspecified intracranial injury with loss of consciousness of unspecified duration, sequela: Secondary | ICD-10-CM | POA: Diagnosis not present

## 2021-05-17 DIAGNOSIS — F02818 Dementia in other diseases classified elsewhere, unspecified severity, with other behavioral disturbance: Secondary | ICD-10-CM

## 2021-05-17 DIAGNOSIS — F09 Unspecified mental disorder due to known physiological condition: Secondary | ICD-10-CM | POA: Diagnosis not present

## 2021-05-17 DIAGNOSIS — Z5181 Encounter for therapeutic drug level monitoring: Secondary | ICD-10-CM

## 2021-05-17 DIAGNOSIS — G40909 Epilepsy, unspecified, not intractable, without status epilepticus: Secondary | ICD-10-CM

## 2021-05-17 MED ORDER — LORAZEPAM 0.5 MG PO TABS: 0.5000 mg | ORAL_TABLET | Freq: Two times a day (BID) | ORAL | 5 refills | Status: DC | PRN
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
## 2021-05-17 NOTE — Telephone Encounter (Signed)
Pt called requesting refill for LORazepam (ATIVAN) 0.5 MG tablet. States he went to the pharmacy but they said it had not been sent in yet. [place] ([place]), [place] - [address]

## 2021-05-17 NOTE — Patient Instructions (Signed)
Continue current medications  Will check Carbamazepine level with CMP and Vitamin D level  Return in 1 year

## 2021-05-18 LAB — COMPREHENSIVE METABOLIC PANEL
ALT: 16 IU/L (ref 0–44)
AST: 18 IU/L (ref 0–40)
Albumin/Globulin Ratio: 1.8 (ref 1.2–2.2)
Albumin: 4.6 g/dL (ref 3.8–4.8)
Alkaline Phosphatase: 88 IU/L (ref 44–121)
BUN/Creatinine Ratio: 7 — ABNORMAL LOW (ref 10–24)
BUN: 6 mg/dL — ABNORMAL LOW (ref 8–27)
Bilirubin Total: <0.2 mg/dL (ref 0.0–1.2)
CO2: 23 mmol/L (ref 20–29)
Calcium: 9.1 mg/dL (ref 8.6–10.2)
Chloride: 106 mmol/L (ref 96–106)
Creatinine, Ser: 0.84 mg/dL (ref 0.76–1.27)
Globulin, Total: 2.6 g/dL (ref 1.5–4.5)
Glucose: 127 mg/dL — ABNORMAL HIGH (ref 70–99)
Potassium: 4.2 mmol/L (ref 3.5–5.2)
Sodium: 143 mmol/L (ref 134–144)
Total Protein: 7.2 g/dL (ref 6.0–8.5)
eGFR: 95 mL/min/{1.73_m2} (ref >59)

## 2021-05-18 LAB — VITAMIN D 25 HYDROXY (VIT D DEFICIENCY, FRACTURES): Vit D, 25-Hydroxy: 7.7 ng/mL — ABNORMAL LOW (ref 30.0–100.0)

## 2021-05-18 LAB — CARBAMAZEPINE LEVEL, TOTAL: Carbamazepine (Tegretol), S: 10 ug/mL (ref 4.0–12.0)

## 2021-05-19 ENCOUNTER — Other Ambulatory Visit: Payer: Self-pay | Admitting: Family Medicine

## 2021-05-19 DIAGNOSIS — E785 Hyperlipidemia, unspecified: Secondary | ICD-10-CM

## 2021-05-22 ENCOUNTER — Telehealth: Payer: Self-pay | Admitting: *Deleted

## 2021-05-22 ENCOUNTER — Other Ambulatory Visit: Payer: Self-pay | Admitting: Neurology

## 2021-05-22 MED ORDER — VITAMIN D (ERGOCALCIFEROL) 1.25 MG (50000 UNIT) PO CAPS: 50000.0000 [IU] | ORAL_CAPSULE | ORAL | 0 refills | Status: DC

## 2021-05-22 NOTE — Telephone Encounter (Signed)
-----   Message from Alric Ran, MD sent at 05/22/2021 10:56 AM EST ----- Please call and inform patient that his labs results were within normal limits except his Vit D level which was very Low. I have sent him a new prescription for Vitamin D supplement to take weekly for 8 weeks and he needs to follow up with his PMD.   Alric Ran, MD

## 2021-05-22 NOTE — Telephone Encounter (Signed)
I spoke to the patient. He is in agreement to start the prescribed vitamin D for 8 weeks then follow up with his PCP.

## 2021-05-22 NOTE — Progress Notes (Signed)
    SUBJECTIVE:   CHIEF COMPLAINT / HPI:   Prediabetes:  Most recent A1c on 01/21/2019 of 6.2. Patient does take Seroquel but no medications for prediabetes.  Hypertension: Patient is a 70 y.o. male who present today for follow up of hypertension.   Patient endorses no problems  Home medications include: Amlodipine 10 mg daily Patient endorses taking these medications as prescribed.  Most recent creatinine trend:  Lab Results  Component Value Date   CREATININE 0.84 05/17/2021   CREATININE 0.78 04/27/2021   CREATININE 0.94 04/25/2020   Patient does not check blood pressure at home.  Patient has had a BMP in the past 1 year.  Chronic cough: Cough feels like it has gotten a bit worse but denies increased production of sputum. Recently had CT lung cancer screening which didn't show any cause for concern. Cough mostly at night.  Patient continues to smoke and is not using his daily inhaler very often.  Concern for memory problems: Patient states that he has been forgetting a lot more lately and would like to get set up with her geriatrics clinic   PERTINENT  PMH / PSH: Hypertension  OBJECTIVE:   BP 128/82   Pulse (!) 103   Ht 5\' 9"  (1.753 m)   Wt 217 lb 8 oz (98.7 kg)   SpO2 98%   BMI 32.12 kg/m    General: NAD, pleasant, able to participate in exam Cardiac: Mildly tachycardic with no murmurs Respiratory: Faint breath sounds bilaterally but otherwise clear Extremities: no edema or cyanosis. Skin: warm and dry, no rashes noted Neuro: alert, no obvious focal deficits Psych: Normal affect and mood  ASSESSMENT/PLAN:   Essential hypertension BP today of 128/82, continue amlodipine 10mg  daily.  Prediabetes   A1C today of 6.4. No medications needed. Follow up in 6 months.   Tachycardia: Heart rate of 109, recheck 103.  Patient with no concerns or symptoms with this.  We will check an EKG. EKG showed sinus tachycardia with no ST elevations.  Patient is not  experiencing chest pain or shortness of breath.  We will just continue to monitor.  Concern for memory problems: Patient states he has some concern for increasing memory problems.  He request getting set up with our geriatrics clinic and I think that is reasonable.  We will place that referral.  Lurline Del, [place]

## 2021-05-22 NOTE — Progress Notes (Signed)
Please call and inform patient that his labs results were within normal limits except his Vit D level which was very Low. I have sent him a new prescription for Vitamin D supplement to take weekly for 8 weeks and he needs to follow up with his PMD.   Alric Ran, MD

## 2021-05-24 ENCOUNTER — Ambulatory Visit (INDEPENDENT_AMBULATORY_CARE_PROVIDER_SITE_OTHER): Payer: Medicare Other | Admitting: Family Medicine

## 2021-05-24 ENCOUNTER — Encounter: Payer: Self-pay | Admitting: Family Medicine

## 2021-05-24 ENCOUNTER — Ambulatory Visit (HOSPITAL_COMMUNITY)
Admission: RE | Admit: 2021-05-24 | Discharge: 2021-05-24 | Disposition: A | Discharge status: Home / Self Care | Payer: Medicare Other | Source: Ambulatory Visit | Attending: Family Medicine | Admitting: Family Medicine

## 2021-05-24 ENCOUNTER — Other Ambulatory Visit: Payer: Self-pay

## 2021-05-24 VITALS — BP 128/82 | HR 103 | Ht 69.0 in | Wt 217.5 lb

## 2021-05-24 DIAGNOSIS — I10 Essential (primary) hypertension: Secondary | ICD-10-CM

## 2021-05-24 DIAGNOSIS — J410 Simple chronic bronchitis: Secondary | ICD-10-CM

## 2021-05-24 DIAGNOSIS — R413 Other amnesia: Secondary | ICD-10-CM

## 2021-05-24 DIAGNOSIS — R7303 Prediabetes: Secondary | ICD-10-CM | POA: Insufficient documentation

## 2021-05-24 DIAGNOSIS — R000 Tachycardia, unspecified: Secondary | ICD-10-CM

## 2021-05-24 LAB — POCT GLYCOSYLATED HEMOGLOBIN (HGB A1C): HbA1c, POC (prediabetic range): 6.4 % (ref 5.7–6.4)

## 2021-05-24 MED ORDER — ANORO ELLIPTA 62.5-25 MCG/ACT IN AEPB: 1.0000 | INHALATION_SPRAY | Freq: Every day | RESPIRATORY_TRACT | 1 refills | Status: DC

## 2021-05-24 NOTE — Assessment & Plan Note (Signed)
Patient continues to smoke and is not using his daily inhaler.  We will use Anoro Ellipta which I have sent into the pharmacy.  Strongly recommended he use this daily as well as quit smoking.  Follow-up in 3 months.

## 2021-05-24 NOTE — Assessment & Plan Note (Signed)
  A1C today of 6.4. No medications needed. Follow up in 6 months.

## 2021-05-24 NOTE — Assessment & Plan Note (Signed)
BP today of 128/82, continue amlodipine 10mg  daily.

## 2021-05-24 NOTE — Patient Instructions (Addendum)
We are changing your inhaler and I have sent in a new one that should help with the cough.  It is important to quit smoking to help with your cough.  We will continue to do yearly lung cancer screening.  I would like to see you back in 3 months to see how you are doing.  I am going get you set up with our geriatrics clinic and they should call you once we get an appointment together.  Continue taking your amlodipine for blood pressure.  We screened you for prediabetes today and your A1c was in the prediabetes range so we do not need to do anything different as this is similar to the last time we checked it.

## 2021-06-22 ENCOUNTER — Other Ambulatory Visit: Payer: Self-pay | Admitting: Family Medicine

## 2021-07-03 ENCOUNTER — Other Ambulatory Visit: Payer: Self-pay | Admitting: Family Medicine

## 2021-07-03 DIAGNOSIS — J302 Other seasonal allergic rhinitis: Secondary | ICD-10-CM

## 2021-07-03 DIAGNOSIS — N401 Enlarged prostate with lower urinary tract symptoms: Secondary | ICD-10-CM

## 2021-07-12 ENCOUNTER — Other Ambulatory Visit: Payer: Self-pay | Admitting: Neurology

## 2021-07-13 ENCOUNTER — Ambulatory Visit: Payer: Medicare Other

## 2021-07-14 ENCOUNTER — Other Ambulatory Visit: Payer: Self-pay | Admitting: *Deleted

## 2021-07-14 DIAGNOSIS — J302 Other seasonal allergic rhinitis: Secondary | ICD-10-CM

## 2021-07-14 MED ORDER — FLUTICASONE PROPIONATE 50 MCG/ACT NA SUSP: 2.0000 | Freq: Every day | NASAL | 2 refills | Status: DC

## 2021-07-18 ENCOUNTER — Other Ambulatory Visit: Payer: Self-pay | Admitting: Neurology

## 2021-07-20 ENCOUNTER — Other Ambulatory Visit: Payer: Self-pay | Admitting: Neurology

## 2021-07-25 ENCOUNTER — Other Ambulatory Visit: Payer: Self-pay | Admitting: Neurology

## 2021-07-31 NOTE — Progress Notes (Signed)
° ° ° °  SUBJECTIVE:   CHIEF COMPLAINT / HPI:   Stephen Buckley is a 71 y.o. male presents for chronic cough  Chronic cough Pt reports he has had a 1 year hx of cough and runny nose. Cough is productive intermittently-pink color. Uses Anoro Ellipta. Denies dyspnea, chest pain, fevers, etc. CT lung on 05/05/21 was normal.   Smoking cessation  Never tried to quit before. Pt does smoke-1 pack a day. Smoked for 40 years. He is interested in smoking and interested in patches.  [place] Office Visit from 08/02/2021 in [facility]  PHQ-9 Total Score 2       PERTINENT  PMH / PSH: BPH, seizure disorder, TBI  OBJECTIVE:   BP 136/62    Pulse 76    Ht 5\' 9"  (1.753 m)    Wt 211 lb 12.8 oz (96.1 kg)    SpO2 99%    BMI 31.28 kg/m    BP 136/62   General: Alert, no acute distress Cardio: Normal S1 and S2, RRR, no r/m/g Pulm: CTAB, normal work of breathing Abdomen: Bowel sounds normal. Abdomen soft and non-tender.  Extremities: No peripheral edema.  Neuro: Cranial nerves grossly intact   ASSESSMENT/PLAN:   Tobacco use Discussed smoking cessation. Pt is interested in quitting and is in interested in patches. Prescribed 21mg  nicotine daily. Booked f/u with Dr Valentina Lucks for further management on 08/10/21.  Smokers' cough ([place]) Stable. This was addressed at previous visit. Discussed smoking cessation. See below. Continue Anoro Ellipta. Lung cancer screening up to date.    Lattie Haw, MD PGY-3 [place]

## 2021-08-02 ENCOUNTER — Other Ambulatory Visit: Payer: Self-pay

## 2021-08-02 ENCOUNTER — Ambulatory Visit (INDEPENDENT_AMBULATORY_CARE_PROVIDER_SITE_OTHER): Payer: Medicare Other | Admitting: Family Medicine

## 2021-08-02 ENCOUNTER — Encounter: Payer: Self-pay | Admitting: Family Medicine

## 2021-08-02 DIAGNOSIS — Z720 Tobacco use: Secondary | ICD-10-CM

## 2021-08-02 DIAGNOSIS — J410 Simple chronic bronchitis: Secondary | ICD-10-CM | POA: Diagnosis not present

## 2021-08-02 MED ORDER — ASPIRIN EC 81 MG PO TBEC: 81.0000 mg | DELAYED_RELEASE_TABLET | Freq: Every day | ORAL | 11 refills | Status: DC

## 2021-08-02 MED ORDER — NICOTINE 21 MG/24HR TD PT24: 21.0000 mg | MEDICATED_PATCH | Freq: Every day | TRANSDERMAL | 0 refills | Status: DC

## 2021-08-02 NOTE — Patient Instructions (Addendum)
Thank you for coming to see me today. It was a pleasure. Today we discussed reducing smoking. I recommend nicotine patches daily.  Continue taking the inhaler You need to take aspirin 81mg  as you have a history of stroke.   Make sure you get your shingles and tdap vaccines at the pharmacy  Please follow-up with Dr Valentina Lucks in 2-4 weeks for help stopping smoking   If you have any questions or concerns, please do not hesitate to call the office at [phone].  Best wishes,   Dr Posey Pronto

## 2021-08-03 NOTE — Assessment & Plan Note (Signed)
Discussed smoking cessation. Pt is interested in quitting and is in interested in patches. Prescribed 21mg  nicotine daily. Booked f/u with Dr Valentina Lucks for further management on 08/10/21.

## 2021-08-03 NOTE — Assessment & Plan Note (Addendum)
Stable. This was addressed at previous visit. Discussed smoking cessation. See below. Continue Anoro Ellipta. Lung cancer screening up to date.

## 2021-08-07 ENCOUNTER — Other Ambulatory Visit: Payer: Self-pay | Admitting: Neurology

## 2021-08-10 ENCOUNTER — Other Ambulatory Visit: Payer: Self-pay

## 2021-08-10 ENCOUNTER — Ambulatory Visit (INDEPENDENT_AMBULATORY_CARE_PROVIDER_SITE_OTHER): Payer: Medicare Other | Admitting: Family Medicine

## 2021-08-10 ENCOUNTER — Encounter: Payer: Self-pay | Admitting: Family Medicine

## 2021-08-10 VITALS — BP 146/84 | Ht 69.0 in | Wt 213.2 lb

## 2021-08-10 DIAGNOSIS — R252 Cramp and spasm: Secondary | ICD-10-CM | POA: Diagnosis not present

## 2021-08-10 NOTE — Patient Instructions (Signed)
Thank you for coming to see me today. It was a pleasure. Today we discussed your muscles cramps. Glad to know they are not bothering you too much. I recommend gentle stretching and activity daily.  We will get some labs today.  If they are abnormal or we need to do something about them, I will call you.  If they are normal, I will send you a message on MyChart (if it is active) or a letter in the mail.  If you don't hear from [place] in 2 weeks, please call the office at the number below.   Can take tylenol, honey, lemon drinks, fluids for your cold symptoms  Please follow-up with me as and when you need  If you have any questions or concerns, please do not hesitate to call the office at [phone].  Best wishes,   Dr Posey Pronto

## 2021-08-10 NOTE — Progress Notes (Signed)
° ° ° °  SUBJECTIVE:   CHIEF COMPLAINT / HPI:   Stephen Buckley is a 71 y.o. male presents for muscle cramps  Muscle cramps Pt reports upper and lower extremity muscle cramps. Also present in the neck. Denies injuries at home. Has not tried anything at home for the cramps. Does not occur every day, maybe once a week. He is not concerned about it but wanted to let me know as I am his PCP. Occurs more during the day then at night. No recent changes in meds.  [place] Office Visit from 08/10/2021 in [facility]  PHQ-9 Total Score 4       Health Maintenance Due  Topic   Zoster Vaccines- Shingrix (1 of 2)   TETANUS/TDAP    PERTINENT  PMH / PSH: tobacco use, CVA, HTN   OBJECTIVE:   BP (!) 146/84    Ht 5\' 9"  (1.753 m)    Wt 213 lb 3.2 oz (96.7 kg)    BMI 31.48 kg/m    General: Alert, no acute distress Cardio: well perfused  Pulm:normal work of breathing  Extremities: No peripheral edema.  Neuro: Cranial nerves grossly intact   ASSESSMENT/PLAN:   Muscle cramps Generalized muscle aches. Ddx statin side effect, sedentary life style, electrolyte abnormality. Pt currently asymptomatic. Obtained CMP, Mg and CK to rule out electrolyte abnormalities. Recommended gentle activity and muscle stretches. Will contact pt if there are any lab abnormalities. Could consider switching to alternative statin in the future if sx persist.  Follow up as needed.    Lattie Haw, MD PGY-3 [facility]

## 2021-08-11 ENCOUNTER — Other Ambulatory Visit: Payer: Self-pay | Admitting: Neurology

## 2021-08-11 LAB — COMPREHENSIVE METABOLIC PANEL
ALT: 16 IU/L (ref 0–44)
AST: 16 IU/L (ref 0–40)
Albumin/Globulin Ratio: 2.2 (ref 1.2–2.2)
Albumin: 4.9 g/dL — ABNORMAL HIGH (ref 3.8–4.8)
Alkaline Phosphatase: 83 IU/L (ref 44–121)
BUN/Creatinine Ratio: 8 — ABNORMAL LOW (ref 10–24)
BUN: 6 mg/dL — ABNORMAL LOW (ref 8–27)
Bilirubin Total: <0.2 mg/dL (ref 0.0–1.2)
CO2: 21 mmol/L (ref 20–29)
Calcium: 9.2 mg/dL (ref 8.6–10.2)
Chloride: 110 mmol/L — ABNORMAL HIGH (ref 96–106)
Creatinine, Ser: 0.78 mg/dL (ref 0.76–1.27)
Globulin, Total: 2.2 g/dL (ref 1.5–4.5)
Glucose: 85 mg/dL (ref 70–99)
Potassium: 4.4 mmol/L (ref 3.5–5.2)
Sodium: 146 mmol/L — ABNORMAL HIGH (ref 134–144)
Total Protein: 7.1 g/dL (ref 6.0–8.5)
eGFR: 97 mL/min/{1.73_m2} (ref >59)

## 2021-08-11 LAB — MAGNESIUM: Magnesium: 2.3 mg/dL (ref 1.6–2.3)

## 2021-08-11 NOTE — Assessment & Plan Note (Signed)
Generalized muscle aches. Ddx statin side effect, sedentary life style, electrolyte abnormality. Pt currently asymptomatic. Obtained CMP, Mg and CK to rule out electrolyte abnormalities. Recommended gentle activity and muscle stretches. Will contact pt if there are any lab abnormalities. Could consider switching to alternative statin in the future if sx persist.  Follow up as needed.

## 2021-08-12 LAB — SPECIMEN STATUS REPORT

## 2021-08-13 ENCOUNTER — Other Ambulatory Visit: Payer: Self-pay | Admitting: Family Medicine

## 2021-08-13 DIAGNOSIS — N401 Enlarged prostate with lower urinary tract symptoms: Secondary | ICD-10-CM

## 2021-08-13 DIAGNOSIS — R3911 Hesitancy of micturition: Secondary | ICD-10-CM

## 2021-08-16 ENCOUNTER — Telehealth (HOSPITAL_BASED_OUTPATIENT_CLINIC_OR_DEPARTMENT_OTHER): Payer: Medicare Other | Admitting: Psychiatry

## 2021-08-16 ENCOUNTER — Other Ambulatory Visit: Payer: Self-pay

## 2021-08-16 ENCOUNTER — Encounter (HOSPITAL_COMMUNITY): Payer: Self-pay | Admitting: Psychiatry

## 2021-08-16 ENCOUNTER — Ambulatory Visit (INDEPENDENT_AMBULATORY_CARE_PROVIDER_SITE_OTHER): Payer: Medicare Other

## 2021-08-16 DIAGNOSIS — Z0000 Encounter for general adult medical examination without abnormal findings: Secondary | ICD-10-CM | POA: Diagnosis not present

## 2021-08-16 DIAGNOSIS — F319 Bipolar disorder, unspecified: Secondary | ICD-10-CM | POA: Diagnosis not present

## 2021-08-16 DIAGNOSIS — F09 Unspecified mental disorder due to known physiological condition: Secondary | ICD-10-CM | POA: Diagnosis not present

## 2021-08-16 MED ORDER — CARBAMAZEPINE 200 MG PO TABS: ORAL_TABLET | ORAL | 2 refills | Status: DC

## 2021-08-16 MED ORDER — QUETIAPINE FUMARATE 200 MG PO TABS: 200.0000 mg | ORAL_TABLET | Freq: Every day | ORAL | 2 refills | Status: DC
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
## 2021-08-17 ENCOUNTER — Other Ambulatory Visit: Payer: Self-pay | Admitting: Family Medicine

## 2021-08-18 ENCOUNTER — Other Ambulatory Visit: Payer: Self-pay

## 2021-08-18 DIAGNOSIS — E785 Hyperlipidemia, unspecified: Secondary | ICD-10-CM

## 2021-08-18 MED ORDER — ATORVASTATIN CALCIUM 40 MG PO TABS: 40.0000 mg | ORAL_TABLET | Freq: Every day | ORAL | 0 refills | Status: DC

## 2021-08-22 ENCOUNTER — Ambulatory Visit: Payer: Medicare Other | Admitting: Pharmacist

## 2021-08-22 NOTE — Progress Notes (Signed)
I have reviewed this visit and agree with the documentation.   

## 2021-08-22 NOTE — Patient Instructions (Signed)
You spoke to Stephen Buckley, Stephen Buckley over the phone for your annual wellness visit.  We discussed goals:   Goals       Exercise 2x per week (60 min per time)      Quit smoking / using tobacco (pt-stated)      Patient smokes 3/4 PPD.        We also discussed recommended health maintenance. Please call our office and schedule a visit. As discussed, you are due for: Health Maintenance  Topic Date Due   Zoster Vaccines- Shingrix (2 of 2) 06/12/2021   TETANUS/TDAP  07/10/2021   COLONOSCOPY (Pts 45-77yrs Insurance coverage will need to be confirmed)  03/23/2022   Pneumonia Vaccine 72+ Years old  Completed   INFLUENZA VACCINE  Completed   COVID-19 Vaccine  Completed   Hepatitis C Screening  Completed   HPV VACCINES  Aged Out   Pharmacy apt scheduled for 3/2. #2 Shingles vaccine due- get this at the pharmacy you got the first one.  Tetanus vaccine due- get this at the pharmacy as well.   We also discussed smoking cessation.  1-800-QUIT-NOW Preventive Care 87 Years and Older, Male Preventive care refers to lifestyle choices and visits with your health care provider that can promote health and wellness. Preventive care visits are also called wellness exams. What can I expect for my preventive care visit? Counseling During your preventive care visit, your health care provider may ask about your: Medical history, including: Past medical problems. Family medical history. History of falls. Current health, including: Emotional well-being. Home life and relationship well-being. Sexual activity. Memory and ability to understand (cognition). Lifestyle, including: Alcohol, nicotine or tobacco, and drug use. Access to firearms. Diet, exercise, and sleep habits. Work and work Statistician. Sunscreen use. Safety issues such as seatbelt and bike helmet use. Physical exam Your health care provider will check your: Height and weight. These may be used to calculate your BMI (body mass index).  BMI is a measurement that tells if you are at a healthy weight. Waist circumference. This measures the distance around your waistline. This measurement also tells if you are at a healthy weight and may help predict your risk of certain diseases, such as type 2 diabetes and high blood pressure. Heart rate and blood pressure. Body temperature. Skin for abnormal spots. What immunizations do I need? Vaccines are usually given at various ages, according to a schedule. Your health care provider will recommend vaccines for you based on your age, medical history, and lifestyle or other factors, such as travel or where you work. What tests do I need? Screening Your health care provider may recommend screening tests for certain conditions. This may include: Lipid and cholesterol levels. Diabetes screening. This is done by checking your blood sugar (glucose) after you have not eaten for a while (fasting). Hepatitis C test. Hepatitis B test. HIV (human immunodeficiency virus) test. STI (sexually transmitted infection) testing, if you are at risk. Lung cancer screening. Colorectal cancer screening. Prostate cancer screening. Abdominal aortic aneurysm (AAA) screening. You may need this if you are a current or former smoker. Talk with your health care provider about your test results, treatment options, and if necessary, the need for more tests. Follow these instructions at home: Eating and drinking  Eat a diet that includes fresh fruits and vegetables, whole grains, lean protein, and low-fat dairy products. Limit your intake of foods with high amounts of sugar, saturated fats, and salt. Take vitamin and mineral supplements as recommended by  your health care provider. Do not drink alcohol if your health care provider tells you not to drink. If you drink alcohol: Limit how much you have to 0-2 drinks a day. Know how much alcohol is in your drink. In the [place] one drink equals one 12 oz bottle of beer  (355 mL), one 5 oz glass of wine (148 mL), or one 1 oz glass of hard liquor (44 mL). Lifestyle Brush your teeth every morning and night with fluoride toothpaste. Floss one time each day. Exercise for at least 30 minutes 5 or more days each week. Do not use any products that contain nicotine or tobacco. These products include cigarettes, chewing tobacco, and vaping devices, such as e-cigarettes. If you need help quitting, ask your health care provider. Do not use drugs. If you are sexually active, practice safe sex. Use a condom or other form of protection to prevent STIs. Take aspirin only as told by your health care provider. Make sure that you understand how much to take and what form to take. Work with your health care provider to find out whether it is safe and beneficial for you to take aspirin daily. Ask your health care provider if you need to take a cholesterol-lowering medicine (statin). Find healthy ways to manage stress, such as: Meditation, yoga, or listening to music. Journaling. Talking to a trusted person. Spending time with friends and family. Safety Always wear your seat belt while driving or riding in a vehicle. Do not drive: If you have been drinking alcohol. Do not ride with someone who has been drinking. When you are tired or distracted. While texting. If you have been using any mind-altering substances or drugs. Wear a helmet and other protective equipment during sports activities. If you have firearms in your house, make sure you follow all gun safety procedures. Minimize exposure to UV radiation to reduce your risk of skin cancer. What's next? Visit your health care provider once a year for an annual wellness visit. Ask your health care provider how often you should have your eyes and teeth checked. Stay up to date on all vaccines. This information is not intended to replace advice given to you by your health care provider. Make sure you discuss any questions you  have with your health care provider. Document Revised: 12/07/2020 Document Reviewed: 12/07/2020 Elsevier Patient Education  2022 [employer] Prevention in the Home, Adult Falls can cause injuries and can happen to people of all ages. There are many things you can do to make your home safe and to help prevent falls. Ask for help when making these changes. What actions can I take to prevent falls? General Instructions Use good lighting in all rooms. Replace any light bulbs that burn out. Turn on the lights in dark areas. Use night-lights. Keep items that you use often in easy-to-reach places. Lower the shelves around your home if needed. Set up your furniture so you have a clear path. Avoid moving your furniture around. Do not have throw rugs or other things on the floor that can make you trip. Avoid walking on wet floors. If any of your floors are uneven, fix them. Add color or contrast paint or tape to clearly mark and help you see: Grab bars or handrails. First and last steps of staircases. Where the edge of each step is. If you use a stepladder: Make sure that it is fully opened. Do not climb a closed stepladder. Make sure the sides of the stepladder  are locked in place. Ask someone to hold the stepladder while you use it. Know where your pets are when moving through your home. What can I do in the bathroom?   Keep the floor dry. Clean up any water on the floor right away. Remove soap buildup in the tub or shower. Use nonskid mats or decals on the floor of the tub or shower. Attach bath mats securely with double-sided, nonslip rug tape. If you need to sit down in the shower, use a plastic, nonslip stool. Install grab bars by the toilet and in the tub and shower. Do not use towel bars as grab bars. What can I do in the bedroom? Make sure that you have a light by your bed that is easy to reach. Do not use any sheets or blankets for your bed that hang to the floor. Have a  firm chair with side arms that you can use for support when you get dressed. What can I do in the kitchen? Clean up any spills right away. If you need to reach something above you, use a step stool with a grab bar. Keep electrical cords out of the way. Do not use floor polish or wax that makes floors slippery. What can I do with my stairs? Do not leave any items on the stairs. Make sure that you have a light switch at the top and the bottom of the stairs. Make sure that there are handrails on both sides of the stairs. Fix handrails that are broken or loose. Install nonslip stair treads on all your stairs. Avoid having throw rugs at the top or bottom of the stairs. Choose a carpet that does not hide the edge of the steps on the stairs. Check carpeting to make sure that it is firmly attached to the stairs. Fix carpet that is loose or worn. What can I do on the outside of my home? Use bright outdoor lighting. Fix the edges of walkways and driveways and fix any cracks. Remove anything that might make you trip as you walk through a door, such as a raised step or threshold. Trim any bushes or trees on paths to your home. Check to see if handrails are loose or broken and that both sides of all steps have handrails. Install guardrails along the edges of any raised decks and porches. Clear paths of anything that can make you trip, such as tools or rocks. Have leaves, snow, or ice cleared regularly. Use sand or salt on paths during winter. Clean up any spills in your garage right away. This includes grease or oil spills. What other actions can I take? Wear shoes that: Have a low heel. Do not wear high heels. Have rubber bottoms. Feel good on your feet and fit well. Are closed at the toe. Do not wear open-toe sandals. Use tools that help you move around if needed. These include: Canes. Walkers. Scooters. Crutches. Review your medicines with your doctor. Some medicines can make you feel  dizzy. This can increase your chance of falling. Ask your doctor what else you can do to help prevent falls. Where to find more information Centers for Disease Control and Prevention, STEADI: http://www.wolf.info/ National Institute on Aging: http://kim-miller.com/ Contact a doctor if: You are afraid of falling at home. You feel weak, drowsy, or dizzy at home. You fall at home. Summary There are many simple things that you can do to make your home safe and to help prevent falls. Ways to make your home safe  include removing things that can make you trip and installing grab bars in the bathroom. Ask for help when making these changes in your home. This information is not intended to replace advice given to you by your health care provider. Make sure you discuss any questions you have with your health care provider. Document Revised: 01/13/2020 Document Reviewed: 01/13/2020 Elsevier Patient Education  2022 [employer].   Our clinic's number is [phone]. Please call with questions or concerns about what we discussed today.

## 2021-08-24 ENCOUNTER — Ambulatory Visit (INDEPENDENT_AMBULATORY_CARE_PROVIDER_SITE_OTHER): Payer: Medicare Other | Admitting: Pharmacist

## 2021-08-24 ENCOUNTER — Other Ambulatory Visit: Payer: Self-pay

## 2021-08-24 ENCOUNTER — Encounter: Payer: Self-pay | Admitting: Pharmacist

## 2021-08-24 DIAGNOSIS — Z720 Tobacco use: Secondary | ICD-10-CM

## 2021-08-24 NOTE — Patient Instructions (Signed)
Nice to see you today! ? ?Our goal is 5 [place] cigars or less per day by March 16. ? ?We will follow up at visit on March 16 at 11:15 am. ?

## 2021-08-24 NOTE — Assessment & Plan Note (Signed)
Tobacco use disorder with moderate nicotine dependence of 45+ years duration in a patient who is excellent candidate for success because of pt's rating of importance and confidence in quitting.    ?- goal is 5 [place] cigars or less per day by follow-up.  ?

## 2021-08-24 NOTE — Progress Notes (Signed)
Reviewed: I agree with Dr. Koval's documentation and management. 

## 2021-08-24 NOTE — Progress Notes (Signed)
? ?  S:  ?Patient arrives in good spirits and ambulating without assistance.   Patient arrives for evaluation/assistance with tobacco dependence.   ?Patient was referred and last seen by Primary Care Provider on 08/02/21.  ? ?Age when started using tobacco on a daily basis 71 YO.  ?Brand smoked [place]. Number of cigarettes/day 15 at max. Estimated nicotine content per cigarette >1 (mg) .  ?Recently switched to [facility]. Number of cigars/day 6-8.  ?Estimated nicotine intake per day ~<15 mg.   ? ?Most recent quit attempt in 66s. Pt reports cocaine use during this time in place of cigarettes. Pt reports 3-4 years of  use. Reports quitting for 4 days approximately 2 months ago. Pt reports he has felt scared to quit smoking but unsure why. ?Longest time ever been tobacco free 4 days. ? ?Medications used in past cessation efforts include: nicotine patch. Pt reports having supply of patches but has not used. Pt reports not using any other assistance.   ? ?Rates IMPORTANCE of quitting tobacco on 1-10 scale of 10. ?Rates CONFIDENCE of quitting tobacco on 1-10 scale of 10. ? ?Most common triggers to use tobacco include; habit ? ?Motivation to quit: overall health, religious reasons ? ?Pt reports that in 2 weeks he would like to be down to [place] cigars/day w/o NRT assistance. At f/u in 2 weeks will discuss potential of tobacco cessation completely. May consider varenicline at f/u. Lung function test may be necessary at future visit.  ? ?A/P: ?Tobacco use disorder with moderate nicotine dependence of 45+ years duration in a patient who is excellent candidate for success because of pt's rating of importance and confidence in quitting.    ?- goal is 5 [place] cigars or less per day by follow-up.  ? ?Pt reports preferring face-to-face support.  Uninterested in telephonic support from 1800 quit line.  ? ?Written information provided.  F/U Rx Clinic Visit  09/07/21. ? Total time in face-to-face counseling 41 minutes.   Patient seen with Earvin Hansen PharmD Candidate. ?. ? ? ?

## 2021-08-30 DIAGNOSIS — T8484XA Pain due to internal orthopedic prosthetic devices, implants and grafts, initial encounter: Secondary | ICD-10-CM | POA: Diagnosis not present

## 2021-08-30 DIAGNOSIS — M79604 Pain in right leg: Secondary | ICD-10-CM | POA: Diagnosis not present

## 2021-09-04 ENCOUNTER — Other Ambulatory Visit: Payer: Self-pay | Admitting: Neurology

## 2021-09-05 ENCOUNTER — Other Ambulatory Visit: Payer: Self-pay | Admitting: Neurology

## 2021-09-06 ENCOUNTER — Ambulatory Visit: Payer: Medicare Other | Attending: Orthopedic Surgery

## 2021-09-06 ENCOUNTER — Other Ambulatory Visit: Payer: Self-pay

## 2021-09-06 DIAGNOSIS — M5442 Lumbago with sciatica, left side: Secondary | ICD-10-CM | POA: Diagnosis not present

## 2021-09-06 DIAGNOSIS — G8929 Other chronic pain: Secondary | ICD-10-CM | POA: Diagnosis not present

## 2021-09-06 DIAGNOSIS — M6281 Muscle weakness (generalized): Secondary | ICD-10-CM | POA: Diagnosis not present

## 2021-09-06 DIAGNOSIS — R262 Difficulty in walking, not elsewhere classified: Secondary | ICD-10-CM

## 2021-09-06 DIAGNOSIS — M79605 Pain in left leg: Secondary | ICD-10-CM | POA: Diagnosis not present

## 2021-09-06 DIAGNOSIS — R252 Cramp and spasm: Secondary | ICD-10-CM
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
## 2021-09-07 ENCOUNTER — Encounter: Payer: Self-pay | Admitting: Pharmacist

## 2021-09-07 ENCOUNTER — Other Ambulatory Visit: Payer: Self-pay

## 2021-09-07 ENCOUNTER — Ambulatory Visit (INDEPENDENT_AMBULATORY_CARE_PROVIDER_SITE_OTHER): Payer: Medicare Other | Admitting: Pharmacist

## 2021-09-07 DIAGNOSIS — Z720 Tobacco use: Secondary | ICD-10-CM | POA: Diagnosis not present

## 2021-09-07 MED ORDER — VARENICLINE TARTRATE 0.5 MG PO TABS: 0.5000 mg | ORAL_TABLET | Freq: Two times a day (BID) | ORAL | 0 refills | Status: DC

## 2021-09-07 NOTE — Progress Notes (Signed)
? ?  S:  ?Patient arrives in good spirits and is ambulating with the assistance of a cane. Patient arrives for a follow-up appointment for tobacco dependence.   ?Patient was last seen by Dr. Valentina Lucks on 08/24/21. ? ?Pt reports smoking approximately a pack a day still. ?Reports his breathing has been good. ? ?Most recent quit attempt when he was in his 85s; quit for 4 days about 2 months ago ?Longest time ever been tobacco free 4 days. ? ?Medications used in past cessation efforts include: nicotine patches NRT. Pt reports he picked up the patches, but only used them once or twice. With patches, he reports he went from smoking 1ppd to ~15 cigs/day. Pt reports tolerating patches well but does not wear them much. ?Pt mentions he used varenicline many years ago and that he thinks it helped him.  ? ?Rates IMPORTANCE of quitting tobacco on 1-10 scale of 10. ?Rates CONFIDENCE of quitting tobacco on 1-10 scale of 10 that he could quit for at least 1 day prior to 09/21/2021. ? ?Most common triggers to use tobacco include; habit. Pt reports that hardest cigarette to quit is first thing in the morning because that is "when everything starts." ? ?Motivation to quit: health ? ?A/P: ?Tobacco use disorder with moderate nicotine dependence of 45+ years duration in a patient who is excellent candidate for success because of his confidence in quitting and rate of importance of quitting. ?-Initiated varenicline 0.5 mg by mouth once daily with food x 1 week days, then 0.5 mg by mouth twice daily with food. Patient counseled on purpose, proper use, and potential adverse effects, including GI upset. Will follow up with patient in 2 weeks to follow up and potentially increase to 1 mg.  ?- Pt reports to help try to quit the morning cigarette, he will hide everything out of sight, especially the cigarettes and ash trays. Pt also reports reading will help distract him and keep him busy. ?Goal: Quit smoking for at least 1 day by March 30 (2 weeks).  Pt reported goal of decreasing by 2 cigarettes each day. ? ?Written information provided.  F/U Rx Clinic Visit  2 weeks on 09/21/21 at 3 pm. ?Total time in face-to-face counseling 22 minutes.  Patient seen with Earvin Hansen PharmD Candidate and Zenaida Deed, PharmD, PGY 1 pharmacy resident.  ?. ?  ?

## 2021-09-07 NOTE — Patient Instructions (Addendum)
Nice to see you today! ? ?Today, we started you on a new medication called varenicline (Chantix) 0.5 mg. You will start by taking 0.5 mg once daily with a meal, and after 7 days, you will increase to 0.5 mg twice daily with meals.  ? ?Your goal is to decrease smoking by 2 cigarettes/day and quit completely by your next appointment. We recommend putting away cigarettes and ashtrays in your house so that you can't see them. You also mentioned reading books to distract you from smoking, which we recommend as well.  ? ?Next appointment: 09/21/2021 at [facility] ?

## 2021-09-07 NOTE — Assessment & Plan Note (Signed)
Tobacco use disorder with moderate nicotine dependence of 45+ years duration in a patient who is excellent candidate for success because of his confidence in quitting and rate of importance of quitting. ?-Initiated varenicline 0.5 mg by mouth once daily with food x 1 week days, then 0.5 mg by mouth twice daily with food. Patient counseled on purpose, proper use, and potential adverse effects, including GI upset. Will follow up with patient in 2 weeks to follow up and potentially increase to 1 mg.  ?- Pt reports to help try to quit the morning cigarette, he will hide everything out of sight, especially the cigarettes and ash trays. Pt also reports reading will help distract him and keep him busy. ?Goal: Quit smoking for at least 1 day by March 30 (2 weeks). Pt reported goal of decreasing by 2 cigarettes each day. ?

## 2021-09-07 NOTE — Progress Notes (Signed)
Reviewed: I agree with Dr. Koval's documentation and management. 

## 2021-09-08 ENCOUNTER — Other Ambulatory Visit: Payer: Self-pay | Admitting: Neurology

## 2021-09-08 ENCOUNTER — Other Ambulatory Visit: Payer: Self-pay | Admitting: Family Medicine

## 2021-09-08 DIAGNOSIS — N401 Enlarged prostate with lower urinary tract symptoms: Secondary | ICD-10-CM
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
## 2021-09-12 ENCOUNTER — Ambulatory Visit: Payer: Medicare Other

## 2021-09-12 ENCOUNTER — Other Ambulatory Visit: Payer: Self-pay

## 2021-09-12 DIAGNOSIS — M79605 Pain in left leg: Secondary | ICD-10-CM | POA: Diagnosis not present

## 2021-09-12 DIAGNOSIS — M6281 Muscle weakness (generalized): Secondary | ICD-10-CM | POA: Diagnosis not present

## 2021-09-12 DIAGNOSIS — G8929 Other chronic pain: Secondary | ICD-10-CM

## 2021-09-12 DIAGNOSIS — R252 Cramp and spasm: Secondary | ICD-10-CM | POA: Diagnosis not present

## 2021-09-12 DIAGNOSIS — M5442 Lumbago with sciatica, left side: Secondary | ICD-10-CM | POA: Diagnosis not present

## 2021-09-12 DIAGNOSIS — R262 Difficulty in walking, not elsewhere classified: Secondary | ICD-10-CM | POA: Diagnosis not present

## 2021-09-15 ENCOUNTER — Other Ambulatory Visit: Payer: Self-pay | Admitting: Neurology

## 2021-09-15 ENCOUNTER — Other Ambulatory Visit: Payer: Self-pay | Admitting: Family Medicine
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
## 2021-09-19 ENCOUNTER — Other Ambulatory Visit: Payer: Self-pay

## 2021-09-19 ENCOUNTER — Ambulatory Visit: Payer: Medicare Other

## 2021-09-19 DIAGNOSIS — R262 Difficulty in walking, not elsewhere classified: Secondary | ICD-10-CM | POA: Diagnosis not present

## 2021-09-19 DIAGNOSIS — M79605 Pain in left leg: Secondary | ICD-10-CM

## 2021-09-19 DIAGNOSIS — R252 Cramp and spasm: Secondary | ICD-10-CM | POA: Diagnosis not present

## 2021-09-19 DIAGNOSIS — M6281 Muscle weakness (generalized): Secondary | ICD-10-CM

## 2021-09-19 DIAGNOSIS — G8929 Other chronic pain: Secondary | ICD-10-CM | POA: Diagnosis not present

## 2021-09-19 DIAGNOSIS — M5442 Lumbago with sciatica, left side: Secondary | ICD-10-CM | POA: Diagnosis not present

## 2021-09-21 ENCOUNTER — Encounter: Payer: Self-pay | Admitting: Pharmacist

## 2021-09-21 ENCOUNTER — Ambulatory Visit (INDEPENDENT_AMBULATORY_CARE_PROVIDER_SITE_OTHER): Payer: Medicare Other | Admitting: Pharmacist

## 2021-09-21 VITALS — BP 154/84 | HR 89 | Ht 68.31 in | Wt 212.6 lb

## 2021-09-21 DIAGNOSIS — Z720 Tobacco use: Secondary | ICD-10-CM

## 2021-09-21 DIAGNOSIS — R338 Other retention of urine: Secondary | ICD-10-CM | POA: Diagnosis not present

## 2021-09-21 DIAGNOSIS — N401 Enlarged prostate with lower urinary tract symptoms: Secondary | ICD-10-CM

## 2021-09-21 DIAGNOSIS — E559 Vitamin D deficiency, unspecified: Secondary | ICD-10-CM | POA: Diagnosis not present

## 2021-09-21 NOTE — Progress Notes (Signed)
Reviewed: I agree with Dr. Koval's documentation and management. 

## 2021-09-21 NOTE — Patient Instructions (Addendum)
Nice to see you today! ? ?Today, we started you on a new medication called varenicline (Chantix) 0.5 mg. You will start by taking 0.5 mg once daily with a meal, and after 7 days, you will increase to 0.5 mg twice daily with meals.  ?  ?Your goal is to decrease smoking by 2 cigarettes/day and quit completely by your next appointment. We recommend putting away cigarettes and ashtrays in your house so that you can't see them. You also mentioned reading books to distract you from smoking, which we recommend as well.  ? ?We will follow up with you in 4 weeks.  ?

## 2021-09-21 NOTE — Progress Notes (Signed)
? ?  S:  ?Patient arrives in good spirits with assistance of a cane. Patient arrives for a follow-up appointment for tobacco dependence. Patient was last seen in Pharmacy Clinic on 09/07/21. ? ?Patient reports that he did not start the Chantix (varenicline) that was prescribed at the last visit. He said that he tried to pick it up at the pharmacy twice but they did not have it. He reports that he smoked between 8-10 cigarettes yesterday, but he does not recall his daily average since his last appointment. He states that he meant to hide his ashtrays and cigarettes to help keep his mind off smoking, but he has not done it yet. He reports playing solitaire but says it does not keep his mind off smoking. Patient states that he needs refills for Tamsulosin and Vitamin D. ? ?Most recent quit attempt in his 59s for a few months; quit for 4 days about 2 months ago. ? ?Medications used in past cessation efforts include: NRT with patches. Patient reports he would prefer a pill.  ? ?Rates IMPORTANCE of quitting tobacco on 1-10 scale of 10. ?Rates CONFIDENCE of quitting tobacco on 1-10 scale of 10. ? ?Most common triggers to use tobacco include; habit. His wife smokes but he states that being around someone else smoking does not make him want to smoke.   ? ?Patient reports morning cigarette will be hardest to quit as it is his habit. Patient reports keeping his cigarettes next to his bed and seeing them first thing in the morning. ? ?Motivation to quit: health; help with his coughing, eating ? ?A/P: ?Tobacco use disorder with moderate nicotine dependence of 45+ years duration in a patient who is excellent candidate for success because of his confidence in quitting and rate of importance of quitting. ? -Initiated varenicline 0.5 mg by mouth once daily with food x3 days, then 0.5 mg by mouth twice daily with food. Patient counseled on purpose, proper use, and potential adverse effects, including GI upset. Spoke with pharmacy to  ensure medication is ready for pick up and relayed information to patient. ?- Patient reported hiding ashtrays and cigarettes would still be beneficial for him, which we agreed with. He plans to put his cigarettes in a different room so that he does not want to smoke first thing in the morning.  ?Goal: Quit smoking for at least 1 day by next visit (May 4). Patient reported a goal of decreasing by 2 cigarettes each day. ? ?Benign Prostatic Hyperplasia (BPH): ?- Re-ordered refill for tamsulosin 0.4 mg 2 capsules QHS ? ?Vitamin D Deficiency: ?- Ordered Vitamin D level to be drawn today and discussed at patient's follow up visit with PCP on October 04, 2021  ? ?Written information provided. F/U Rx Clinic Visit in 4 weeks. ? ?Total time in face-to-face counseling 38 minutes. Patient seen with Earvin Hansen PharmD Candidate, Zenaida Deed, PharmD, PGY 1 Pharmacy Resident and Rebbeca Paul, PharmD - PGY2 Pharmacy Resident.  ?

## 2021-09-21 NOTE — Assessment & Plan Note (Signed)
Tobacco use disorder with moderate nicotine dependence of 45+ years duration in a patient who is excellent candidate for success because of his confidence in quitting and rate of importance of quitting. ? -Initiated varenicline 0.5 mg by mouth once daily with food x3 days, then 0.5 mg by mouth twice daily with food. Patient counseled on purpose, proper use, and potential adverse effects, including GI upset. Spoke with pharmacy to ensure medication is ready for pick up and relayed information to patient. ?- Patient reported hiding ashtrays and cigarettes would still be beneficial for him, which we agreed with. He plans to put his cigarettes in a different room so that he does not want to smoke first thing in the morning.  ?Goal: Quit smoking for at least 1 day by next visit (May 4). Patient reported a goal of decreasing by 2 cigarettes each day. ? ?

## 2021-09-21 NOTE — Assessment & Plan Note (Signed)
Benign Prostatic Hyperplasia (BPH): ?- Re-ordered refill for tamsulosin 0.4 mg 2 capsules QHS ?  ?

## 2021-09-22 LAB — VITAMIN D 25 HYDROXY (VIT D DEFICIENCY, FRACTURES): Vit D, 25-Hydroxy: 24 ng/mL — ABNORMAL LOW (ref 30.0–100.0)

## 2021-09-23 ENCOUNTER — Other Ambulatory Visit: Payer: Self-pay | Admitting: Neurology
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
## 2021-09-26 ENCOUNTER — Other Ambulatory Visit: Payer: Self-pay | Admitting: Family Medicine

## 2021-09-26 ENCOUNTER — Telehealth: Payer: Self-pay | Admitting: Family Medicine

## 2021-09-26 ENCOUNTER — Ambulatory Visit: Payer: Medicare Other | Attending: Orthopedic Surgery

## 2021-09-26 DIAGNOSIS — G8929 Other chronic pain: Secondary | ICD-10-CM

## 2021-09-26 DIAGNOSIS — R252 Cramp and spasm: Secondary | ICD-10-CM

## 2021-09-26 DIAGNOSIS — M6281 Muscle weakness (generalized): Secondary | ICD-10-CM | POA: Diagnosis not present

## 2021-09-26 DIAGNOSIS — M79605 Pain in left leg: Secondary | ICD-10-CM

## 2021-09-26 DIAGNOSIS — M5442 Lumbago with sciatica, left side: Secondary | ICD-10-CM | POA: Insufficient documentation

## 2021-09-26 DIAGNOSIS — R262 Difficulty in walking, not elsewhere classified: Secondary | ICD-10-CM

## 2021-09-26 MED ORDER — VITAMIN D3 20 MCG (800 UNIT) PO TABS: 1.0000 | ORAL_TABLET | Freq: Every day | ORAL | 0 refills | Status: DC

## 2021-09-26 NOTE — Telephone Encounter (Signed)
Called pt today regarding most recent vitamin D level, explained that it is low and needs supplementation. Sent in 800u vitamin D3 x 56 tablets to the pharmacy, he should complete an 8 week course.  ?

## 2021-10-02 ENCOUNTER — Other Ambulatory Visit: Payer: Self-pay | Admitting: Family Medicine

## 2021-10-02 ENCOUNTER — Other Ambulatory Visit: Payer: Self-pay | Admitting: Neurology

## 2021-10-02 DIAGNOSIS — J302 Other seasonal allergic rhinitis: Secondary | ICD-10-CM

## 2021-10-03 ENCOUNTER — Ambulatory Visit: Payer: Medicare Other

## 2021-10-03 DIAGNOSIS — R262 Difficulty in walking, not elsewhere classified: Secondary | ICD-10-CM

## 2021-10-03 DIAGNOSIS — M5442 Lumbago with sciatica, left side: Secondary | ICD-10-CM | POA: Diagnosis not present

## 2021-10-03 DIAGNOSIS — R252 Cramp and spasm: Secondary | ICD-10-CM | POA: Diagnosis not present

## 2021-10-03 DIAGNOSIS — M6281 Muscle weakness (generalized): Secondary | ICD-10-CM | POA: Diagnosis not present

## 2021-10-03 DIAGNOSIS — G8929 Other chronic pain: Secondary | ICD-10-CM | POA: Diagnosis not present

## 2021-10-03 DIAGNOSIS — M79605 Pain in left leg: Secondary | ICD-10-CM | POA: Diagnosis not present
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
## 2021-10-04 ENCOUNTER — Ambulatory Visit: Payer: Medicare Other | Admitting: Family Medicine

## 2021-10-09 ENCOUNTER — Ambulatory Visit (INDEPENDENT_AMBULATORY_CARE_PROVIDER_SITE_OTHER): Payer: Medicare Other | Admitting: Family Medicine

## 2021-10-09 ENCOUNTER — Encounter: Payer: Self-pay | Admitting: Family Medicine

## 2021-10-09 VITALS — BP 147/90 | HR 82 | Ht 69.0 in | Wt 208.4 lb

## 2021-10-09 DIAGNOSIS — R32 Unspecified urinary incontinence: Secondary | ICD-10-CM | POA: Diagnosis not present

## 2021-10-09 DIAGNOSIS — R413 Other amnesia: Secondary | ICD-10-CM

## 2021-10-09 LAB — POCT URINALYSIS DIP (MANUAL ENTRY)
Bilirubin, UA: NEGATIVE
Blood, UA: NEGATIVE
Glucose, UA: NEGATIVE mg/dL
Ketones, POC UA: NEGATIVE mg/dL
Leukocytes, UA: NEGATIVE
Nitrite, UA: NEGATIVE
Protein Ur, POC: NEGATIVE mg/dL
Spec Grav, UA: 1.015 (ref 1.010–1.025)
Urobilinogen, UA: 0.2 E.U./dL
pH, UA: 7.5 (ref 5.0–8.0)

## 2021-10-09 NOTE — Patient Instructions (Signed)
Thank you for coming to see me today. It was a pleasure. Today we discussed your memory issues. I recommend coming into the geriatric clinic for further evaluation.  ? ?We will get some labs today.  If they are abnormal or we need to do something about them, I will call you.  If they are normal, I will send you a message on MyChart (if it is active) or a letter in the mail.  If you don't hear from [place] in 2 weeks, please call the office at the number below.  ? ?Please bring your wife with your visits if you can  ? ?If you have any questions or concerns, please do not hesitate to call the office at [phone]. ? ?Best wishes,  ? ?Dr Posey Pronto   ?
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
## 2021-10-10 LAB — CBC
Hematocrit: 41.8 % (ref 37.5–51.0)
Hemoglobin: 14.3 g/dL (ref 13.0–17.7)
MCH: 30.6 pg (ref 26.6–33.0)
MCHC: 34.2 g/dL (ref 31.5–35.7)
MCV: 89 fL (ref 79–97)
Platelets: 178 10*3/uL (ref 150–450)
RBC: 4.68 x10E6/uL (ref 4.14–5.80)
RDW: 12.8 % (ref 11.6–15.4)
WBC: 4 10*3/uL (ref 3.4–10.8)

## 2021-10-10 LAB — HIV ANTIBODY (ROUTINE TESTING W REFLEX): HIV Screen 4th Generation wRfx: NONREACTIVE

## 2021-10-10 LAB — VITAMIN B12: Vitamin B-12: 220 pg/mL — ABNORMAL LOW (ref 232–1245)

## 2021-10-10 LAB — RPR: RPR Ser Ql: NONREACTIVE

## 2021-10-10 LAB — TSH: TSH: 1.33 u[IU]/mL (ref 0.450–4.500)

## 2021-10-10 LAB — FOLATE: Folate: 8.2 ng/mL (ref >3.0)
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
## 2021-10-11 ENCOUNTER — Ambulatory Visit: Payer: Medicare Other

## 2021-10-11 DIAGNOSIS — M6281 Muscle weakness (generalized): Secondary | ICD-10-CM | POA: Diagnosis not present

## 2021-10-11 DIAGNOSIS — R262 Difficulty in walking, not elsewhere classified: Secondary | ICD-10-CM | POA: Diagnosis not present

## 2021-10-11 DIAGNOSIS — R252 Cramp and spasm: Secondary | ICD-10-CM | POA: Diagnosis not present

## 2021-10-11 DIAGNOSIS — M79605 Pain in left leg: Secondary | ICD-10-CM | POA: Diagnosis not present

## 2021-10-11 DIAGNOSIS — G8929 Other chronic pain: Secondary | ICD-10-CM | POA: Diagnosis not present

## 2021-10-11 DIAGNOSIS — M5442 Lumbago with sciatica, left side: Secondary | ICD-10-CM | POA: Diagnosis not present

## 2021-10-12 NOTE — Assessment & Plan Note (Addendum)
Memory progressively declining likely 2/2 TBI but cannot rule out superimposed dementia. Memory seems to be short term, pt often asks me the same questions repeatedly. Low suspicion for delirium given chronicity of sx.  Ordered CBC, TSH, B12, folate, HIV and RPR. Referred to geriatric clinic for further work up. Recommended that it would be very useful if pt's wife could attend subsequent medical appointments. She was in agreement. ?

## 2021-10-13 ENCOUNTER — Other Ambulatory Visit: Payer: Self-pay | Admitting: Family Medicine

## 2021-10-13 ENCOUNTER — Other Ambulatory Visit: Payer: Self-pay | Admitting: Neurology

## 2021-10-13 DIAGNOSIS — Z720 Tobacco use: Secondary | ICD-10-CM

## 2021-10-16 ENCOUNTER — Other Ambulatory Visit: Payer: Self-pay | Admitting: Neurology

## 2021-10-16 ENCOUNTER — Other Ambulatory Visit: Payer: Self-pay | Admitting: Family Medicine

## 2021-10-16 DIAGNOSIS — Z720 Tobacco use: Secondary | ICD-10-CM
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
## 2021-10-18 ENCOUNTER — Ambulatory Visit: Payer: Medicare Other

## 2021-10-18 DIAGNOSIS — G8929 Other chronic pain: Secondary | ICD-10-CM | POA: Diagnosis not present

## 2021-10-18 DIAGNOSIS — R252 Cramp and spasm: Secondary | ICD-10-CM | POA: Diagnosis not present

## 2021-10-18 DIAGNOSIS — M79604 Pain in right leg: Secondary | ICD-10-CM | POA: Diagnosis not present

## 2021-10-18 DIAGNOSIS — M5442 Lumbago with sciatica, left side: Secondary | ICD-10-CM | POA: Diagnosis not present

## 2021-10-18 DIAGNOSIS — R262 Difficulty in walking, not elsewhere classified: Secondary | ICD-10-CM | POA: Diagnosis not present

## 2021-10-18 DIAGNOSIS — M6281 Muscle weakness (generalized): Secondary | ICD-10-CM | POA: Diagnosis not present

## 2021-10-18 DIAGNOSIS — M79605 Pain in left leg: Secondary | ICD-10-CM | POA: Diagnosis not present

## 2021-10-18 DIAGNOSIS — T8484XD Pain due to internal orthopedic prosthetic devices, implants and grafts, subsequent encounter: Secondary | ICD-10-CM | POA: Diagnosis not present
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
## 2021-10-25 ENCOUNTER — Ambulatory Visit: Payer: Medicare Other | Attending: Orthopedic Surgery

## 2021-10-25 ENCOUNTER — Other Ambulatory Visit: Payer: Self-pay | Admitting: Family Medicine

## 2021-10-25 DIAGNOSIS — R262 Difficulty in walking, not elsewhere classified: Secondary | ICD-10-CM | POA: Diagnosis not present

## 2021-10-25 DIAGNOSIS — Z720 Tobacco use: Secondary | ICD-10-CM

## 2021-10-25 DIAGNOSIS — M5442 Lumbago with sciatica, left side: Secondary | ICD-10-CM | POA: Diagnosis not present

## 2021-10-25 DIAGNOSIS — G8929 Other chronic pain: Secondary | ICD-10-CM | POA: Diagnosis not present

## 2021-10-25 DIAGNOSIS — M6281 Muscle weakness (generalized): Secondary | ICD-10-CM | POA: Insufficient documentation

## 2021-10-25 MED ORDER — VARENICLINE TARTRATE 1 MG PO TABS
1.0000 mg | ORAL_TABLET | Freq: Two times a day (BID) | ORAL | 0 refills | Status: DC
Start: 2021-10-25 — End: 2021-11-24

## 2021-10-26 ENCOUNTER — Encounter: Payer: Self-pay | Admitting: Pharmacist

## 2021-10-26 ENCOUNTER — Ambulatory Visit (INDEPENDENT_AMBULATORY_CARE_PROVIDER_SITE_OTHER): Payer: Medicare Other | Admitting: Pharmacist

## 2021-10-26 DIAGNOSIS — Z720 Tobacco use: Secondary | ICD-10-CM | POA: Diagnosis not present

## 2021-10-26 MED ORDER — VITAMIN D 50 MCG (2000 UT) PO CAPS
2000.0000 [IU] | ORAL_CAPSULE | Freq: Every day | ORAL | Status: DC
Start: 2021-10-26 — End: 2022-01-15

## 2021-10-26 NOTE — Progress Notes (Signed)
? ?  S:  ?Patient arrives in good spirits. Patient arrives for evaluation/assistance with tobacco dependence.   ?Patient was last seen by pharmacy clinic 09/21/21.  ? ?Patient reports that he has not started taking Chantix (varenicline) because he didn't know if he could take it at the same time as his other medications. He has the medication on hand (he brought all his medications in today) but has not been taking it. He reports that he got down to 10 cigarettes per day (down from 20) but that he has gone back up. He is nervous to make the change, says he "is being tested by God."  ? ?Most recent quit attempt in his 34s for a few months; quit for 4 days about 2 months ago. ?  ?Medications used in past cessation efforts include: NRT with patches. Patient reports he would prefer a pill.  ? ?Most common triggers to use tobacco include: habit, stress ? ?Motivation to quit: health, improving breathing ? ?Clinical ASCVD: Yes  ?The ASCVD Risk score (Arnett DK, et al., 2019) failed to calculate for the following reasons: ?  The patient has a prior MI or stroke diagnosis ? ?A/P: ?Tobacco use disorder with moderate nicotine dependence of 45+ years duration in a patient who is excellent candidate for success because of his confidence in quitting and rate of importance of quitting. ?- Initiated varenicline 0.5 mg by mouth once daily with food x7 days, then increase to 0.5 mg by mouth twice daily with food. Patient counseled on purpose, proper use, and potential adverse effects, including GI upset.  ?Goal: to completely quit smoking by next visit.  ? ?Vitamin D deficiency: last vitamin D level on 09/21/21 was 24.  ?- Start taking vitamin D 2000 IU daily.  ? ?Written information provided. F/U phone call in 2 weeks. F/U Rx Clinic Visit in 1 month.  ?Total time in face-to-face counseling 30 minutes.  Patient seen with Rebbeca Paul, PharmD, PGY2 Pharmacy Resident. ?

## 2021-10-26 NOTE — Patient Instructions (Addendum)
Start taking Chantix (varenicline) - 1 white pill once daily for 7 days then increase to 1 white pill TWICE daily for 7 days.  ? ?Then switch to the blue pill (1 mg) twice daily.  ? ?Always take it WITH FOOD.  ? ?Call to schedule an appointment for the week of June 5th.  ? ?Buy some vitamin D over the counter at the pharmacy: start taking 2000 units daily.  ? ?

## 2021-10-26 NOTE — Assessment & Plan Note (Signed)
Tobacco use disorder with moderate nicotine dependence of 45+ years duration in a patient who is excellent candidate for success because of his confidence in quitting and rate of importance of quitting. ?- Initiated varenicline 0.5 mg by mouth once daily with food x7 days, then increase to 0.5 mg by mouth twice daily with food. Patient counseled on purpose, proper use, and potential adverse effects, including GI upset.  ?Goal: to completely quit smoking by next visit.  ? ?

## 2021-10-26 NOTE — Progress Notes (Signed)
Reviewed: I agree with Dr. Koval's documentation and management. 
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
## 2021-11-01 ENCOUNTER — Ambulatory Visit: Payer: Medicare Other

## 2021-11-01 ENCOUNTER — Other Ambulatory Visit: Payer: Self-pay

## 2021-11-01 DIAGNOSIS — M5442 Lumbago with sciatica, left side: Secondary | ICD-10-CM | POA: Diagnosis not present

## 2021-11-01 DIAGNOSIS — R262 Difficulty in walking, not elsewhere classified: Secondary | ICD-10-CM

## 2021-11-01 DIAGNOSIS — G8929 Other chronic pain: Secondary | ICD-10-CM | POA: Diagnosis not present

## 2021-11-01 DIAGNOSIS — M6281 Muscle weakness (generalized): Secondary | ICD-10-CM | POA: Diagnosis not present

## 2021-11-09 ENCOUNTER — Encounter (HOSPITAL_COMMUNITY): Payer: Self-pay | Admitting: Psychiatry

## 2021-11-09 ENCOUNTER — Telehealth (HOSPITAL_BASED_OUTPATIENT_CLINIC_OR_DEPARTMENT_OTHER): Payer: Medicare Other | Admitting: Psychiatry

## 2021-11-09 DIAGNOSIS — F09 Unspecified mental disorder due to known physiological condition: Secondary | ICD-10-CM

## 2021-11-09 DIAGNOSIS — F319 Bipolar disorder, unspecified: Secondary | ICD-10-CM | POA: Diagnosis not present

## 2021-11-09 MED ORDER — CARBAMAZEPINE 200 MG PO TABS: ORAL_TABLET | ORAL | 5 refills | Status: DC

## 2021-11-09 MED ORDER — QUETIAPINE FUMARATE 200 MG PO TABS: 200.0000 mg | ORAL_TABLET | Freq: Every day | ORAL | 5 refills | Status: DC
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
## 2021-11-12 ENCOUNTER — Other Ambulatory Visit: Payer: Self-pay | Admitting: Family Medicine

## 2021-11-12 DIAGNOSIS — Z720 Tobacco use: Secondary | ICD-10-CM

## 2021-11-13 ENCOUNTER — Telehealth (HOSPITAL_COMMUNITY): Payer: Medicare Other | Admitting: Psychiatry

## 2021-11-18 ENCOUNTER — Other Ambulatory Visit (HOSPITAL_COMMUNITY): Payer: Self-pay | Admitting: Psychiatry

## 2021-11-18 ENCOUNTER — Other Ambulatory Visit: Payer: Self-pay | Admitting: Family Medicine

## 2021-11-18 DIAGNOSIS — E785 Hyperlipidemia, unspecified: Secondary | ICD-10-CM

## 2021-11-18 DIAGNOSIS — N401 Enlarged prostate with lower urinary tract symptoms: Secondary | ICD-10-CM

## 2021-11-18 DIAGNOSIS — F09 Unspecified mental disorder due to known physiological condition: Secondary | ICD-10-CM

## 2021-11-18 DIAGNOSIS — F319 Bipolar disorder, unspecified: Secondary | ICD-10-CM

## 2021-11-24 ENCOUNTER — Other Ambulatory Visit: Payer: Self-pay | Admitting: *Deleted

## 2021-11-24 ENCOUNTER — Other Ambulatory Visit: Payer: Self-pay | Admitting: Neurology

## 2021-11-25 MED ORDER — VARENICLINE TARTRATE 1 MG PO TABS: 1.0000 mg | ORAL_TABLET | Freq: Two times a day (BID) | ORAL | 0 refills | Status: DC

## 2021-11-27 ENCOUNTER — Ambulatory Visit (INDEPENDENT_AMBULATORY_CARE_PROVIDER_SITE_OTHER): Payer: Medicare Other | Admitting: Pharmacist

## 2021-11-27 ENCOUNTER — Encounter: Payer: Self-pay | Admitting: Pharmacist

## 2021-11-27 DIAGNOSIS — Z720 Tobacco use: Secondary | ICD-10-CM

## 2021-11-27 MED ORDER — VARENICLINE TARTRATE 1 MG PO TABS: 1.0000 mg | ORAL_TABLET | Freq: Two times a day (BID) | ORAL | 0 refills | Status: DC

## 2021-11-27 NOTE — Assessment & Plan Note (Signed)
Tobacco use disorder with moderate nicotine dependence of 45+ years duration in a patient who is fair candidate for success because of continued challenges with complete abstinence.  Patient continues to struggle with reduction lower than 2 cigarettes per day.  He denies smoking more than 10 in any day in recent memory.   - Continue 1 mg by mouth twice daily with food thereafter. Patient counseled on purpose, proper use, and potential adverse effects, including GI upset.

## 2021-11-27 NOTE — Patient Instructions (Addendum)
Nice to see you today.   Please start taking VARENICLINE '1mg'$  twice daily with Food  Please continue to avoid smoking due to stress and habit.  STOP buying cigarettes - saving you money!  I will plan to call you before the end of June.

## 2021-11-27 NOTE — Progress Notes (Signed)
Reviewed: I agree with Dr. Koval's documentation and management. 

## 2021-11-27 NOTE — Telephone Encounter (Signed)
Verify Drug Registry For Lorazepam 0.5 Mg Tablet Last Filled: 10/25/2021 Quantity: 60 tablets for 30 days Last appointment: 05/17/2021 Next appointment: 05/21/2022

## 2021-11-27 NOTE — Telephone Encounter (Signed)
He does need Vit D supplement, only the Lorazepam. Can you please cancel it. Thanks

## 2021-11-27 NOTE — Progress Notes (Signed)
   S:  Patient arrives in good spirits and ambulating with assistance of a cane.   Patient arrives for evaluation/assistance with tobacco dependence.   Patient was referred and last seen by Primary Care Provider, Dr. Posey Pronto on 10/09/2021.  Last seen in Pharmacy Clinic - 10/26/2021.   Estimated nicotine intake per day '10mg'$ .    Most recent quit attempt within the last month has been minimally successful.  Longest time ever been tobacco free while incarcerated.  Denies ever quitting for 1 week in the past.   Rates IMPORTANCE of quitting tobacco on 1-10 scale of 10. Rates CONFIDENCE of quitting tobacco on 1-10 scale of 5.  Most common triggers to use tobacco include; habit - buying the "cheapest brand they have".    Patient expressed concern with quitting and his attitude. Specifically, he was concerned with his agitation toward his wife.  Patient has started leaving the house to avoid conflict with his wife.  He believes conflict avoidance is helping.    Motivation to quit: Breathing.   Clinical ASCVD: Yes  - Hx of Stroke  Review of Systems  All other systems reviewed and are negative.  Physical Exam Vitals reviewed.  Pulmonary:     Effort: Pulmonary effort is normal.  Neurological:     Mental Status: He is alert.  Psychiatric:        Mood and Affect: Mood normal.        Thought Content: Thought content normal.     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  Patient continues to struggle with reduction lower than 2 cigarettes per day.  He denies smoking more than 10 in any day in recent memory.   - Continue 1 mg by mouth twice daily with food thereafter. Patient counseled on purpose, proper use, and potential adverse effects, including GI upset.   Written information provided.  F/U phone call 3 weeks.   Total time in face-to-face  counseling 25 minutes.  Patient seen with Berdie Ogren, PharmD Candidate.

## 2021-11-28 ENCOUNTER — Encounter: Payer: Self-pay | Admitting: *Deleted

## 2021-12-19 ENCOUNTER — Other Ambulatory Visit: Payer: Self-pay | Admitting: Family Medicine

## 2021-12-20 ENCOUNTER — Ambulatory Visit (INDEPENDENT_AMBULATORY_CARE_PROVIDER_SITE_OTHER): Payer: Medicare Other | Admitting: Family Medicine

## 2021-12-20 ENCOUNTER — Encounter: Payer: Self-pay | Admitting: Family Medicine

## 2021-12-20 ENCOUNTER — Other Ambulatory Visit: Payer: Self-pay | Admitting: Family Medicine

## 2021-12-20 DIAGNOSIS — G44209 Tension-type headache, unspecified, not intractable: Secondary | ICD-10-CM

## 2021-12-20 NOTE — Progress Notes (Signed)
     SUBJECTIVE:   CHIEF COMPLAINT / HPI:   Stephen Buckley is a 71 y.o. male presents for headache  Wife present at visit today.  Headache  Currently asymptomatic.  Onset: 1-2 weeks Location: occipital Quality: dull  Frequency: intermittent  Precipitating factors: laying down  Prior treatment: no  Associated Symptoms Nausea/vomiting: no  Photophobia/phonophobia: no  Tearing of eyes: no  Sinus pain/pressure: no  Family hx migraine: no  Personal stressors: no   Red Flags Fever: no  Neck pain/stiffness: no  Vision/speech/swallow/hearing difficulty: no Focal weakness/numbness: no  Altered mental status: no  Trauma: no  New type of headache: yes  Anticoagulant use: no  H/o cancer/HIV/Pregnancy: no      Flowsheet Row Office Visit from 12/20/2021 in [facility]  PHQ-9 Total Score 5       PERTINENT  PMH / PSH:   OBJECTIVE:   BP 134/87   Pulse 90   Ht 5' 8.5" (1.74 m)   Wt 195 lb (88.5 kg)   BMI 29.22 kg/m    General: Alert, no acute distress Cardio: Normal S1 and S2, RRR, no r/m/g Pulm: CTAB, normal work of breathing Abdomen: Bowel sounds normal. Abdomen soft and non-tender.  Extremities: No peripheral edema.  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 No red flags. Low suspicion for subarachnoid hemorrhoid, subdural hemorrhage or stroke. Pt is asymptomatic today and normal cranial nerve exam today which is reassuring. Recommended conservative management for headache: tylenol '650mg'$  every 4-6 hrs for headache. If no improvement in sx follow up with PCP. Strict ER precautions given to pt.    Lattie Haw, MD PGY-3 [facility]

## 2021-12-20 NOTE — Patient Instructions (Signed)
Thank you for coming to see me today. It was a pleasure. Today we discussed your headache, I am glad you do not have a headache today. I recommend Tylenol '650mg'$  every 4-6 hrs  If it worsens or does not improve with tylenol or you have vision changes, vomiting, confusion etc then go to the ER  Please follow-up with me as needed   If you have any questions or concerns, please do not hesitate to call the office at [phone].  Best wishes,   Dr Posey Pronto

## 2021-12-21 ENCOUNTER — Other Ambulatory Visit: Payer: Self-pay | Admitting: Family Medicine

## 2021-12-21 DIAGNOSIS — Z720 Tobacco use: Secondary | ICD-10-CM

## 2021-12-26 DIAGNOSIS — G44209 Tension-type headache, unspecified, not intractable: Secondary | ICD-10-CM | POA: Insufficient documentation

## 2021-12-26 NOTE — Assessment & Plan Note (Addendum)
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 No red flags. Low suspicion for subarachnoid hemorrhoid, subdural hemorrhage or stroke. Pt is asymptomatic today and normal cranial nerve exam today which is reassuring. Recommended conservative management for headache: tylenol '650mg'$  every 4-6 hrs for headache. If no improvement in sx follow up with PCP. Strict ER precautions given to pt.

## 2021-12-28 ENCOUNTER — Other Ambulatory Visit: Payer: Self-pay | Admitting: Family Medicine

## 2021-12-28 DIAGNOSIS — J302 Other seasonal allergic rhinitis: Secondary | ICD-10-CM

## 2022-01-09 ENCOUNTER — Other Ambulatory Visit: Payer: Self-pay | Admitting: Neurology

## 2022-01-09 ENCOUNTER — Other Ambulatory Visit: Payer: Self-pay | Admitting: Family Medicine

## 2022-01-09 DIAGNOSIS — N401 Enlarged prostate with lower urinary tract symptoms: Secondary | ICD-10-CM

## 2022-01-09 DIAGNOSIS — Z720 Tobacco use: Secondary | ICD-10-CM

## 2022-01-10 DIAGNOSIS — F39 Unspecified mood [affective] disorder: Secondary | ICD-10-CM | POA: Insufficient documentation

## 2022-01-11 ENCOUNTER — Other Ambulatory Visit: Payer: Self-pay | Admitting: Neurology

## 2022-01-11 ENCOUNTER — Ambulatory Visit (INDEPENDENT_AMBULATORY_CARE_PROVIDER_SITE_OTHER): Payer: Medicare Other | Admitting: Family Medicine

## 2022-01-11 VITALS — BP 132/74 | HR 80 | Ht 68.5 in | Wt 193.0 lb

## 2022-01-11 DIAGNOSIS — E559 Vitamin D deficiency, unspecified: Secondary | ICD-10-CM | POA: Diagnosis not present

## 2022-01-11 DIAGNOSIS — R3911 Hesitancy of micturition: Secondary | ICD-10-CM | POA: Diagnosis not present

## 2022-01-11 DIAGNOSIS — F02818 Dementia in other diseases classified elsewhere, unspecified severity, with other behavioral disturbance: Secondary | ICD-10-CM

## 2022-01-11 DIAGNOSIS — F1911 Other psychoactive substance abuse, in remission: Secondary | ICD-10-CM | POA: Diagnosis not present

## 2022-01-11 DIAGNOSIS — N401 Enlarged prostate with lower urinary tract symptoms: Secondary | ICD-10-CM

## 2022-01-11 DIAGNOSIS — R413 Other amnesia: Secondary | ICD-10-CM

## 2022-01-11 DIAGNOSIS — Z7189 Other specified counseling: Secondary | ICD-10-CM

## 2022-01-11 DIAGNOSIS — H6123 Impacted cerumen, bilateral: Secondary | ICD-10-CM | POA: Diagnosis not present

## 2022-01-11 NOTE — Progress Notes (Signed)
S: Pharmacy consulted to complete medication reconciliation for geriatric clinic. Patient arrives without medication complaint,walking with cane and accompanied his wife. Medication reconciliation completed by patient, med supply and wife.   Medication Issues Identified: 1. no supply of lorazepam 2. Requested refill for tamsulosin 0.'4mg'$  daily 3. Consider refill of Vitamin D 200 IU daily   Plan: 1. Defer the above to PCP   Patient seen with: Joseph Art, PharmD, PGY2 Pharmacy Resident.

## 2022-01-11 NOTE — Patient Instructions (Signed)
Please come back in 6 weeks to discuss your Financial Power of [place], and [place].

## 2022-01-15 ENCOUNTER — Encounter: Payer: Self-pay | Admitting: Family Medicine

## 2022-01-15 DIAGNOSIS — Z0001 Encounter for general adult medical examination with abnormal findings: Secondary | ICD-10-CM | POA: Insufficient documentation

## 2022-01-15 DIAGNOSIS — F1911 Other psychoactive substance abuse, in remission: Secondary | ICD-10-CM | POA: Insufficient documentation

## 2022-01-15 DIAGNOSIS — Z7189 Other specified counseling: Secondary | ICD-10-CM | POA: Insufficient documentation

## 2022-01-15 DIAGNOSIS — H6120 Impacted cerumen, unspecified ear: Secondary | ICD-10-CM | POA: Insufficient documentation

## 2022-01-15 DIAGNOSIS — E559 Vitamin D deficiency, unspecified: Secondary | ICD-10-CM | POA: Insufficient documentation

## 2022-01-15 DIAGNOSIS — H6123 Impacted cerumen, bilateral: Secondary | ICD-10-CM | POA: Insufficient documentation

## 2022-01-15 HISTORY — DX: Other psychoactive substance abuse, in remission: F19.11

## 2022-01-15 MED ORDER — TAMSULOSIN HCL 0.4 MG PO CAPS: 0.4000 mg | ORAL_CAPSULE | Freq: Every day | ORAL | 3 refills | Status: AC

## 2022-01-15 MED ORDER — VITAMIN D 50 MCG (2000 UT) PO CAPS: 2000.0000 [IU] | ORAL_CAPSULE | Freq: Every day | ORAL | 2 refills | Status: DC
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
## 2022-01-15 NOTE — Assessment & Plan Note (Signed)
New problem Patial disimpaction of right eac and full disimpaction of left eac using irrigation.   Recommend use of debrox kit then reattempt full disimpaction next office visit.

## 2022-01-15 NOTE — Assessment & Plan Note (Signed)
Established problem. Stable. Continue Vit D 2000 units daily po

## 2022-01-15 NOTE — Assessment & Plan Note (Addendum)
Stephen Buckley is to return in 2-3 weeks to discuss Advanced Care planning of durable POA and HC POA. I believe Stephen Buckley has capacity to make informed decisions about his choice of agents.

## 2022-01-15 NOTE — Assessment & Plan Note (Addendum)
Established problem. Diagnosis established by neuropsychological testing by Dr Halina Andreas 03/10/18. 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  She noted Stephen Buckley has particular deficits in speed of information processing, mental flexibility, semantic retrieval, and verbal memory.  He had retained skills in visuospatial abilities and non-verbal memory.   His behavioral issues have been with anger-outbursts and resistance to assistance with care. He will go along time without bathing.  His mood issues appear to be much improved with use of Tegretol and Seroquel prescribed by Dr A. Syed (Psych).    patient on memantine from [facility].   His dementia stage would be mild given preservation of several iADLs.

## 2022-01-15 NOTE — Assessment & Plan Note (Signed)
Established problem. Stable. Patient is at goal. No signs of complications, medication side effects, or red flags. Continue current medications and other regiments.

## 2022-01-21 ENCOUNTER — Other Ambulatory Visit: Payer: Self-pay | Admitting: Student

## 2022-01-21 ENCOUNTER — Other Ambulatory Visit (HOSPITAL_COMMUNITY): Payer: Self-pay | Admitting: Psychiatry

## 2022-01-21 DIAGNOSIS — Z720 Tobacco use: Secondary | ICD-10-CM

## 2022-01-21 DIAGNOSIS — F09 Unspecified mental disorder due to known physiological condition: Secondary | ICD-10-CM

## 2022-01-21 DIAGNOSIS — F319 Bipolar disorder, unspecified: Secondary | ICD-10-CM

## 2022-01-23 ENCOUNTER — Other Ambulatory Visit: Payer: Self-pay | Admitting: Family Medicine

## 2022-01-23 DIAGNOSIS — Z720 Tobacco use: Secondary | ICD-10-CM

## 2022-01-25 ENCOUNTER — Other Ambulatory Visit: Payer: Self-pay | Admitting: *Deleted

## 2022-01-26 MED ORDER — VARENICLINE TARTRATE 1 MG PO TABS: 1.0000 mg | ORAL_TABLET | Freq: Two times a day (BID) | ORAL | 0 refills | Status: DC

## 2022-02-06 ENCOUNTER — Telehealth: Payer: Self-pay

## 2022-02-06 NOTE — Telephone Encounter (Signed)
Patient calls nurse line requesting returned phone call from new PCP, Dr. Owens Shark.   Attempted to gather more information. Patient states that he wants to speak directly with Dr. Owens Shark.   Forwarding to PCP.   Talbot Grumbling, RN

## 2022-02-08 ENCOUNTER — Ambulatory Visit (INDEPENDENT_AMBULATORY_CARE_PROVIDER_SITE_OTHER): Payer: Medicare HMO | Admitting: Family Medicine

## 2022-02-08 ENCOUNTER — Encounter: Payer: Self-pay | Admitting: Family Medicine

## 2022-02-08 VITALS — BP 124/68 | HR 89 | Ht 68.5 in | Wt 193.0 lb

## 2022-02-08 DIAGNOSIS — H6123 Impacted cerumen, bilateral: Secondary | ICD-10-CM | POA: Diagnosis not present

## 2022-02-08 DIAGNOSIS — Z7189 Other specified counseling: Secondary | ICD-10-CM | POA: Diagnosis not present

## 2022-02-08 NOTE — Patient Instructions (Signed)
   Please take Vitamin D 1000 IU tablet, one tablet daily.

## 2022-02-08 NOTE — Assessment & Plan Note (Signed)
Cerumen obstructions of bilateral EAC cleared with gentle irrigation and cerumen spoon.  No complications from procedure.  Hearing subjectively improved.

## 2022-02-08 NOTE — Progress Notes (Signed)
Stephen Buckley is accompanied by patient and wife, Stephen Buckley Sources of clinical information for visit is/are patient. Nursing assessment for this office visit was reviewed with the patient for accuracy and revision.     Previous Report(s) Reviewed: none     02/08/2022    1:30 PM  Depression screen PHQ 2/9  Decreased Interest 3  Down, Depressed, Hopeless 1  PHQ - 2 Score 4  Tired, decreased energy 0  Change in appetite 1  Feeling bad or failure about yourself  1  Trouble concentrating 1  Moving slowly or fidgety/restless 0  Suicidal thoughts 0  Difficult doing work/chores Not difficult at all   [name] Visit from 02/08/2022 in [name] from 01/11/2022 in [name] from 12/20/2021 in [name]  Thoughts that you would be better off dead, or of hurting yourself in some way Not at all Not at all Not at all  PHQ-9 Total Score -- 2 5          02/08/2022    1:30 PM 01/11/2022    1:42 PM 12/20/2021    3:50 PM 08/16/2021    3:09 PM 08/16/2021    3:08 PM  [place] in the past year? 0 0 0  0  Number falls in past yr:  0   0  Injury with Fall?     0  Risk for fall due to :    Impaired balance/gait;Orthopedic patient Impaired balance/gait  Follow up    Falls prevention discussed Falls prevention discussed       02/08/2022    1:30 PM 01/11/2022    1:43 PM 12/20/2021    3:50 PM  PHQ9 SCORE ONLY  PHQ-9 Total Score '7 2 5    '$ Adult vaccines due  Topic Date Due   TETANUS/TDAP  07/10/2021    Health Maintenance Due  Topic Date Due   COVID-19 Vaccine (6 - Mixed Product risk series) 06/09/2021   Zoster Vaccines- Shingrix (2 of 2) 06/12/2021   TETANUS/TDAP  07/10/2021   INFLUENZA VACCINE  01/23/2022      History/P.E. limitations: none  Adult vaccines due  Topic Date Due   TETANUS/TDAP  07/10/2021   There are no preventive care reminders to display  for this patient.  Health Maintenance Due  Topic Date Due   COVID-19 Vaccine (6 - Mixed Product risk series) 06/09/2021   Zoster Vaccines- Shingrix (2 of 2) 06/12/2021   TETANUS/TDAP  07/10/2021   INFLUENZA VACCINE  01/23/2022     Chief Complaint  Patient presents with   forms   Cerumen Impaction     --------------------------------------------------------------------------------------------------------------------------------------------- Visit Problem List with A/P  Thirty minutes spent in explaining purpose of HC POA agent and purpose of Advanced Directive.  Forms were completed with assistance of myself It is my opinion that Stephen Buckley has the necessary capacities to make an informed decision about [name] POA agent.  The forms are to be notarized and copied as patient check out at [name] front desk. Will need to record in [name]. No problem-specific Assessment & Plan notes found for this encounter.

## 2022-02-21 ENCOUNTER — Telehealth: Payer: Self-pay | Admitting: *Deleted

## 2022-02-21 DIAGNOSIS — S069X9S Unspecified intracranial injury with loss of consciousness of unspecified duration, sequela: Secondary | ICD-10-CM

## 2022-02-21 DIAGNOSIS — F02818 Dementia in other diseases classified elsewhere, unspecified severity, with other behavioral disturbance: Secondary | ICD-10-CM

## 2022-02-21 NOTE — Telephone Encounter (Signed)
Wife called requesting assistance in getting patient placed in an assisted living facility here in [place].  She would like to speak with a Education officer, museum this week if possible.  Referral pended and sent to both PCP and [facility] provider.  Xoie Kreuser,CMA

## 2022-02-22 ENCOUNTER — Telehealth: Payer: Self-pay | Admitting: *Deleted

## 2022-02-22 ENCOUNTER — Other Ambulatory Visit: Payer: Self-pay | Admitting: Student

## 2022-02-22 NOTE — Chronic Care Management (AMB) (Signed)
  Care Coordination  Outreach Note  02/22/2022 Name: Stephen Buckley MRN: [id number] DOB: 1950-07-31   Care Coordination Outreach Attempts  An unsuccessful telephone outreach was attempted today to offer the patient information about available care coordination services as a benefit of their health plan.   Follow Up Plan:  Additional outreach attempts will be made to offer the patient care coordination information and services.   Encounter Outcome:  No Answer   [place]  Direct Dial: [phone]

## 2022-02-22 NOTE — Chronic Care Management (AMB) (Signed)
  Care Coordination   Note   02/22/2022 Name: XAVYER STEENSON MRN: [id number] DOB: 07/03/1950  Marny Lowenstein Lance is a 71 y.o. year old male who sees Orvis Brill, DO for primary care. I reached out to Elspeth Cho by phone today to offer care coordination services.  Mr. Aymond was given information about Care Coordination services today including:   The Care Coordination services include support from the care team which includes your Nurse Coordinator, Clinical Social Worker, or Pharmacist.  The Care Coordination team is here to help remove barriers to the health concerns and goals most important to you. Care Coordination services are voluntary, and the patient may decline or stop services at any time by request to their care team member.   Care Coordination Consent Status: Patient agreed to services and verbal consent obtained.   Follow up plan:  Telephone appointment with care coordination team member scheduled for:  02/23/22  Encounter Outcome:  Pt. Scheduled  [place]  Direct Dial: [phone]

## 2022-02-22 NOTE — Telephone Encounter (Signed)
Make referral to [facility] in comments the referral is to assist with patient placement in ALF.

## 2022-02-23 ENCOUNTER — Other Ambulatory Visit: Payer: Self-pay | Admitting: *Deleted

## 2022-02-23 ENCOUNTER — Ambulatory Visit: Payer: Self-pay | Admitting: Licensed Clinical Social Worker

## 2022-02-23 MED ORDER — FINASTERIDE 5 MG PO TABS
5.0000 mg | ORAL_TABLET | Freq: Every day | ORAL | 0 refills | Status: DC
Start: 2022-02-23 — End: 2022-06-04

## 2022-02-23 NOTE — Patient Outreach (Signed)
  Care Coordination   Initial Visit Note   02/23/2022 Name: CHAYSE ZATARAIN MRN: [id number] DOB: [birth year]  Marny Lowenstein Vandermeulen is a 71 y.o. year old male who sees Orvis Brill, DO for primary care. I spoke with  Elspeth Cho by phone today.  What matters to the patients health and wellness today?  Medicaid Application and ALF    Goals Addressed               This Visit's Progress     MSW Care Coordination (pt-stated)        Patient eligible for Medicaid. SW Referred patient to DSS and Assisted patient with Medicaid application.   Patient wanting to go into a ALF. SW reviewed facilities with patients spouse. Spouse agreed to wait for Medicaid approval or denial before continuing search for ALF. SW dicussed process with family.         SDOH assessments and interventions completed:  Yes     Care Coordination Interventions Activated:  Yes  Care Coordination Interventions:  Yes, provided   Follow up plan: No further intervention required. Follow up call scheduled for Within the next 30 days.    Encounter Outcome:  Pt. Visit Completed   Lenor Derrick, MSW  Social Worker [facility] Care Management  [phone]

## 2022-02-23 NOTE — Patient Instructions (Signed)
Visit Information  Instructions: patient will work with SW to address concerns related to [facility] and Placement  Patient was given the following information about care management and care coordination services today, agreed to services, and gave verbal consent: 1.care management/care coordination services include personalized support from designated clinical staff supervised by their physician, including individualized plan of care and coordination with other care providers 2. 24/7 contact phone numbers for assistance for urgent and routine care needs. 3. The patient may stop care management/care coordination services at any time by phone call to the office staff.  Patient verbalizes understanding of instructions and care plan provided today and agrees to view in [place]. Active MyChart status and patient understanding of how to access instructions and care plan via MyChart confirmed with patient.     The care management team will reach out to the patient again over the next 30 days.   Lenor Derrick , MSW Social Worker [facility] Care Management  [phone]

## 2022-02-27 ENCOUNTER — Other Ambulatory Visit: Payer: Self-pay | Admitting: *Deleted

## 2022-02-27 DIAGNOSIS — E785 Hyperlipidemia, unspecified: Secondary | ICD-10-CM

## 2022-02-27 MED ORDER — ATORVASTATIN CALCIUM 40 MG PO TABS: 40.0000 mg | ORAL_TABLET | Freq: Every day | ORAL | 0 refills | Status: DC

## 2022-02-28 ENCOUNTER — Other Ambulatory Visit: Payer: Self-pay | Admitting: Student

## 2022-02-28 DIAGNOSIS — Z720 Tobacco use: Secondary | ICD-10-CM

## 2022-03-06 ENCOUNTER — Ambulatory Visit: Payer: Self-pay | Admitting: Licensed Clinical Social Worker

## 2022-03-06 NOTE — Patient Outreach (Signed)
  Care Coordination   Follow Up Visit Note   03/06/2022 Name: MARLEN MOLLICA MRN: [id number] DOB: [birth year]  Marny Lowenstein Grizzell is a 72 y.o. year old male who sees Orvis Brill, DO for primary care. I spoke with  Elspeth Cho by phone today.  What matters to the patients health and wellness today?  Medicaid and SSI Application    Goals Addressed               This Visit's Progress     MSW Care Coordination (pt-stated)        SW assisted patient with Medicaid application- Medicaid pending number is [id number]  SW assisted patient with applying for SSI. Patient will receive notification of approval or denial within 30-45 days.   SW requested patient to contact SW when a decision letter has  been received.   Patient wanting to go into a ALF. SW reviewed facilities with patients spouse. Spouse agreed to wait for Medicaid approval or denial before continuing search for ALF. SW dicussed process with family.         SDOH assessments and interventions completed:  Yes     Care Coordination Interventions Activated:  Yes  Care Coordination Interventions:  Yes, provided   Follow up plan: Follow up call scheduled for October 6    Encounter Outcome:  Pt. Visit Completed   Lenor Derrick, MSW  Social Worker [facility] Care Management  [phone]

## 2022-03-06 NOTE — Patient Instructions (Signed)
Visit Information  Instructions: patient will work with SW to address concerns related to [employer] and Medicaid   Patient was given the following information about care management and care coordination services today, agreed to services, and gave verbal consent: 1.care management/care coordination services include personalized support from designated clinical staff supervised by their physician, including individualized plan of care and coordination with other care providers 2. 24/7 contact phone numbers for assistance for urgent and routine care needs. 3. The patient may stop care management/care coordination services at any time by phone call to the office staff.  Patient verbalizes understanding of instructions and care plan provided today and agrees to view in [place]. Active MyChart status and patient understanding of how to access instructions and care plan via MyChart confirmed with patient.     The care management team will reach out to the patient again over the next 30 days.   Lenor Derrick , MSW Social Worker [facility]  [phone]

## 2022-03-18 ENCOUNTER — Other Ambulatory Visit: Payer: Self-pay | Admitting: Student

## 2022-03-18 DIAGNOSIS — Z720 Tobacco use: Secondary | ICD-10-CM

## 2022-03-19 ENCOUNTER — Telehealth: Payer: Self-pay | Admitting: *Deleted

## 2022-03-19 NOTE — Telephone Encounter (Signed)
Wife called to speak with Stephen Buckley regarding some ongoing concerns.  She said she tried the number I provided her [phone]) but it was no longer in service.  Will forward message to Stephen Buckley to reach out to wife when she gets a chance.  Stephen Buckley,CMA

## 2022-03-28 ENCOUNTER — Other Ambulatory Visit: Payer: Self-pay | Admitting: Student

## 2022-03-28 DIAGNOSIS — J302 Other seasonal allergic rhinitis: Secondary | ICD-10-CM

## 2022-03-30 ENCOUNTER — Telehealth: Payer: Self-pay | Admitting: *Deleted

## 2022-03-30 ENCOUNTER — Encounter: Payer: Medicare HMO | Admitting: Licensed Clinical Social Worker

## 2022-03-30 ENCOUNTER — Ambulatory Visit: Payer: Self-pay | Admitting: Licensed Clinical Social Worker

## 2022-03-30 DIAGNOSIS — Z111 Encounter for screening for respiratory tuberculosis: Secondary | ICD-10-CM

## 2022-03-30 NOTE — Telephone Encounter (Signed)
Patient is going to be placed at [facility] and wife is needing an FL2 completed and he will need a TB test placed/read.  Will forward to MD.  FL2 started and placed in MD's box.  [facility] Address: [address], [place], [place] [address] Phone: [phone]

## 2022-04-02 ENCOUNTER — Other Ambulatory Visit: Payer: Self-pay | Admitting: Student

## 2022-04-02 DIAGNOSIS — Z720 Tobacco use: Secondary | ICD-10-CM

## 2022-04-02 NOTE — Patient Outreach (Signed)
  Care Coordination  03/30/2022 Name: Stephen Buckley MRN: [id number] DOB: 04-04-1951   Care Coordination Outreach Attempts:  A third unsuccessful outreach was attempted today to offer the patient with information about available care coordination services as a benefit of their health plan.   Follow Up Plan:  No further outreach attempts will be made at this time. We have been unable to contact the patient to offer or enroll patient in care coordination services  Encounter Outcome:  No Answer  Care Coordination Interventions Activated:  No   Care Coordination Interventions:  No, not indicated   Lenor Derrick, MSW  Social Worker [facility] Care Management  [phone]

## 2022-04-04 NOTE — Telephone Encounter (Signed)
LM for wife that form is ready for pick up and we can go ahead and schedule the ppd also.  Durwin Davisson,CMA

## 2022-04-10 ENCOUNTER — Ambulatory Visit: Payer: Medicare Other

## 2022-04-26 ENCOUNTER — Other Ambulatory Visit: Payer: Self-pay | Admitting: Student

## 2022-05-10 ENCOUNTER — Encounter (HOSPITAL_COMMUNITY): Payer: Self-pay | Admitting: Psychiatry

## 2022-05-10 ENCOUNTER — Telehealth (HOSPITAL_BASED_OUTPATIENT_CLINIC_OR_DEPARTMENT_OTHER): Payer: Medicare Other | Admitting: Psychiatry

## 2022-05-10 DIAGNOSIS — F09 Unspecified mental disorder due to known physiological condition: Secondary | ICD-10-CM

## 2022-05-10 DIAGNOSIS — F319 Bipolar disorder, unspecified: Secondary | ICD-10-CM | POA: Diagnosis not present

## 2022-05-10 MED ORDER — CARBAMAZEPINE 200 MG PO TABS: ORAL_TABLET | ORAL | 2 refills | Status: DC

## 2022-05-10 MED ORDER — QUETIAPINE FUMARATE 200 MG PO TABS: 200.0000 mg | ORAL_TABLET | Freq: Every day | ORAL | 2 refills | Status: DC
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
## 2022-05-11 ENCOUNTER — Telehealth: Payer: Self-pay | Admitting: *Deleted

## 2022-05-11 NOTE — Telephone Encounter (Signed)
Wife called and states that she needs an updated FL2 (new date) for placement.  She states that patient now how has his medicaid in place for this.  Will forward to MD.  Johnney Ou

## 2022-05-12 ENCOUNTER — Other Ambulatory Visit (HOSPITAL_COMMUNITY): Payer: Self-pay | Admitting: Psychiatry

## 2022-05-12 DIAGNOSIS — F09 Unspecified mental disorder due to known physiological condition: Secondary | ICD-10-CM

## 2022-05-12 DIAGNOSIS — F319 Bipolar disorder, unspecified: Secondary | ICD-10-CM

## 2022-05-21 ENCOUNTER — Ambulatory Visit (INDEPENDENT_AMBULATORY_CARE_PROVIDER_SITE_OTHER): Payer: Medicare Other | Admitting: Neurology

## 2022-05-21 ENCOUNTER — Encounter: Payer: Self-pay | Admitting: Neurology

## 2022-05-21 VITALS — BP 134/76 | HR 92 | Ht 68.6 in | Wt 187.0 lb

## 2022-05-21 DIAGNOSIS — F02818 Dementia in other diseases classified elsewhere, unspecified severity, with other behavioral disturbance: Secondary | ICD-10-CM

## 2022-05-21 DIAGNOSIS — S069X9S Unspecified intracranial injury with loss of consciousness of unspecified duration, sequela: Secondary | ICD-10-CM | POA: Diagnosis not present

## 2022-05-21 MED ORDER — VITAMIN B-12 1000 MCG PO TABS: 1000.0000 ug | ORAL_TABLET | Freq: Every day | ORAL | 3 refills | Status: DC

## 2022-05-21 MED ORDER — DONEPEZIL HCL 5 MG PO TABS: 5.0000 mg | ORAL_TABLET | Freq: Every day | ORAL | 11 refills | Status: DC
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
## 2022-05-22 NOTE — Telephone Encounter (Signed)
Patients wife called to check on the status of the form being corrected. The form is still upfront but does not have the new date that was requested. It should be dated for 05/25/22. Patient will lose his room at nursing facility if this form is not corrected and turned in by Thursday 05/24/22.  I have paged Dr. Owens Shark.

## 2022-05-23 ENCOUNTER — Encounter: Payer: Self-pay | Admitting: Internal Medicine

## 2022-05-23 ENCOUNTER — Ambulatory Visit (INDEPENDENT_AMBULATORY_CARE_PROVIDER_SITE_OTHER): Payer: Medicare Other | Admitting: Internal Medicine

## 2022-05-23 VITALS — BP 152/78 | HR 97 | Ht 68.5 in | Wt 186.4 lb

## 2022-05-23 DIAGNOSIS — E785 Hyperlipidemia, unspecified: Secondary | ICD-10-CM | POA: Diagnosis not present

## 2022-05-23 DIAGNOSIS — E559 Vitamin D deficiency, unspecified: Secondary | ICD-10-CM

## 2022-05-23 DIAGNOSIS — I639 Cerebral infarction, unspecified: Secondary | ICD-10-CM

## 2022-05-23 DIAGNOSIS — K219 Gastro-esophageal reflux disease without esophagitis: Secondary | ICD-10-CM | POA: Diagnosis not present

## 2022-05-23 DIAGNOSIS — S069X9S Unspecified intracranial injury with loss of consciousness of unspecified duration, sequela: Secondary | ICD-10-CM | POA: Diagnosis not present

## 2022-05-23 DIAGNOSIS — F02818 Dementia in other diseases classified elsewhere, unspecified severity, with other behavioral disturbance: Secondary | ICD-10-CM

## 2022-05-23 DIAGNOSIS — Z122 Encounter for screening for malignant neoplasm of respiratory organs: Secondary | ICD-10-CM

## 2022-05-23 DIAGNOSIS — Z1211 Encounter for screening for malignant neoplasm of colon: Secondary | ICD-10-CM

## 2022-05-23 DIAGNOSIS — G40909 Epilepsy, unspecified, not intractable, without status epilepticus: Secondary | ICD-10-CM

## 2022-05-23 DIAGNOSIS — F17200 Nicotine dependence, unspecified, uncomplicated: Secondary | ICD-10-CM

## 2022-05-23 DIAGNOSIS — R7303 Prediabetes: Secondary | ICD-10-CM

## 2022-05-23 DIAGNOSIS — S069X9D Unspecified intracranial injury with loss of consciousness of unspecified duration, subsequent encounter: Secondary | ICD-10-CM

## 2022-05-23 DIAGNOSIS — Z111 Encounter for screening for respiratory tuberculosis: Secondary | ICD-10-CM

## 2022-05-23 DIAGNOSIS — I10 Essential (primary) hypertension: Secondary | ICD-10-CM

## 2022-05-23 DIAGNOSIS — Z23 Encounter for immunization: Secondary | ICD-10-CM | POA: Diagnosis not present

## 2022-05-23 DIAGNOSIS — Z0001 Encounter for general adult medical examination with abnormal findings: Secondary | ICD-10-CM

## 2022-05-23 DIAGNOSIS — E538 Deficiency of other specified B group vitamins: Secondary | ICD-10-CM

## 2022-05-23 NOTE — Assessment & Plan Note (Addendum)
Previously documented history of TBI with resultant seizure disorder and memory impairment with behavioral disturbance.  He is currently prescribed carbamazepine and has not had a seizure in several years.  He is on Namenda and Aricept for memory deficits and takes Seroquel nightly.  No medication changes today.  He was last seen by neurology on Monday (11/27).  Aricept 5 mg nightly and vitamin B12 supplementation were started.  No additional changes today.

## 2022-05-23 NOTE — Assessment & Plan Note (Addendum)
He continues to smoke cigarettes and understands that he needs to quit.  He has previously been prescribed Chantix but is currently out of medication.  We will further discuss this at follow-up in 4 weeks. -Meets criteria for lung cancer screening.  LDCT has been ordered today.
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
## 2022-05-23 NOTE — Telephone Encounter (Signed)
Form has been completed and updated. Called patients wife to let her know. She is going to pick it up in the morning. Placed forms in the box upfront.    Thanks!

## 2022-05-23 NOTE — Assessment & Plan Note (Signed)
Presenting today to establish care.  Previous records and labs were reviewed. -Baseline labs ordered today -Influenza vaccine administered today -TB skin test administered -Follow-up in 4 weeks to review lab results and address outstanding preventative care items

## 2022-05-23 NOTE — Assessment & Plan Note (Addendum)
Previously documented history of CVA, though on chart review it seems that this event was more consistent with TIA.  He is currently on ASA and statin.  No focal neurologic deficits appreciated today.

## 2022-05-23 NOTE — Assessment & Plan Note (Signed)
He is currently prescribed amlodipine 10 mg daily for treatment of hypertension.  His blood pressure today is 159/85. -No medication changes today given that he has previously been well controlled on amlodipine.  We will follow-up in 4 weeks for HTN check.

## 2022-05-23 NOTE — Assessment & Plan Note (Signed)
Noted on previous labs. -Repeat A1c ordered today 

## 2022-05-23 NOTE — Patient Instructions (Signed)
It was a pleasure to see you today.  Thank you for giving [place] the opportunity to be involved in your care.  Below is a brief recap of your visit and next steps.  We will plan to see you again in 4 weeks.  Summary You have established care today We will check labs You will receive a TB test and your flu shot Follow up in 4 weeks to review results and discuss next steps

## 2022-05-24 ENCOUNTER — Other Ambulatory Visit: Payer: Self-pay | Admitting: Internal Medicine

## 2022-05-24 DIAGNOSIS — E559 Vitamin D deficiency, unspecified: Secondary | ICD-10-CM

## 2022-05-24 LAB — CBC WITH DIFFERENTIAL/PLATELET
Basophils Absolute: 0 10*3/uL (ref 0.0–0.2)
Basos: 1 %
EOS (ABSOLUTE): 0.1 10*3/uL (ref 0.0–0.4)
Eos: 1 %
Hematocrit: 43.1 % (ref 37.5–51.0)
Hemoglobin: 14.8 g/dL (ref 13.0–17.7)
Immature Grans (Abs): 0 10*3/uL (ref 0.0–0.1)
Immature Granulocytes: 0 %
Lymphocytes Absolute: 1.6 10*3/uL (ref 0.7–3.1)
Lymphs: 31 %
MCH: 31 pg (ref 26.6–33.0)
MCHC: 34.3 g/dL (ref 31.5–35.7)
MCV: 90 fL (ref 79–97)
Monocytes Absolute: 0.5 10*3/uL (ref 0.1–0.9)
Monocytes: 9 %
Neutrophils Absolute: 3 10*3/uL (ref 1.4–7.0)
Neutrophils: 58 %
Platelets: 228 10*3/uL (ref 150–450)
RBC: 4.78 x10E6/uL (ref 4.14–5.80)
RDW: 12.9 % (ref 11.6–15.4)
WBC: 5.2 10*3/uL (ref 3.4–10.8)

## 2022-05-24 LAB — CMP14+EGFR
ALT: 18 IU/L (ref 0–44)
AST: 18 IU/L (ref 0–40)
Albumin/Globulin Ratio: 2 (ref 1.2–2.2)
Albumin: 5 g/dL — ABNORMAL HIGH (ref 3.9–4.9)
Alkaline Phosphatase: 80 IU/L (ref 44–121)
BUN/Creatinine Ratio: 11 (ref 10–24)
BUN: 8 mg/dL (ref 8–27)
Bilirubin Total: 0.3 mg/dL (ref 0.0–1.2)
CO2: 22 mmol/L (ref 20–29)
Calcium: 9.5 mg/dL (ref 8.6–10.2)
Chloride: 108 mmol/L — ABNORMAL HIGH (ref 96–106)
Creatinine, Ser: 0.75 mg/dL — ABNORMAL LOW (ref 0.76–1.27)
Globulin, Total: 2.5 g/dL (ref 1.5–4.5)
Glucose: 85 mg/dL (ref 70–99)
Potassium: 4.1 mmol/L (ref 3.5–5.2)
Sodium: 144 mmol/L (ref 134–144)
Total Protein: 7.5 g/dL (ref 6.0–8.5)
eGFR: 98 mL/min/{1.73_m2} (ref >59)

## 2022-05-24 LAB — B12 AND FOLATE PANEL
Folate: 9.8 ng/mL (ref >3.0)
Vitamin B-12: 310 pg/mL (ref 232–1245)

## 2022-05-24 LAB — VITAMIN D 25 HYDROXY (VIT D DEFICIENCY, FRACTURES): Vit D, 25-Hydroxy: 15.1 ng/mL — ABNORMAL LOW (ref 30.0–100.0)

## 2022-05-24 LAB — LIPID PANEL
Chol/HDL Ratio: 4.9 ratio (ref 0.0–5.0)
Cholesterol, Total: 163 mg/dL (ref 100–199)
HDL: 33 mg/dL — ABNORMAL LOW (ref >39)
LDL Chol Calc (NIH): 113 mg/dL — ABNORMAL HIGH (ref 0–99)
Triglycerides: 92 mg/dL (ref 0–149)
VLDL Cholesterol Cal: 17 mg/dL (ref 5–40)

## 2022-05-24 LAB — TSH+FREE T4
Free T4: 0.98 ng/dL (ref 0.82–1.77)
TSH: 0.964 u[IU]/mL (ref 0.450–4.500)

## 2022-05-24 LAB — HEMOGLOBIN A1C
Est. average glucose Bld gHb Est-mCnc: 128 mg/dL
Hgb A1c MFr Bld: 6.1 % — ABNORMAL HIGH (ref 4.8–5.6)

## 2022-05-24 MED ORDER — VITAMIN D (ERGOCALCIFEROL) 1.25 MG (50000 UNIT) PO CAPS: 50000.0000 [IU] | ORAL_CAPSULE | ORAL | 0 refills | Status: AC

## 2022-05-24 NOTE — Progress Notes (Signed)
Vitamin D 50,000 unit weekly supplementation x8 weeks sent to [facility] in [place]

## 2022-05-25 LAB — TB SKIN TEST
Induration: 0.1 mm
TB Skin Test: NEGATIVE

## 2022-06-04 ENCOUNTER — Telehealth: Payer: Self-pay | Admitting: Internal Medicine

## 2022-06-04 ENCOUNTER — Ambulatory Visit: Payer: Self-pay | Admitting: Licensed Clinical Social Worker

## 2022-06-04 ENCOUNTER — Other Ambulatory Visit: Payer: Self-pay

## 2022-06-04 DIAGNOSIS — E785 Hyperlipidemia, unspecified: Secondary | ICD-10-CM

## 2022-06-04 DIAGNOSIS — I10 Essential (primary) hypertension: Secondary | ICD-10-CM

## 2022-06-04 MED ORDER — AMLODIPINE BESYLATE 10 MG PO TABS: 10.0000 mg | ORAL_TABLET | Freq: Every day | ORAL | 3 refills | Status: DC

## 2022-06-04 MED ORDER — ATORVASTATIN CALCIUM 40 MG PO TABS: 40.0000 mg | ORAL_TABLET | Freq: Every day | ORAL | 0 refills | Status: DC

## 2022-06-04 MED ORDER — FINASTERIDE 5 MG PO TABS: 5.0000 mg | ORAL_TABLET | Freq: Every day | ORAL | 0 refills | Status: DC

## 2022-06-04 NOTE — Telephone Encounter (Signed)
Prescription Request  06/04/2022  Is this a "Controlled Substance" medicine? No  LOV: 05/23/2022  What is the name of the medication or equipment? finasteride (PROSCAR) 5 MG tablet [[id number]]   (NO LONGER GET FROM THIS PCP NO LONGER SEE PROVIDER SWITCH TO DR Doren Custard   atorvastatin (LIPITOR) 40 MG tablet [[id number]]   Also Vitmain D does patient need rx or can be taken OTC please contact patient at [phone]  Have you contacted your pharmacy to request a refill? No   Which pharmacy would you like this sent to?  [facility]   Patient notified that their request is being sent to the clinical staff for review and that they should receive a response within 2 business days.   Please advise at Mobile [phone] (mobile)

## 2022-06-04 NOTE — Telephone Encounter (Signed)
Refills sent

## 2022-06-04 NOTE — Patient Outreach (Signed)
SW removed self from care team on today.

## 2022-06-05 ENCOUNTER — Other Ambulatory Visit: Payer: Self-pay

## 2022-06-05 ENCOUNTER — Telehealth: Payer: Self-pay

## 2022-06-05 DIAGNOSIS — I10 Essential (primary) hypertension: Secondary | ICD-10-CM

## 2022-06-05 DIAGNOSIS — E559 Vitamin D deficiency, unspecified: Secondary | ICD-10-CM

## 2022-06-11 DIAGNOSIS — E559 Vitamin D deficiency, unspecified: Secondary | ICD-10-CM | POA: Diagnosis not present

## 2022-06-12 LAB — VITAMIN D 25 HYDROXY (VIT D DEFICIENCY, FRACTURES): Vit D, 25-Hydroxy: 68.7 ng/mL (ref 30.0–100.0)

## 2022-06-14 ENCOUNTER — Encounter: Payer: Self-pay | Admitting: Internal Medicine

## 2022-06-14 ENCOUNTER — Ambulatory Visit (INDEPENDENT_AMBULATORY_CARE_PROVIDER_SITE_OTHER): Payer: Medicare Other | Admitting: Internal Medicine

## 2022-06-14 VITALS — BP 134/77 | HR 98 | Ht 68.0 in | Wt 189.2 lb

## 2022-06-14 DIAGNOSIS — E785 Hyperlipidemia, unspecified: Secondary | ICD-10-CM | POA: Diagnosis not present

## 2022-06-14 DIAGNOSIS — F17200 Nicotine dependence, unspecified, uncomplicated: Secondary | ICD-10-CM | POA: Diagnosis not present

## 2022-06-14 DIAGNOSIS — I10 Essential (primary) hypertension: Secondary | ICD-10-CM | POA: Diagnosis not present

## 2022-06-14 DIAGNOSIS — E559 Vitamin D deficiency, unspecified: Secondary | ICD-10-CM

## 2022-06-14 MED ORDER — VITAMIN B-12 1000 MCG PO TABS: 1000.0000 ug | ORAL_TABLET | Freq: Every day | ORAL | 3 refills | Status: DC

## 2022-06-14 MED ORDER — ATORVASTATIN CALCIUM 80 MG PO TABS: 80.0000 mg | ORAL_TABLET | Freq: Every day | ORAL | 1 refills | Status: DC

## 2022-06-14 NOTE — Patient Instructions (Signed)
It was a pleasure to see you today.  Thank you for giving [place] the opportunity to be involved in your care.  Below is a brief recap of your visit and next steps.  We will plan to see you again in 3 months.  Summary Increase atorvastatin 80 mg daily NO additional medication changes today We will plan for follow up in 3 months
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
## 2022-06-19 ENCOUNTER — Other Ambulatory Visit: Payer: Self-pay

## 2022-06-19 ENCOUNTER — Telehealth: Payer: Self-pay | Admitting: Internal Medicine

## 2022-06-19 DIAGNOSIS — E785 Hyperlipidemia, unspecified: Secondary | ICD-10-CM

## 2022-06-19 MED ORDER — ATORVASTATIN CALCIUM 80 MG PO TABS: 80.0000 mg | ORAL_TABLET | Freq: Every day | ORAL | 1 refills | Status: DC

## 2022-06-19 NOTE — Telephone Encounter (Signed)
Refill sent to [facility]. 

## 2022-06-19 NOTE — Telephone Encounter (Signed)
Pt called stating his med was sent to wrong phar. Can you please resent to [place]?   atorvastatin (LIPITOR) 80 MG tablet    [facility] [place]

## 2022-06-21 ENCOUNTER — Ambulatory Visit: Payer: Medicare Other | Admitting: Internal Medicine

## 2022-06-21 ENCOUNTER — Telehealth: Payer: Self-pay | Admitting: Internal Medicine

## 2022-06-21 NOTE — Telephone Encounter (Signed)
Pt called wanting to know status of pwk that was dropped off last week. Can you please look into this & give pt a call?  Please fax to number below when completed.  Fax # [phone]

## 2022-06-21 NOTE — Assessment & Plan Note (Signed)
LDCT is scheduled for 07/06/2022

## 2022-06-21 NOTE — Assessment & Plan Note (Signed)
Currently prescribed atorvastatin 40 mg daily for treatment of hyperlipidemia.  Lipid panel updated last month.  Total cholesterol 163, LDL 113. -LDL remains above goal.  Given his history and cardiovascular risk factors, increase atorvastatin to 80 mg daily.

## 2022-06-21 NOTE — Assessment & Plan Note (Signed)
HTN follow-up arranged today due to elevated blood pressure at his appointment to establish care last month.  He is currently prescribed amlodipine 10 mg daily for treatment of hypertension.  His blood pressure today is 134/77. -No medication changes today.  Continue current regimen

## 2022-06-21 NOTE — Assessment & Plan Note (Signed)
Vitamin D level has normalized.  He was previously vitamin D deficient and received high-dose supplementation on a daily basis for 8 days. -Defer additional supplementation for now

## 2022-06-22 ENCOUNTER — Telehealth: Payer: Self-pay | Admitting: Internal Medicine

## 2022-06-22 NOTE — Telephone Encounter (Signed)
Pt wife called stating that they thought he was supposed to have came off Vit D?? Can you please look into this?   [employer]   Phone # [phone]  Aram Beecham Orthoptist)  Fax # [phone]

## 2022-06-22 NOTE — Telephone Encounter (Signed)
Vitamin D d/c'd by Doren Custard. Will fax d/c order to facility

## 2022-06-27 ENCOUNTER — Telehealth: Payer: Self-pay | Admitting: Internal Medicine

## 2022-06-27 MED ORDER — PANTOPRAZOLE SODIUM 40 MG PO TBEC: 40.0000 mg | DELAYED_RELEASE_TABLET | Freq: Every day | ORAL | 1 refills | Status: DC

## 2022-06-27 NOTE — Telephone Encounter (Signed)
Kim called from [facility] living [facility] needs refill to [facility]   Prescription Request  06/27/2022  Is this a "Controlled Substance" medicine? No  LOV: 06/14/2022  What is the name of the medication or equipment? pantoprazole (PROTONIX) 40 MG tablet [[id number]]   Have you contacted your pharmacy to request a refill? No   Which pharmacy would you like this sent to? [facility]    Patient notified that their request is being sent to the clinical staff for review and that they should receive a response within 2 business days.   Please advise at [facility]

## 2022-06-28 ENCOUNTER — Other Ambulatory Visit: Payer: Self-pay

## 2022-06-28 ENCOUNTER — Telehealth: Payer: Self-pay | Admitting: Neurology

## 2022-06-28 MED ORDER — ANORO ELLIPTA 62.5-25 MCG/ACT IN AEPB: 1.0000 | INHALATION_SPRAY | Freq: Every day | RESPIRATORY_TRACT | 1 refills | Status: DC

## 2022-06-28 NOTE — Telephone Encounter (Signed)
[facility]) Pt is in a facility, we picked up Donepezil 5 mg  on 06/21/22.. We need an order to be able to administer Mr. Deboy's medication. Would like a call from the nurse to [phone]  Fax to:[phone]

## 2022-06-28 NOTE — Telephone Encounter (Signed)
Resent paperwork, received confirmation fax went through, notified sherry and left VM

## 2022-06-28 NOTE — Telephone Encounter (Signed)
Papers have been filled out and faxed back.

## 2022-06-28 NOTE — Telephone Encounter (Signed)
Order printed and faxed as requested.

## 2022-06-28 NOTE — Telephone Encounter (Signed)
Stephen Buckley called asked please refax these papers and call the patient back once went through [phone]. Patient said she just spoke to nurse this morning. There are no papers at front desk.

## 2022-07-04 ENCOUNTER — Telehealth: Payer: Self-pay | Admitting: Internal Medicine

## 2022-07-04 NOTE — Telephone Encounter (Signed)
Revision to [employer] forms.  Call TPhilipp Ovens [phone] when getting ready to fax  Fax forms back to [phone].  Copied Noted sleeved

## 2022-07-06 ENCOUNTER — Ambulatory Visit (HOSPITAL_COMMUNITY)
Admission: RE | Admit: 2022-07-06 | Discharge: 2022-07-06 | Disposition: A | Discharge status: Home / Self Care | Payer: Medicare Other | Source: Ambulatory Visit | Attending: Internal Medicine | Admitting: Internal Medicine

## 2022-07-06 DIAGNOSIS — I2510 Atherosclerotic heart disease of native coronary artery without angina pectoris: Secondary | ICD-10-CM | POA: Diagnosis not present

## 2022-07-06 DIAGNOSIS — Z122 Encounter for screening for malignant neoplasm of respiratory organs: Secondary | ICD-10-CM | POA: Insufficient documentation

## 2022-07-06 DIAGNOSIS — B182 Chronic viral hepatitis C: Secondary | ICD-10-CM | POA: Diagnosis not present

## 2022-07-06 DIAGNOSIS — J439 Emphysema, unspecified: Secondary | ICD-10-CM | POA: Insufficient documentation

## 2022-07-06 DIAGNOSIS — F17210 Nicotine dependence, cigarettes, uncomplicated: Secondary | ICD-10-CM | POA: Diagnosis not present

## 2022-07-06 DIAGNOSIS — I700 Atherosclerosis of aorta: Secondary | ICD-10-CM | POA: Insufficient documentation

## 2022-07-10 DIAGNOSIS — L84 Corns and callosities: Secondary | ICD-10-CM | POA: Diagnosis not present

## 2022-07-10 DIAGNOSIS — R2689 Other abnormalities of gait and mobility: Secondary | ICD-10-CM | POA: Diagnosis not present

## 2022-07-10 DIAGNOSIS — M79674 Pain in right toe(s): Secondary | ICD-10-CM | POA: Diagnosis not present

## 2022-07-10 DIAGNOSIS — M79675 Pain in left toe(s): Secondary | ICD-10-CM | POA: Diagnosis not present

## 2022-07-10 DIAGNOSIS — L603 Nail dystrophy: Secondary | ICD-10-CM | POA: Diagnosis not present

## 2022-07-10 DIAGNOSIS — B351 Tinea unguium: Secondary | ICD-10-CM | POA: Diagnosis not present

## 2022-07-16 ENCOUNTER — Telehealth: Payer: Self-pay | Admitting: Internal Medicine

## 2022-07-16 NOTE — Telephone Encounter (Signed)
Stephen Buckley, [phone]  Need clarification if pt needs to be on '40mg'$  or 80 mg of atorvastatin?

## 2022-07-16 NOTE — Telephone Encounter (Signed)
Clarified meds

## 2022-07-16 NOTE — Telephone Encounter (Signed)
Stephen Buckley spoke to pharmacy

## 2022-07-20 ENCOUNTER — Other Ambulatory Visit: Payer: Self-pay | Admitting: Internal Medicine

## 2022-07-20 DIAGNOSIS — E785 Hyperlipidemia, unspecified: Secondary | ICD-10-CM

## 2022-07-20 DIAGNOSIS — I10 Essential (primary) hypertension: Secondary | ICD-10-CM

## 2022-07-20 DIAGNOSIS — J302 Other seasonal allergic rhinitis: Secondary | ICD-10-CM

## 2022-07-23 ENCOUNTER — Other Ambulatory Visit: Payer: Self-pay | Admitting: Internal Medicine

## 2022-07-25 ENCOUNTER — Encounter: Payer: Self-pay | Admitting: Internal Medicine

## 2022-07-25 ENCOUNTER — Ambulatory Visit (INDEPENDENT_AMBULATORY_CARE_PROVIDER_SITE_OTHER): Payer: Medicare Other | Admitting: Internal Medicine

## 2022-07-25 ENCOUNTER — Telehealth: Payer: Self-pay | Admitting: Internal Medicine

## 2022-07-25 VITALS — BP 144/81 | HR 86 | Ht 69.0 in | Wt 189.6 lb

## 2022-07-25 DIAGNOSIS — G629 Polyneuropathy, unspecified: Secondary | ICD-10-CM

## 2022-07-25 DIAGNOSIS — H04123 Dry eye syndrome of bilateral lacrimal glands: Secondary | ICD-10-CM | POA: Diagnosis not present

## 2022-07-25 MED ORDER — GABAPENTIN 100 MG PO CAPS: 100.0000 mg | ORAL_CAPSULE | Freq: Every evening | ORAL | 3 refills | Status: DC | PRN

## 2022-07-25 MED ORDER — CARBOXYMETHYLCELLULOSE SODIUM 1 % OP SOLN: 1.0000 [drp] | OPHTHALMIC | 12 refills | Status: DC | PRN

## 2022-07-25 NOTE — Progress Notes (Signed)
   Acute Office Visit  Subjective:     Patient ID: Stephen Buckley, male    DOB: 01/24/1951, 72 y.o.   MRN: [id number]  Chief Complaint  Patient presents with   Hand Problem    Feels like fingers are numbness. Woke up a couple nights ago (1/29) and fingers felt stiff and numb.    [name] presents for an acute visit today to discuss bilateral hand numbness.  He states that he has experienced numbness in the 2nd-5th digits of both hands for the past year.  2 nights ago he woke up with cramping in both hands.  He describes stiffness.  This was greatly concerning to him and prompted him to schedule an appointment today.  The cramping sensation has not recurred.  His symptoms are most bothersome at night and he describes radiation of his left arm with hypersensitivity to cold temperatures.  Review of Systems  Eyes:  Positive for redness (Dry eyes bilaterally).  Neurological:  Positive for tingling (Bilateral hands).  All other systems reviewed and are negative.     Objective:    BP (!) 144/81   Pulse 86   Ht '5\' 9"'$  (1.753 m)   Wt 189 lb 9.6 oz (86 kg)   SpO2 94%   BMI 28.00 kg/m   Physical Exam Vitals reviewed.  Eyes:     General:        Right eye: No discharge.        Left eye: No discharge.     Conjunctiva/sclera: Conjunctivae normal.  Neurological:     General: No focal deficit present.     Mental Status: He is oriented to person, place, and time.     Sensory: No sensory deficit.     Comments: Grip strength intact bilaterally.  No sensory deficit appreciated.  Negative Phalen's/Tinel's bilaterally.       Assessment & Plan:   Problem List Items Addressed This Visit       Neuropathy    Presenting today for an acute visit for evaluation of numbness/tingling in digits 2-5 of both hands.  This has been present for the past year.  2 nights ago he experienced a cramping sensation in both hands.  Symptoms are worse at night.  No sensory deficits appreciated today.   Grip strength is intact.  His symptoms seem most consistent with peripheral neuropathy. -I have prescribed gabapentin 100 mg nightly as needed for symptom relief -He has previously scheduled follow-up for March      Dry eyes    Refresh eyedrops have been prescribed today      Meds ordered this encounter  Medications   gabapentin (NEURONTIN) 100 MG capsule    Sig: Take 1 capsule (100 mg total) by mouth at bedtime as needed (for numbness in hands).    Dispense:  90 capsule    Refill:  3   carboxymethylcellulose 1 % ophthalmic solution    Sig: Apply 1 drop to eye as needed (dry/itching eyes).    Dispense:  30 mL    Refill:  12    Return if symptoms worsen or fail to improve.  Johnette Abraham, MD

## 2022-07-25 NOTE — Patient Instructions (Signed)
It was a pleasure to see you today.  Thank you for giving [place] the opportunity to be involved in your care.  Below is a brief recap of your visit and next steps.  We will plan to see you again in in March.  Summary Start gabapentin 100 mg nightly for numbness / tingling in your hands Start Refresh eye drops for as needed use for relief of itching eyes

## 2022-07-25 NOTE — Assessment & Plan Note (Signed)
Refresh eyedrops have been prescribed today

## 2022-07-25 NOTE — Assessment & Plan Note (Signed)
Presenting today for an acute visit for evaluation of numbness/tingling in digits 2-5 of both hands.  This has been present for the past year.  2 nights ago he experienced a cramping sensation in both hands.  Symptoms are worse at night.  No sensory deficits appreciated today.  Grip strength is intact.  His symptoms seem most consistent with peripheral neuropathy. -I have prescribed gabapentin 100 mg nightly as needed for symptom relief -He has previously scheduled follow-up for March

## 2022-07-25 NOTE — Telephone Encounter (Signed)
Pharmacy called asking will refresh eye drops be okay to use.  Call back # [phone]

## 2022-07-26 NOTE — Telephone Encounter (Signed)
Spoke with pharmacy

## 2022-08-02 ENCOUNTER — Other Ambulatory Visit: Payer: Self-pay | Admitting: Neurology

## 2022-08-09 ENCOUNTER — Telehealth (HOSPITAL_COMMUNITY): Payer: Self-pay | Admitting: *Deleted

## 2022-08-09 ENCOUNTER — Telehealth (HOSPITAL_COMMUNITY): Payer: Medicare Other | Admitting: Psychiatry

## 2022-08-09 ENCOUNTER — Encounter (HOSPITAL_COMMUNITY): Payer: Self-pay | Admitting: Psychiatry

## 2022-08-09 ENCOUNTER — Telehealth (HOSPITAL_BASED_OUTPATIENT_CLINIC_OR_DEPARTMENT_OTHER): Payer: Medicare Other | Admitting: Psychiatry

## 2022-08-09 DIAGNOSIS — F319 Bipolar disorder, unspecified: Secondary | ICD-10-CM | POA: Diagnosis not present

## 2022-08-09 DIAGNOSIS — F09 Unspecified mental disorder due to known physiological condition: Secondary | ICD-10-CM

## 2022-08-09 MED ORDER — CARBAMAZEPINE 200 MG PO TABS: ORAL_TABLET | ORAL | 2 refills | Status: DC

## 2022-08-09 MED ORDER — QUETIAPINE FUMARATE 200 MG PO TABS: 200.0000 mg | ORAL_TABLET | Freq: Every day | ORAL | 2 refills | Status: DC
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
## 2022-08-09 NOTE — Telephone Encounter (Signed)
Writer attempted to call pt ACLF "[facility]" at number provided by provider to get the correct pharmacy information to send pt medications to. Unable to LVM as mailbox is full at this time.

## 2022-08-10 ENCOUNTER — Other Ambulatory Visit: Payer: Self-pay | Admitting: Neurology

## 2022-08-10 NOTE — Telephone Encounter (Signed)
Please try to call his wife who is involved in the treatment plan and provide information about assisted living place.

## 2022-08-10 NOTE — Telephone Encounter (Signed)
Patient is not sure if assisted living facility using any particular pharmacy.  I did send medication to [facility] but need to know if we have to send somewhere else.  We should have contact information of the assisted living facility in our chart.  Patient has memory issues and did not provide correct information.

## 2022-08-10 NOTE — Telephone Encounter (Signed)
Will contact spouse since unable to speak with anyone at assisted living facility.

## 2022-08-10 NOTE — Telephone Encounter (Signed)
Are they wanting names? Want to know about ACLF's in general?

## 2022-08-11 ENCOUNTER — Other Ambulatory Visit (HOSPITAL_COMMUNITY): Payer: Self-pay | Admitting: Psychiatry

## 2022-08-11 DIAGNOSIS — F319 Bipolar disorder, unspecified: Secondary | ICD-10-CM

## 2022-08-11 DIAGNOSIS — F09 Unspecified mental disorder due to known physiological condition: Secondary | ICD-10-CM

## 2022-08-16 ENCOUNTER — Other Ambulatory Visit (HOSPITAL_COMMUNITY): Payer: Self-pay | Admitting: Psychiatry

## 2022-08-16 DIAGNOSIS — H2513 Age-related nuclear cataract, bilateral: Secondary | ICD-10-CM | POA: Diagnosis not present

## 2022-08-16 DIAGNOSIS — F09 Unspecified mental disorder due to known physiological condition: Secondary | ICD-10-CM

## 2022-08-16 DIAGNOSIS — H25013 Cortical age-related cataract, bilateral: Secondary | ICD-10-CM | POA: Diagnosis not present

## 2022-08-16 DIAGNOSIS — F319 Bipolar disorder, unspecified: Secondary | ICD-10-CM

## 2022-08-16 DIAGNOSIS — H02105 Unspecified ectropion of left lower eyelid: Secondary | ICD-10-CM | POA: Diagnosis not present

## 2022-08-17 ENCOUNTER — Telehealth: Payer: Self-pay

## 2022-08-17 ENCOUNTER — Other Ambulatory Visit: Payer: Self-pay | Admitting: Neurology

## 2022-08-17 DIAGNOSIS — F09 Unspecified mental disorder due to known physiological condition: Secondary | ICD-10-CM

## 2022-08-17 DIAGNOSIS — F319 Bipolar disorder, unspecified: Secondary | ICD-10-CM

## 2022-08-20 ENCOUNTER — Other Ambulatory Visit: Payer: Self-pay

## 2022-08-20 MED ORDER — ASPIRIN 81 MG PO TBEC: 81.0000 mg | DELAYED_RELEASE_TABLET | Freq: Every day | ORAL | 3 refills | Status: DC

## 2022-09-03 ENCOUNTER — Telehealth: Payer: Self-pay | Admitting: Internal Medicine

## 2022-09-04 ENCOUNTER — Ambulatory Visit (INDEPENDENT_AMBULATORY_CARE_PROVIDER_SITE_OTHER): Payer: 59 | Admitting: Internal Medicine

## 2022-09-04 ENCOUNTER — Other Ambulatory Visit: Payer: Self-pay | Admitting: Internal Medicine

## 2022-09-04 ENCOUNTER — Encounter: Payer: Self-pay | Admitting: Internal Medicine

## 2022-09-04 VITALS — BP 138/83 | HR 102 | Ht 69.0 in | Wt 196.6 lb

## 2022-09-04 DIAGNOSIS — G629 Polyneuropathy, unspecified: Secondary | ICD-10-CM | POA: Diagnosis not present

## 2022-09-04 DIAGNOSIS — H04123 Dry eye syndrome of bilateral lacrimal glands: Secondary | ICD-10-CM

## 2022-09-04 DIAGNOSIS — Z23 Encounter for immunization: Secondary | ICD-10-CM

## 2022-09-04 DIAGNOSIS — E785 Hyperlipidemia, unspecified: Secondary | ICD-10-CM | POA: Diagnosis not present

## 2022-09-04 DIAGNOSIS — J302 Other seasonal allergic rhinitis: Secondary | ICD-10-CM | POA: Diagnosis not present

## 2022-09-04 MED ORDER — GABAPENTIN 300 MG PO CAPS: 300.0000 mg | ORAL_CAPSULE | Freq: Three times a day (TID) | ORAL | 3 refills | Status: DC

## 2022-09-04 MED ORDER — CARBOXYMETHYLCELLULOSE SODIUM 1 % OP SOLN: 1.0000 [drp] | OPHTHALMIC | 12 refills | Status: DC | PRN

## 2022-09-04 MED ORDER — ATORVASTATIN CALCIUM 80 MG PO TABS: 80.0000 mg | ORAL_TABLET | Freq: Every day | ORAL | 1 refills | Status: DC

## 2022-09-04 MED ORDER — CETIRIZINE HCL 10 MG PO TABS: 10.0000 mg | ORAL_TABLET | Freq: Every day | ORAL | 2 refills | Status: DC

## 2022-09-04 NOTE — Patient Instructions (Signed)
It was a pleasure to see you today.  Thank you for giving [place] the opportunity to be involved in your care.  Below is a brief recap of your visit and next steps.  We will plan to see you again in 3 months.  Summary Increase gabapentin to 300 mg three times daily Cetirizine refilled for allergy relief  Recommend using Refresh eye drops as needed for eye irritation Follow up in 3 months

## 2022-09-04 NOTE — Assessment & Plan Note (Signed)
Secondary to seasonal allergies.  Refresh eyedrops have been refilled today. Will also start cetirizine 10 mg daily.
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
## 2022-09-04 NOTE — Assessment & Plan Note (Signed)
Received first zoster vaccination in October 2022.  He is due for his second dose.  The aide from his assisted living facility reports that a pharmacist will be coming to their facility in the near future to update all outstanding vaccinations.

## 2022-09-04 NOTE — Assessment & Plan Note (Signed)
Last Tdap vaccine completed in January 2013.  I recommended having this updated when a pharmacist comes to his assisted living facility this spring.

## 2022-09-04 NOTE — Assessment & Plan Note (Signed)
Likely etiology of nasal congestion and watery eyes. -Cetirizine 10 mg daily started today

## 2022-09-04 NOTE — Assessment & Plan Note (Signed)
Gabapentin 100 mg nightly was added at his last appointment after he endorsed numbness/tingling in both of his hands.  His symptoms have been present for the past year and have worsened recently.  He states that gabapentin has not been effective in alleviating his symptoms.  He is interested in medication adjustment today. -Increase gabapentin to 300 mg 3 times daily

## 2022-09-04 NOTE — Assessment & Plan Note (Signed)
Atorvastatin was increased to 80 mg daily as LDL remained above goal on his lipid panel from November 2023.  He has not experienced any adverse side effects with increasing atorvastatin. -Repeat lipid panel at next follow-up appointment

## 2022-09-13 ENCOUNTER — Ambulatory Visit: Payer: Medicare Other | Admitting: Internal Medicine

## 2022-09-17 ENCOUNTER — Telehealth: Payer: Self-pay | Admitting: Internal Medicine

## 2022-09-17 NOTE — Telephone Encounter (Signed)
Stephen Buckley [place], [place] order for med????

## 2022-09-17 NOTE — Telephone Encounter (Signed)
Spoke with Maudie Mercury and faxed d/c order over.

## 2022-10-09 ENCOUNTER — Other Ambulatory Visit: Payer: Self-pay | Admitting: Internal Medicine

## 2022-11-02 ENCOUNTER — Telehealth: Payer: Self-pay | Admitting: Internal Medicine

## 2022-11-02 NOTE — Telephone Encounter (Signed)
Called patient to schedule Medicare Annual Wellness Visit (AWV). Left message for patient to call back and schedule Medicare Annual Wellness Visit (AWV).  Last date of AWV: 11/11/2018   Please schedule an appointment at any time with Abby, NHA. .  If any questions, please contact me at [phone].  Thank you,  Judeth Cornfield,  AMB Clinical Support [facility] AWV Program Direct Dial ??[phone]

## 2022-11-05 ENCOUNTER — Other Ambulatory Visit: Payer: Self-pay | Admitting: Internal Medicine

## 2022-11-07 ENCOUNTER — Other Ambulatory Visit (HOSPITAL_COMMUNITY): Payer: Self-pay | Admitting: Psychiatry

## 2022-11-07 DIAGNOSIS — F319 Bipolar disorder, unspecified: Secondary | ICD-10-CM

## 2022-11-07 DIAGNOSIS — F09 Unspecified mental disorder due to known physiological condition: Secondary | ICD-10-CM

## 2022-11-08 ENCOUNTER — Telehealth (HOSPITAL_BASED_OUTPATIENT_CLINIC_OR_DEPARTMENT_OTHER): Payer: 59 | Admitting: Psychiatry

## 2022-11-08 ENCOUNTER — Encounter (HOSPITAL_COMMUNITY): Payer: Self-pay | Admitting: Psychiatry

## 2022-11-08 VITALS — Wt 196.0 lb

## 2022-11-08 DIAGNOSIS — S069X9S Unspecified intracranial injury with loss of consciousness of unspecified duration, sequela: Secondary | ICD-10-CM | POA: Diagnosis not present

## 2022-11-08 DIAGNOSIS — F319 Bipolar disorder, unspecified: Secondary | ICD-10-CM | POA: Diagnosis not present

## 2022-11-08 DIAGNOSIS — F02818 Dementia in other diseases classified elsewhere, unspecified severity, with other behavioral disturbance: Secondary | ICD-10-CM

## 2022-11-08 MED ORDER — CARBAMAZEPINE 200 MG PO TABS: ORAL_TABLET | ORAL | 2 refills | Status: DC

## 2022-11-08 MED ORDER — QUETIAPINE FUMARATE 200 MG PO TABS: 200.0000 mg | ORAL_TABLET | Freq: Every day | ORAL | 2 refills | Status: DC
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
## 2022-11-09 ENCOUNTER — Other Ambulatory Visit (HOSPITAL_COMMUNITY): Payer: Self-pay | Admitting: *Deleted

## 2022-11-09 ENCOUNTER — Telehealth (HOSPITAL_COMMUNITY): Payer: Self-pay | Admitting: *Deleted

## 2022-11-09 DIAGNOSIS — Z5181 Encounter for therapeutic drug level monitoring: Secondary | ICD-10-CM

## 2022-11-09 NOTE — Telephone Encounter (Signed)
Lab order for Tegretol level faxed [phone]) to [facility] after speaking with nurse who provided fax number.

## 2022-11-15 DIAGNOSIS — Z5181 Encounter for therapeutic drug level monitoring: Secondary | ICD-10-CM | POA: Diagnosis not present

## 2022-11-16 LAB — CARBAMAZEPINE LEVEL, TOTAL: Carbamazepine (Tegretol), S: 10.4 ug/mL (ref 4.0–12.0)

## 2022-11-21 ENCOUNTER — Telehealth: Payer: Self-pay

## 2022-11-21 DIAGNOSIS — T148XXA Other injury of unspecified body region, initial encounter: Secondary | ICD-10-CM | POA: Diagnosis not present

## 2022-11-21 DIAGNOSIS — W57XXXA Bitten or stung by nonvenomous insect and other nonvenomous arthropods, initial encounter: Secondary | ICD-10-CM | POA: Diagnosis not present

## 2022-11-21 NOTE — Telephone Encounter (Signed)
Called all numbers listed on chart. No answer on [phone] number. Person that answered on the [phone] gave me the number of [phone]. That number is an assisted living facility and he is a resident. Person that answered the phone stated he is a resident but he wasn't there at this time.

## 2022-12-04 ENCOUNTER — Other Ambulatory Visit: Payer: Self-pay | Admitting: Internal Medicine

## 2022-12-06 ENCOUNTER — Encounter: Payer: Self-pay | Admitting: Internal Medicine

## 2022-12-06 ENCOUNTER — Other Ambulatory Visit: Payer: Self-pay | Admitting: Internal Medicine

## 2022-12-06 ENCOUNTER — Ambulatory Visit (INDEPENDENT_AMBULATORY_CARE_PROVIDER_SITE_OTHER): Payer: 59 | Admitting: Internal Medicine

## 2022-12-06 VITALS — BP 132/74 | HR 80 | Ht 68.0 in | Wt 205.4 lb

## 2022-12-06 DIAGNOSIS — G629 Polyneuropathy, unspecified: Secondary | ICD-10-CM

## 2022-12-06 DIAGNOSIS — I639 Cerebral infarction, unspecified: Secondary | ICD-10-CM

## 2022-12-06 DIAGNOSIS — I10 Essential (primary) hypertension: Secondary | ICD-10-CM | POA: Diagnosis not present

## 2022-12-06 DIAGNOSIS — E785 Hyperlipidemia, unspecified: Secondary | ICD-10-CM

## 2022-12-06 NOTE — Patient Instructions (Signed)
It was a pleasure to see you today.  Thank you for giving [place] the opportunity to be involved in your care.  Below is a brief recap of your visit and next steps.  We will plan to see you again in 3 months.  Summary No medication changes today I recommend purchasing a cock-up wrist brace for the right wrist Be sure to use fluticasone nasal spray daily to help with eye irritation Repeat cholesterol panel Follow up in 3 months
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
## 2022-12-07 LAB — LIPID PANEL
Chol/HDL Ratio: 3.9 ratio (ref 0.0–5.0)
Cholesterol, Total: 134 mg/dL (ref 100–199)
HDL: 34 mg/dL — ABNORMAL LOW (ref >39)
LDL Chol Calc (NIH): 54 mg/dL (ref 0–99)
Triglycerides: 299 mg/dL — ABNORMAL HIGH (ref 0–149)
VLDL Cholesterol Cal: 46 mg/dL — ABNORMAL HIGH (ref 5–40)

## 2022-12-11 DIAGNOSIS — L608 Other nail disorders: Secondary | ICD-10-CM | POA: Diagnosis not present

## 2022-12-11 DIAGNOSIS — B351 Tinea unguium: Secondary | ICD-10-CM | POA: Diagnosis not present

## 2022-12-11 DIAGNOSIS — L851 Acquired keratosis [keratoderma] palmaris et plantaris: Secondary | ICD-10-CM | POA: Diagnosis not present

## 2022-12-11 DIAGNOSIS — R2689 Other abnormalities of gait and mobility: Secondary | ICD-10-CM | POA: Diagnosis not present

## 2022-12-11 DIAGNOSIS — M79675 Pain in left toe(s): Secondary | ICD-10-CM | POA: Diagnosis not present

## 2022-12-11 DIAGNOSIS — M79674 Pain in right toe(s): Secondary | ICD-10-CM | POA: Diagnosis not present

## 2022-12-13 NOTE — Assessment & Plan Note (Signed)
Lipid panel last updated in November and atorvastatin was increased to 80 mg as a result.  He continues to take atorvastatin 80 mg daily and has not experienced any adverse side effects. -Repeat lipid panel ordered today

## 2022-12-13 NOTE — Assessment & Plan Note (Signed)
BP remains adequately controlled with amlodipine 10 mg daily.  No medication adjustments are indicated today.

## 2022-12-13 NOTE — Assessment & Plan Note (Signed)
Symptoms have improved since increasing gabapentin 300 mg 3 times daily.  No additional changes have been made today.

## 2022-12-19 DIAGNOSIS — F32A Depression, unspecified: Secondary | ICD-10-CM | POA: Diagnosis not present

## 2022-12-19 DIAGNOSIS — Z79899 Other long term (current) drug therapy: Secondary | ICD-10-CM | POA: Diagnosis not present

## 2023-01-01 ENCOUNTER — Other Ambulatory Visit: Payer: Self-pay | Admitting: Internal Medicine

## 2023-01-02 ENCOUNTER — Ambulatory Visit (INDEPENDENT_AMBULATORY_CARE_PROVIDER_SITE_OTHER): Payer: 59

## 2023-01-02 VITALS — Ht 68.0 in | Wt 210.0 lb

## 2023-01-02 DIAGNOSIS — Z0100 Encounter for examination of eyes and vision without abnormal findings: Secondary | ICD-10-CM

## 2023-01-02 DIAGNOSIS — Z0000 Encounter for general adult medical examination without abnormal findings: Secondary | ICD-10-CM | POA: Diagnosis not present
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
## 2023-01-02 NOTE — Patient Instructions (Addendum)
Stephen Buckley , Thank you for taking time to come for your Medicare Wellness Visit. I appreciate your ongoing commitment to your health goals. Please review the following plan we discussed and let me know if I can assist you in the future.   These are the goals we discussed:  Goals       Exercise 2x per week (60 min per time)      MSW Care Coordination (pt-stated)      SW assisted patient with Medicaid application- Medicaid pending number is [id number]  SW assisted patient with applying for SSI. Patient will receive notification of approval or denial within 30-45 days.   SW requested patient to contact SW when a decision letter has  been received.   Patient wanting to go into a ALF. SW reviewed facilities with patients spouse. Spouse agreed to wait for Medicaid approval or denial before continuing search for ALF. SW dicussed process with family.       Quit smoking / using tobacco (pt-stated)      Patient smokes 3/4 PPD.         This is a list of the screening recommended for you and due dates:  Health Maintenance  Topic Date Due   Zoster (Shingles) Vaccine (2 of 2) 06/12/2021   DTaP/Tdap/Td vaccine (2 - Td or Tdap) 07/10/2021   COVID-19 Vaccine (6 - 2023-24 season) 02/23/2022   Flu Shot  01/24/2023   Screening for Lung Cancer  07/07/2023   Medicare Annual Wellness Visit  01/02/2024   Colon Cancer Screening  03/23/2024   Pneumonia Vaccine  Completed   Hepatitis C Screening  Completed   HPV Vaccine  Aged Out    Advanced directives: Advance directive discussed with you today. Even though you declined this today, please call our office should you change your mind, and we can give you the proper paperwork for you to fill out. Advance care planning is a way to make decisions about medical care that fits your values in case you are ever unable to make these decisions for yourself.  Information on Advanced Care Planning can be found at [facility] Advance Health  Care Directives Advance Health Care Directives ([email or web address]    Conditions/risks identified:  Steps to Quit Smoking Smoking tobacco is the leading cause of preventable death. It can affect almost every organ in the body. Smoking puts you and people around you at risk for many serious, long-lasting (chronic) diseases. Quitting smoking can be hard, but it is one of the best things that you can do for your health. It is never too late to quit. Do not give up if you cannot quit the first time. Some people need to try many times to quit. Do your best to stick to your quit plan, and talk with your doctor if you have any questions or concerns. How do I get ready to quit? Pick a date to quit. Set a date within the next 2 weeks to give you time to prepare. Write down the reasons why you are quitting. Keep this list in places where you will see it often. Tell your family, friends, and co-workers that you are quitting. Their support is important. Talk with your doctor about the choices that may help you quit. Find out if your health insurance will pay for these treatments. Know the people, places, things, and activities that make you want to smoke (triggers). Avoid them. What first steps can I take to quit smoking? Throw away all  cigarettes at home, at work, and in your car. Throw away the things that you use when you smoke, such as ashtrays and lighters. Clean your car. Empty the ashtray. Clean your home, including curtains and carpets. What can I do to help me quit smoking? Talk with your doctor about taking medicines and seeing a counselor. You are more likely to succeed when you do both. If you are pregnant or breastfeeding: Talk with your doctor about counseling or other ways to quit smoking. Do not take medicine to help you quit smoking unless your doctor tells you to. Quit right away Quit smoking completely, instead of slowly cutting back on how much you smoke over a period of time. Stopping  smoking right away may be more successful than slowly quitting. Go to counseling. In-person is best if this is an option. You are more likely to quit if you go to counseling sessions regularly. Take medicine You may take medicines to help you quit. Some medicines need a prescription, and some you can buy over-the-counter. Some medicines may contain a drug called nicotine to replace the nicotine in cigarettes. Medicines may: Help you stop having the desire to smoke (cravings). Help to stop the problems that come when you stop smoking (withdrawal symptoms). Your doctor may ask you to use: Nicotine patches, gum, or lozenges. Nicotine inhalers or sprays. Non-nicotine medicine that you take by mouth. Find resources Find resources and other ways to help you quit smoking and remain smoke-free after you quit. They include: Online chats with a Veterinary surgeon. Phone quitlines. Printed Materials engineer. Support groups or group counseling. Text messaging programs. Mobile phone apps. Use apps on your mobile phone or tablet that can help you stick to your quit plan. Examples of free services include Quit Guide from the CDC and smokefree.gov  What can I do to make it easier to quit?  Talk to your family and friends. Ask them to support and encourage you. Call a phone quitline, such as 1-800-QUIT-NOW, reach out to support groups, or work with a Veterinary surgeon. Ask people who smoke to not smoke around you. Avoid places that make you want to smoke, such as: Bars. Parties. Smoke-break areas at work. Spend time with people who do not smoke. Lower the stress in your life. Stress can make you want to smoke. Try these things to lower stress: Getting regular exercise. Doing deep-breathing exercises. Doing yoga. Meditating. What benefits will I see if I quit smoking? Over time, you may have: A better sense of smell and taste. Less coughing and sore throat. A slower heart rate. Lower blood pressure. Clearer  skin. Better breathing. Fewer sick days. Summary Quitting smoking can be hard, but it is one of the best things that you can do for your health. Do not give up if you cannot quit the first time. Some people need to try many times to quit. When you decide to quit smoking, make a plan to help you succeed. Quit smoking right away, not slowly over a period of time. When you start quitting, get help and support to keep you smoke-free. This information is not intended to replace advice given to you by your health care provider. Make sure you discuss any questions you have with your health care provider. Document Revised: 06/02/2021 Document Reviewed: 06/02/2021 Elsevier Patient Education  2024 Elsevier Inc.   You have been referred to [facility] for a complete eye exam. If you haven't heard from them in a few days, please call them  to schedule your appointment.  [facility] [address] [place] [address] Phone: [phone]    Next appointment: VIRTUAL/ TELEPHONE VISIT Follow up in one year for your annual wellness visit  February 12, 2024 at 10:30 am telephone visit   Preventive Care 16 Years and Older, Male  Preventive care refers to lifestyle choices and visits with your health care provider that can promote health and wellness. What does preventive care include? A yearly physical exam. This is also called an annual well check. Dental exams once or twice a year. Routine eye exams. Ask your health care provider how often you should have your eyes checked. Personal lifestyle choices, including: Daily care of your teeth and gums. Regular physical activity. Eating a healthy diet. Avoiding tobacco and drug use. Limiting alcohol use. Practicing safe sex. Taking low doses of aspirin every day. Taking vitamin and mineral supplements as recommended by your health care provider. What happens during an annual well check? The services and screenings done by your health  care provider during your annual well check will depend on your age, overall health, lifestyle risk factors, and family history of disease. Counseling  Your health care provider may ask you questions about your: Alcohol use. Tobacco use. Drug use. Emotional well-being. Home and relationship well-being. Sexual activity. Eating habits. History of falls. Memory and ability to understand (cognition). Work and work Astronomer. Screening  You may have the following tests or measurements: Height, weight, and BMI. Blood pressure. Lipid and cholesterol levels. These may be checked every 5 years, or more frequently if you are over 36 years old. Skin check. Lung cancer screening. You may have this screening every year starting at age 41 if you have a 30-pack-year history of smoking and currently smoke or have quit within the past 15 years. Fecal occult blood test (FOBT) of the stool. You may have this test every year starting at age 27. Flexible sigmoidoscopy or colonoscopy. You may have a sigmoidoscopy every 5 years or a colonoscopy every 10 years starting at age 84. Prostate cancer screening. Recommendations will vary depending on your family history and other risks. Hepatitis C blood test. Hepatitis B blood test. Sexually transmitted disease (STD) testing. Diabetes screening. This is done by checking your blood sugar (glucose) after you have not eaten for a while (fasting). You may have this done every 1-3 years. Abdominal aortic aneurysm (AAA) screening. You may need this if you are a current or former smoker. Osteoporosis. You may be screened starting at age 74 if you are at high risk. Talk with your health care provider about your test results, treatment options, and if necessary, the need for more tests. Vaccines  Your health care provider may recommend certain vaccines, such as: Influenza vaccine. This is recommended every year. Tetanus, diphtheria, and acellular pertussis (Tdap, Td)  vaccine. You may need a Td booster every 10 years. Zoster vaccine. You may need this after age 78. Pneumococcal 13-valent conjugate (PCV13) vaccine. One dose is recommended after age 46. Pneumococcal polysaccharide (PPSV23) vaccine. One dose is recommended after age 58. Talk to your health care provider about which screenings and vaccines you need and how often you need them. This information is not intended to replace advice given to you by your health care provider. Make sure you discuss any questions you have with your health care provider. Document Released: 07/08/2015 Document Revised: 02/29/2016 Document Reviewed: 04/12/2015 Elsevier Interactive Patient Education  2017 [employer].  Fall Prevention in  the Home Falls can cause injuries. They can happen to people of all ages. There are many things you can do to make your home safe and to help prevent falls. What can I do on the outside of my home? Regularly fix the edges of walkways and driveways and fix any cracks. Remove anything that might make you trip as you walk through a door, such as a raised step or threshold. Trim any bushes or trees on the path to your home. Use bright outdoor lighting. Clear any walking paths of anything that might make someone trip, such as rocks or tools. Regularly check to see if handrails are loose or broken. Make sure that both sides of any steps have handrails. Any raised decks and porches should have guardrails on the edges. Have any leaves, snow, or ice cleared regularly. Use sand or salt on walking paths during winter. Clean up any spills in your garage right away. This includes oil or grease spills. What can I do in the bathroom? Use night lights. Install grab bars by the toilet and in the tub and shower. Do not use towel bars as grab bars. Use non-skid mats or decals in the tub or shower. If you need to sit down in the shower, use a plastic, non-slip stool. Keep the floor dry. Clean up any  water that spills on the floor as soon as it happens. Remove soap buildup in the tub or shower regularly. Attach bath mats securely with double-sided non-slip rug tape. Do not have throw rugs and other things on the floor that can make you trip. What can I do in the bedroom? Use night lights. Make sure that you have a light by your bed that is easy to reach. Do not use any sheets or blankets that are too big for your bed. They should not hang down onto the floor. Have a firm chair that has side arms. You can use this for support while you get dressed. Do not have throw rugs and other things on the floor that can make you trip. What can I do in the kitchen? Clean up any spills right away. Avoid walking on wet floors. Keep items that you use a lot in easy-to-reach places. If you need to reach something above you, use a strong step stool that has a grab bar. Keep electrical cords out of the way. Do not use floor polish or wax that makes floors slippery. If you must use wax, use non-skid floor wax. Do not have throw rugs and other things on the floor that can make you trip. What can I do with my stairs? Do not leave any items on the stairs. Make sure that there are handrails on both sides of the stairs and use them. Fix handrails that are broken or loose. Make sure that handrails are as long as the stairways. Check any carpeting to make sure that it is firmly attached to the stairs. Fix any carpet that is loose or worn. Avoid having throw rugs at the top or bottom of the stairs. If you do have throw rugs, attach them to the floor with carpet tape. Make sure that you have a light switch at the top of the stairs and the bottom of the stairs. If you do not have them, ask someone to add them for you. What else can I do to help prevent falls? Wear shoes that: Do not have high heels. Have rubber bottoms. Are comfortable and fit you well. Are closed  at the toe. Do not wear sandals. If you use a  stepladder: Make sure that it is fully opened. Do not climb a closed stepladder. Make sure that both sides of the stepladder are locked into place. Ask someone to hold it for you, if possible. Clearly mark and make sure that you can see: Any grab bars or handrails. First and last steps. Where the edge of each step is. Use tools that help you move around (mobility aids) if they are needed. These include: Canes. Walkers. Scooters. Crutches. Turn on the lights when you go into a dark area. Replace any light bulbs as soon as they burn out. Set up your furniture so you have a clear path. Avoid moving your furniture around. If any of your floors are uneven, fix them. If there are any pets around you, be aware of where they are. Review your medicines with your doctor. Some medicines can make you feel dizzy. This can increase your chance of falling. Ask your doctor what other things that you can do to help prevent falls. This information is not intended to replace advice given to you by your health care provider. Make sure you discuss any questions you have with your health care provider. Document Released: 04/07/2009 Document Revised: 11/17/2015 Document Reviewed: 07/16/2014 Elsevier Interactive Patient Education  2017 [employer].

## 2023-01-10 ENCOUNTER — Encounter: Payer: Self-pay | Admitting: Internal Medicine

## 2023-01-10 ENCOUNTER — Ambulatory Visit (INDEPENDENT_AMBULATORY_CARE_PROVIDER_SITE_OTHER): Payer: 59 | Admitting: Internal Medicine

## 2023-01-10 VITALS — BP 147/78 | HR 95 | Ht 68.0 in | Wt 201.6 lb

## 2023-01-10 DIAGNOSIS — M7918 Myalgia, other site: Secondary | ICD-10-CM | POA: Diagnosis not present

## 2023-01-10 DIAGNOSIS — H04123 Dry eye syndrome of bilateral lacrimal glands: Secondary | ICD-10-CM

## 2023-01-10 DIAGNOSIS — G8929 Other chronic pain: Secondary | ICD-10-CM | POA: Diagnosis not present

## 2023-01-10 DIAGNOSIS — J302 Other seasonal allergic rhinitis: Secondary | ICD-10-CM | POA: Diagnosis not present

## 2023-01-10 DIAGNOSIS — N401 Enlarged prostate with lower urinary tract symptoms: Secondary | ICD-10-CM

## 2023-01-10 DIAGNOSIS — R338 Other retention of urine: Secondary | ICD-10-CM

## 2023-01-10 DIAGNOSIS — M25551 Pain in right hip: Secondary | ICD-10-CM | POA: Diagnosis not present

## 2023-01-10 MED ORDER — ACETAMINOPHEN 500 MG PO TABS
500.0000 mg | ORAL_TABLET | ORAL | 1 refills | Status: AC | PRN
Start: 2023-01-10 — End: ?

## 2023-01-10 MED ORDER — CARBOXYMETHYLCELLULOSE SODIUM 1 % OP SOLN: 1.0000 [drp] | OPHTHALMIC | 12 refills | Status: DC | PRN

## 2023-01-10 NOTE — Assessment & Plan Note (Addendum)
He is prescribed Flomax for treatment of BPH.  Today he endorses urinary urgency and frequency.  Symptoms have not changed recently.  Of note, he has a previously documented history of frequency-urgency syndrome. -No medication changes have been made today
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
## 2023-01-10 NOTE — Patient Instructions (Signed)
It was a pleasure to see you today.  Thank you for giving [place] the opportunity to be involved in your care.  Below is a brief recap of your visit and next steps.  We will plan to see you again in 3 months.  Summary Add tylenol 500 mg every 4 hours as needed for pain relief Eye drops refilled

## 2023-01-10 NOTE — Assessment & Plan Note (Signed)
Chronic issue.  He continues to endorse eye irritation.  I have refilled refresh eyedrops today.

## 2023-01-16 DIAGNOSIS — Z79899 Other long term (current) drug therapy: Secondary | ICD-10-CM | POA: Diagnosis not present

## 2023-01-16 DIAGNOSIS — F32A Depression, unspecified: Secondary | ICD-10-CM | POA: Diagnosis not present

## 2023-01-22 DIAGNOSIS — Z79899 Other long term (current) drug therapy: Secondary | ICD-10-CM | POA: Diagnosis not present

## 2023-01-30 ENCOUNTER — Other Ambulatory Visit (HOSPITAL_COMMUNITY): Payer: Self-pay | Admitting: Psychiatry

## 2023-01-30 ENCOUNTER — Other Ambulatory Visit: Payer: Self-pay | Admitting: Internal Medicine

## 2023-01-30 DIAGNOSIS — F02818 Dementia in other diseases classified elsewhere, unspecified severity, with other behavioral disturbance: Secondary | ICD-10-CM

## 2023-01-30 DIAGNOSIS — F319 Bipolar disorder, unspecified: Secondary | ICD-10-CM

## 2023-02-07 ENCOUNTER — Telehealth (HOSPITAL_BASED_OUTPATIENT_CLINIC_OR_DEPARTMENT_OTHER): Payer: 59 | Admitting: Psychiatry

## 2023-02-07 ENCOUNTER — Encounter (HOSPITAL_COMMUNITY): Payer: Self-pay | Admitting: Psychiatry

## 2023-02-07 VITALS — Wt 201.0 lb

## 2023-02-07 DIAGNOSIS — F02818 Dementia in other diseases classified elsewhere, unspecified severity, with other behavioral disturbance: Secondary | ICD-10-CM | POA: Diagnosis not present

## 2023-02-07 DIAGNOSIS — S069X9S Unspecified intracranial injury with loss of consciousness of unspecified duration, sequela: Secondary | ICD-10-CM | POA: Diagnosis not present

## 2023-02-07 DIAGNOSIS — F319 Bipolar disorder, unspecified: Secondary | ICD-10-CM | POA: Diagnosis not present

## 2023-02-07 MED ORDER — QUETIAPINE FUMARATE 200 MG PO TABS
200.0000 mg | ORAL_TABLET | Freq: Every day | ORAL | 2 refills | Status: DC
Start: 2023-02-07 — End: 2023-05-10

## 2023-02-07 MED ORDER — CARBAMAZEPINE 200 MG PO TABS
ORAL_TABLET | ORAL | 2 refills | Status: DC
Start: 2023-02-07 — End: 2023-05-10
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
## 2023-02-19 DIAGNOSIS — H02105 Unspecified ectropion of left lower eyelid: Secondary | ICD-10-CM | POA: Diagnosis not present

## 2023-02-19 DIAGNOSIS — H2513 Age-related nuclear cataract, bilateral: Secondary | ICD-10-CM | POA: Diagnosis not present

## 2023-02-19 DIAGNOSIS — H25013 Cortical age-related cataract, bilateral: Secondary | ICD-10-CM | POA: Diagnosis not present

## 2023-02-20 DIAGNOSIS — F32A Depression, unspecified: Secondary | ICD-10-CM | POA: Diagnosis not present

## 2023-02-20 DIAGNOSIS — Z79899 Other long term (current) drug therapy: Secondary | ICD-10-CM | POA: Diagnosis not present

## 2023-02-28 DIAGNOSIS — R2689 Other abnormalities of gait and mobility: Secondary | ICD-10-CM | POA: Diagnosis not present

## 2023-02-28 DIAGNOSIS — M79675 Pain in left toe(s): Secondary | ICD-10-CM | POA: Diagnosis not present

## 2023-02-28 DIAGNOSIS — B070 Plantar wart: Secondary | ICD-10-CM | POA: Diagnosis not present

## 2023-02-28 DIAGNOSIS — L603 Nail dystrophy: Secondary | ICD-10-CM | POA: Diagnosis not present

## 2023-02-28 DIAGNOSIS — M79674 Pain in right toe(s): Secondary | ICD-10-CM | POA: Diagnosis not present

## 2023-02-28 DIAGNOSIS — B351 Tinea unguium: Secondary | ICD-10-CM | POA: Diagnosis not present

## 2023-02-28 DIAGNOSIS — L851 Acquired keratosis [keratoderma] palmaris et plantaris: Secondary | ICD-10-CM | POA: Diagnosis not present

## 2023-03-05 ENCOUNTER — Other Ambulatory Visit: Payer: Self-pay | Admitting: Internal Medicine

## 2023-03-15 ENCOUNTER — Ambulatory Visit: Payer: 59 | Admitting: Internal Medicine

## 2023-03-15 ENCOUNTER — Encounter: Payer: Self-pay | Admitting: Internal Medicine

## 2023-03-15 VITALS — BP 139/81 | HR 85 | Ht 68.0 in | Wt 205.4 lb

## 2023-03-15 DIAGNOSIS — H04123 Dry eye syndrome of bilateral lacrimal glands: Secondary | ICD-10-CM | POA: Diagnosis not present

## 2023-03-15 DIAGNOSIS — Z23 Encounter for immunization: Secondary | ICD-10-CM

## 2023-03-15 DIAGNOSIS — M79642 Pain in left hand: Secondary | ICD-10-CM

## 2023-03-15 DIAGNOSIS — M79641 Pain in right hand: Secondary | ICD-10-CM | POA: Diagnosis not present

## 2023-03-15 MED ORDER — DICLOFENAC SODIUM 1 % EX GEL
1.0000 | Freq: Four times a day (QID) | CUTANEOUS | 3 refills | Status: DC
Start: 2023-03-15 — End: 2024-02-13

## 2023-03-15 NOTE — Patient Instructions (Signed)
It was a pleasure to see you today.  Thank you for giving [place] the opportunity to be involved in your care.  Below is a brief recap of your visit and next steps.  We will plan to see you again in 3 months.  Summary I recommend using Refresh eye drops in addition to Olpatadine drops for relief of eye irritation We will follow up on the previously placed ophthalmology referral I recommend voltaren gel for relief of finger pain Follow up in 3 months Flu shot today

## 2023-03-20 DIAGNOSIS — Z79899 Other long term (current) drug therapy: Secondary | ICD-10-CM | POA: Diagnosis not present

## 2023-03-25 ENCOUNTER — Encounter: Payer: Self-pay | Admitting: Internal Medicine

## 2023-03-25 DIAGNOSIS — M79641 Pain in right hand: Secondary | ICD-10-CM | POA: Insufficient documentation

## 2023-03-25 DIAGNOSIS — Z23 Encounter for immunization: Secondary | ICD-10-CM | POA: Insufficient documentation

## 2023-03-25 NOTE — Assessment & Plan Note (Addendum)
His acute concern today remains bilateral eye irritation.  He is currently prescribed olopatadine and refresh eyedrops, but is only receiving olopatadine drops at his facility.  He has previously been referred to ophthalmology. -No additional changes today.  Ophthalmology appointment pending.  I have requested that Mr. Stephen Buckley's facility administer refresh eyedrops as previously prescribed.

## 2023-03-25 NOTE — Assessment & Plan Note (Signed)
He endorses pain over the DIP joints of each hand.  Suspect underlying arthritis.  Voltaren gel has been prescribed today for as needed pain relief.

## 2023-03-25 NOTE — Assessment & Plan Note (Signed)
Influenza vaccine administered today.

## 2023-03-31 ENCOUNTER — Other Ambulatory Visit: Payer: Self-pay | Admitting: Internal Medicine

## 2023-03-31 DIAGNOSIS — G629 Polyneuropathy, unspecified: Secondary | ICD-10-CM

## 2023-04-01 ENCOUNTER — Other Ambulatory Visit: Payer: Self-pay | Admitting: Neurology

## 2023-04-17 DIAGNOSIS — Z79899 Other long term (current) drug therapy: Secondary | ICD-10-CM | POA: Diagnosis not present

## 2023-04-26 ENCOUNTER — Other Ambulatory Visit: Payer: Self-pay | Admitting: Internal Medicine

## 2023-05-06 ENCOUNTER — Other Ambulatory Visit: Payer: Self-pay | Admitting: Internal Medicine

## 2023-05-06 DIAGNOSIS — G629 Polyneuropathy, unspecified: Secondary | ICD-10-CM

## 2023-05-10 ENCOUNTER — Telehealth (HOSPITAL_BASED_OUTPATIENT_CLINIC_OR_DEPARTMENT_OTHER): Payer: 59 | Admitting: Psychiatry

## 2023-05-10 ENCOUNTER — Encounter (HOSPITAL_COMMUNITY): Payer: Self-pay | Admitting: Psychiatry

## 2023-05-10 VITALS — Wt 205.0 lb

## 2023-05-10 DIAGNOSIS — F319 Bipolar disorder, unspecified: Secondary | ICD-10-CM | POA: Diagnosis not present

## 2023-05-10 DIAGNOSIS — S069X9S Unspecified intracranial injury with loss of consciousness of unspecified duration, sequela: Secondary | ICD-10-CM

## 2023-05-10 DIAGNOSIS — F02818 Dementia in other diseases classified elsewhere, unspecified severity, with other behavioral disturbance: Secondary | ICD-10-CM

## 2023-05-10 MED ORDER — CARBAMAZEPINE 200 MG PO TABS
ORAL_TABLET | ORAL | 2 refills | Status: DC
Start: 2023-05-10 — End: 2024-02-15

## 2023-05-10 MED ORDER — QUETIAPINE FUMARATE 200 MG PO TABS
200.0000 mg | ORAL_TABLET | Freq: Every day | ORAL | 2 refills | Status: DC
Start: 2023-05-10 — End: 2024-02-15
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
## 2023-05-16 DIAGNOSIS — L84 Corns and callosities: Secondary | ICD-10-CM | POA: Diagnosis not present

## 2023-05-16 DIAGNOSIS — M79675 Pain in left toe(s): Secondary | ICD-10-CM | POA: Diagnosis not present

## 2023-05-16 DIAGNOSIS — B070 Plantar wart: Secondary | ICD-10-CM | POA: Diagnosis not present

## 2023-05-16 DIAGNOSIS — L608 Other nail disorders: Secondary | ICD-10-CM | POA: Diagnosis not present

## 2023-05-16 DIAGNOSIS — M79674 Pain in right toe(s): Secondary | ICD-10-CM | POA: Diagnosis not present

## 2023-05-16 DIAGNOSIS — B351 Tinea unguium: Secondary | ICD-10-CM | POA: Diagnosis not present

## 2023-05-16 DIAGNOSIS — R2689 Other abnormalities of gait and mobility: Secondary | ICD-10-CM | POA: Diagnosis not present

## 2023-05-20 ENCOUNTER — Telehealth: Payer: Self-pay | Admitting: Neurology

## 2023-05-20 NOTE — Telephone Encounter (Signed)
P's wife call to reschedule appt due to will be out  of town.

## 2023-05-22 ENCOUNTER — Ambulatory Visit: Payer: 59 | Admitting: Neurology

## 2023-05-22 DIAGNOSIS — Z79899 Other long term (current) drug therapy: Secondary | ICD-10-CM | POA: Diagnosis not present

## 2023-06-03 ENCOUNTER — Other Ambulatory Visit: Payer: Self-pay | Admitting: Internal Medicine

## 2023-06-03 DIAGNOSIS — G629 Polyneuropathy, unspecified: Secondary | ICD-10-CM

## 2023-06-14 ENCOUNTER — Ambulatory Visit: Payer: 59 | Admitting: Internal Medicine

## 2023-06-21 DIAGNOSIS — Z79899 Other long term (current) drug therapy: Secondary | ICD-10-CM | POA: Diagnosis not present

## 2023-06-24 DIAGNOSIS — Z79899 Other long term (current) drug therapy: Secondary | ICD-10-CM | POA: Diagnosis not present

## 2023-06-25 ENCOUNTER — Encounter: Payer: Self-pay | Admitting: Internal Medicine

## 2023-07-03 ENCOUNTER — Other Ambulatory Visit: Payer: Self-pay | Admitting: Internal Medicine

## 2023-07-04 ENCOUNTER — Other Ambulatory Visit: Payer: Self-pay | Admitting: Internal Medicine

## 2023-07-04 DIAGNOSIS — G629 Polyneuropathy, unspecified: Secondary | ICD-10-CM

## 2023-07-08 ENCOUNTER — Other Ambulatory Visit: Payer: Self-pay | Admitting: Internal Medicine

## 2023-07-24 DIAGNOSIS — Z79899 Other long term (current) drug therapy: Secondary | ICD-10-CM | POA: Diagnosis not present

## 2023-08-04 ENCOUNTER — Other Ambulatory Visit: Payer: Self-pay | Admitting: Internal Medicine

## 2023-08-04 DIAGNOSIS — G629 Polyneuropathy, unspecified: Secondary | ICD-10-CM

## 2023-08-04 DIAGNOSIS — J302 Other seasonal allergic rhinitis: Secondary | ICD-10-CM

## 2023-08-05 ENCOUNTER — Ambulatory Visit: Payer: 59 | Admitting: Physician Assistant

## 2023-08-05 ENCOUNTER — Encounter: Payer: Self-pay | Admitting: Physician Assistant

## 2023-08-05 ENCOUNTER — Ambulatory Visit: Payer: Self-pay | Admitting: Internal Medicine

## 2023-08-05 ENCOUNTER — Ambulatory Visit (HOSPITAL_COMMUNITY)
Admission: RE | Admit: 2023-08-05 | Discharge: 2023-08-05 | Disposition: A | Discharge status: Home / Self Care | Payer: 59 | Source: Ambulatory Visit | Attending: Physician Assistant | Admitting: Physician Assistant

## 2023-08-05 VITALS — BP 132/84 | HR 90 | Temp 98.1°F | Ht 68.0 in | Wt 218.0 lb

## 2023-08-05 DIAGNOSIS — M79671 Pain in right foot: Secondary | ICD-10-CM

## 2023-08-05 DIAGNOSIS — M25571 Pain in right ankle and joints of right foot: Secondary | ICD-10-CM | POA: Diagnosis not present

## 2023-08-05 DIAGNOSIS — M2011 Hallux valgus (acquired), right foot: Secondary | ICD-10-CM | POA: Diagnosis not present

## 2023-08-05 NOTE — Progress Notes (Signed)
 Acute Office Visit  Subjective:     Patient ID: Stephen Buckley, male    DOB: September 10, 1950, 73 y.o.   MRN: [id number]   HPI Patient is in today for right foot pain. Patient is poor historian due to previous TBI resulting in memory deficits. Per treat has not pain began on Thursday.  Today patient complaining of pain located to the right proximal dorsal aspect of his foot, radiating to lateral right ankle, and complaints of pain in the back of his foot above his heel.  He states pain is intermittent in and describes pain as a numbness with instances of sharp pain.  He relates pain is worse while weightbearing and worse in the morning.  He denies taking any medication for pain relief.  She denies recent injury or falls.  He does describe history of ankle and knee surgery resulting in pins over 20 years ago.   Review of Systems  Musculoskeletal:  Positive for joint pain. Negative for falls and myalgias.  Skin:  Negative for itching and rash.  Neurological:  Negative for focal weakness, seizures and loss of consciousness.  Psychiatric/Behavioral:  Positive for memory loss.         Objective:     BP 132/84   Pulse 90   Temp 98.1 F (36.7 C)   Ht 5\' 8"  (1.727 m)   Wt 218 lb (98.9 kg)   SpO2 97%   BMI 33.15 kg/m   Physical Exam Constitutional:      Appearance: Normal appearance.  HENT:     Head: Normocephalic.     Mouth/Throat:     Mouth: Mucous membranes are moist.     Pharynx: Oropharynx is clear.  Eyes:     Extraocular Movements: Extraocular movements intact.     Conjunctiva/sclera: Conjunctivae normal.  Cardiovascular:     Rate and Rhythm: Normal rate and regular rhythm.     Heart sounds: No murmur heard.    No gallop.  Pulmonary:     Effort: Pulmonary effort is normal.     Breath sounds: No wheezing, rhonchi or rales.  Musculoskeletal:     Right lower leg: No edema.     Left lower leg: No edema.     Right foot: Tenderness (mild tenderness of BLmalleoli)  present. No deformity, bunion or bony tenderness. Normal pulse.  Feet:     Right foot:     Skin integrity: Callus and dry skin present. No ulcer or blister.  Skin:    General: Skin is warm and dry.  Neurological:     General: No focal deficit present.     Mental Status: He is alert. Mental status is at baseline.  Psychiatric:        Mood and Affect: Mood normal.        Behavior: Behavior normal.     DG Foot Complete Right CLINICAL DATA:  Lateral malleolar tenderness. Remote right ankle surgeries.  EXAM: RIGHT FOOT COMPLETE - 3+ VIEW  COMPARISON:  Right tibia and fibula radiographs 03/24/2012  FINDINGS: There is diffuse decreased bone mineralization. Mild hallux valgus. Remote screw fixation of the medial malleolus. Partial visualization of distal tibial intramedullary nail with two distal transverse interlocking screws. Centrally lucent and peripherally sclerotic densities just dorsal to the intramedullary nail unchanged from 2013 and chronic, not significantly changed. Mild anterior ankle vascular calcifications.  IMPRESSION: 1. No acute fracture. 2. Mild hallux valgus. 3. Remote screw fixation of the medial malleolus.  Electronically Signed   By: Currie Douse  Viola M.D.   On: 08/05/2023 14:58       Assessment & Plan:  Right foot pain -     DG Foot Complete Right   Patient appears stable today. Physical exam overall without significant findings. Considered, but doubt, acute fracture including open fracture. Low index of suspicion for a dislocation. Doubt other acute causes of pain at this time. However, due to uncertain nature of history from patient, right foot XR today to rule out acute injury. Advised patient orthotic shoe inserts for cushion and support. Pain control with tylenol  as needed. Weightbearing and activity as tolerated. Follow up with PCP if symptoms do not improve or worsen.  Normal foot x-ray. No acute fractures of injuries.   Return with Dr. Kermit Ped  if symptoms worsen or do not improve.  Jearlean Mince Haydyn Girvan, PA-C

## 2023-08-05 NOTE — Telephone Encounter (Signed)
  Chief Complaint: Foot pain Symptoms: Pain in bilat feed Frequency: Constant Pertinent Negatives: Patient denies Injury, redness, swelling, numbness/weakness, pain radiation Disposition: [] ED /[] Urgent Care (no appt availability in office) / [x] Appointment(In office/virtual)/ []  [facility]/ [] Home Care/ [] Refused Recommended Disposition /[] [facility]/ []  Follow-up with PCP Additional Notes: Patient's med tech Burdette Carolin called about patient having complaints of severe foot pain. Kim/pt states that patient's symptoms stated on Thursday and has progressively gotten worse where now the pain is severe and affecting mobility. Burdette Carolin states that patient has been taking tylenol  and doing foot soaks but with no real relief, pain is at a 10/10. Kim denies injury, numbness/weakness, redness, radiation or swelling to feet. Patient using cane to walk when he normally only uses it seldomly. Kim advised by this RN per protocol that patient should be seen within the next four hours, to which Kim/pt was agreeable. Appt scheduled. Burdette Carolin was advised by this RN to call back with worsening symptoms. Kim verbalized understanding.  Copied from CRM [id number]. Topic: Appointments - Appointment Scheduling >> Aug 05, 2023  9:41 AM Georgeann Kindred wrote: Patient/patient representative is calling to schedule an appointment. Refer to attachments for appointment information. Patient is in a severe amount of pain in his feet with the Right being the worse. When asked on a scale of 1-10 how bad the pain is it was stated a 10. Patient has pulled out a cain to assist with his walking. Reason for Disposition  [1] SEVERE pain (e.g., excruciating, unable to do any normal activities) AND [2] not improved after 2 hours of pain medicine  Answer Assessment - Initial Assessment Questions 1. ONSET: "When did the pain start?"      Last Thursday 2. LOCATION: "Where is the pain located?"      Bilat feet 3. PAIN: "How bad is the pain?"     (Scale 1-10; or mild, moderate, severe)   -  MILD (1-3): doesn't interfere with normal activities    -  MODERATE (4-7): interferes with normal activities (e.g., work or school) or awakens from sleep, limping    -  SEVERE (8-10): excruciating pain, unable to do any normal activities, unable to walk     10 "He's walking with a cane now, he normally dont walk with one like this." 4. WORK OR EXERCISE: "Has there been any recent work or exercise that involved this part of the body?"      Denies 5. CAUSE: "What do you think is causing the leg pain?"     "No injury, he just said it started hurting." 6. OTHER SYMPTOMS: "Do you have any other symptoms?" (e.g., chest pain, back pain, breathing difficulty, swelling, rash, fever, numbness, weakness)     Denies  Protocols used: Leg Pain-A-AH

## 2023-08-08 ENCOUNTER — Ambulatory Visit (HOSPITAL_COMMUNITY): Payer: 59 | Admitting: Psychiatry

## 2023-08-15 ENCOUNTER — Ambulatory Visit: Payer: Self-pay | Admitting: Internal Medicine

## 2023-08-21 DIAGNOSIS — Z79899 Other long term (current) drug therapy: Secondary | ICD-10-CM | POA: Diagnosis not present

## 2023-08-22 ENCOUNTER — Ambulatory Visit: Payer: Self-pay | Admitting: Internal Medicine

## 2023-08-22 DIAGNOSIS — H02105 Unspecified ectropion of left lower eyelid: Secondary | ICD-10-CM | POA: Diagnosis not present

## 2023-08-22 DIAGNOSIS — H2513 Age-related nuclear cataract, bilateral: Secondary | ICD-10-CM | POA: Diagnosis not present

## 2023-08-22 DIAGNOSIS — H25013 Cortical age-related cataract, bilateral: Secondary | ICD-10-CM | POA: Diagnosis not present

## 2023-08-22 NOTE — Telephone Encounter (Signed)
 Copied from CRM [id number]. Topic: Clinical - Red Word Triage >> Aug 22, 2023 11:40 AM Dennison Nancy wrote: Red Word that prompted transfer to Nurse Triage:  right ankle has 2 pins in it and hard time walking and having stiffness and pain with swelling   Chief Complaint: Ankle pain Symptoms: Right ankle pain, minor swelling Frequency: Intermittent  Disposition: [] ED /[] Urgent Care (no appt availability in office) / [x] Appointment(In office/virtual)/ []  [facility]/ [] Home Care/ [] Refused Recommended Disposition /[] [facility]/ []  Follow-up with PCP Additional Notes: Patient reports he has had some intermittent right ankle pain for the last 2 week. He states with his pain he has also had some minor swelling to the ankle. He denies any known injury. He states that he has had a previous injury to the ankle and has 2 pins in it. There are no appointment at his PCP's office so I offered him an appointment at another office. He is agreeable with this but states he will call back to schedule when he is able to write down the address of an available clinic to be seen at.     Reason for Disposition  [1] MODERATE pain (e.g., interferes with normal activities, limping) AND [2] present > 3 days  Answer Assessment - Initial Assessment Questions 1. ONSET: "When did the pain start?"      2 weeks  2. LOCATION: "Where is the pain located?"      Right 3. PAIN: "How bad is the pain?"    (Scale 1-10; or mild, moderate, severe)  - MILD (1-3): doesn't interfere with normal activities.   - MODERATE (4-7): interferes with normal activities (e.g., work or school) or awakens from sleep, limping.   - SEVERE (8-10): excruciating pain, unable to do any normal activities, unable to walk.      5/10 when walking  4. WORK OR EXERCISE: "Has there been any recent work or exercise that involved this part of the body?"      No 5. CAUSE: "What do you think is causing the ankle pain?"     Unsure, has 2 pins in  ankle from previous injury  6. OTHER SYMPTOMS: "Do you have any other symptoms?" (e.g., calf pain, rash, fever, swelling)     Swelling of ankle  Protocols used: Ankle Pain-A-AH

## 2023-08-28 ENCOUNTER — Ambulatory Visit: Payer: Self-pay | Admitting: Internal Medicine

## 2023-08-28 NOTE — Telephone Encounter (Signed)
 Reason for Disposition . Third attempt to contact caller AND no contact made. Phone number verified.  Answer Assessment - Initial Assessment Questions N/A Unable to reach patient for triage.  Protocols used: No Contact or Duplicate Contact Call-A-AH

## 2023-08-28 NOTE — Telephone Encounter (Addendum)
 This RN third attempt to contact patient for triage. No answer, unable to leave voicemail. Forwarding to clinic for follow up.   This RN second attempt to contact patient for triage. No answer, unable to leave voicemail. Placed for call back.  This RN attempted to contact patient for triage. No answer, unable to leave voicemail. Placed for call back.

## 2023-08-29 ENCOUNTER — Other Ambulatory Visit: Payer: Self-pay | Admitting: Internal Medicine

## 2023-08-29 DIAGNOSIS — G629 Polyneuropathy, unspecified: Secondary | ICD-10-CM

## 2023-08-30 ENCOUNTER — Other Ambulatory Visit: Payer: Self-pay | Admitting: Internal Medicine

## 2023-08-30 DIAGNOSIS — I10 Essential (primary) hypertension: Secondary | ICD-10-CM

## 2023-09-04 ENCOUNTER — Ambulatory Visit: Admitting: Physician Assistant

## 2023-09-04 DIAGNOSIS — H02105 Unspecified ectropion of left lower eyelid: Secondary | ICD-10-CM | POA: Diagnosis not present

## 2023-09-11 DIAGNOSIS — L603 Nail dystrophy: Secondary | ICD-10-CM | POA: Diagnosis not present

## 2023-09-11 DIAGNOSIS — M79674 Pain in right toe(s): Secondary | ICD-10-CM | POA: Diagnosis not present

## 2023-09-11 DIAGNOSIS — B351 Tinea unguium: Secondary | ICD-10-CM | POA: Diagnosis not present

## 2023-09-11 DIAGNOSIS — L84 Corns and callosities: Secondary | ICD-10-CM | POA: Diagnosis not present

## 2023-09-11 DIAGNOSIS — R2681 Unsteadiness on feet: Secondary | ICD-10-CM | POA: Diagnosis not present

## 2023-09-11 DIAGNOSIS — M79675 Pain in left toe(s): Secondary | ICD-10-CM | POA: Diagnosis not present

## 2023-09-16 ENCOUNTER — Other Ambulatory Visit: Payer: Self-pay | Admitting: Internal Medicine

## 2023-09-18 DIAGNOSIS — Z79899 Other long term (current) drug therapy: Secondary | ICD-10-CM | POA: Diagnosis not present

## 2023-09-19 ENCOUNTER — Telehealth: Payer: Self-pay

## 2023-09-19 DIAGNOSIS — Z79899 Other long term (current) drug therapy: Secondary | ICD-10-CM | POA: Diagnosis not present
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
## 2023-09-19 NOTE — Telephone Encounter (Signed)
 scheduled

## 2023-09-24 ENCOUNTER — Ambulatory Visit

## 2023-09-24 ENCOUNTER — Other Ambulatory Visit (HOSPITAL_COMMUNITY): Payer: Self-pay | Admitting: Psychiatry

## 2023-09-24 VITALS — BP 135/74 | HR 102 | Ht 69.0 in | Wt 216.0 lb

## 2023-09-24 DIAGNOSIS — M25571 Pain in right ankle and joints of right foot: Secondary | ICD-10-CM | POA: Diagnosis not present

## 2023-09-24 DIAGNOSIS — Z8781 Personal history of (healed) traumatic fracture: Secondary | ICD-10-CM | POA: Diagnosis not present

## 2023-09-24 DIAGNOSIS — F319 Bipolar disorder, unspecified: Secondary | ICD-10-CM

## 2023-09-24 NOTE — Patient Instructions (Signed)
 Will obtain x-ray for further evaluation.   Referral to orthopedic specialist placed for further evaluation and treatment.   Tylenol as needed for pain relief.

## 2023-09-24 NOTE — Progress Notes (Signed)
   Acute Office Visit  Subjective:     Patient ID: Stephen Buckley, male    DOB: [birth year], 73 y.o.   MRN: [id number]  Chief Complaint  Patient presents with   Care Management    Pt. States he's having ankle pain.    HPI Patient is in today for right ankle pain  Review of Systems  Constitutional: Negative.   Respiratory: Negative.    Cardiovascular: Negative.   Musculoskeletal:  Positive for joint pain (right ankle/foot, some mild swelling).  Neurological: Negative.         Objective:    BP 135/74 (BP Location: Right Arm, Patient Position: Sitting, Cuff Size: Large)   Pulse (!) 102   Ht 5\' 9"  (1.753 m)   Wt 216 lb (98 kg)   SpO2 90%   BMI 31.90 kg/m    Physical Exam Vitals and nursing note reviewed.  Constitutional:      Appearance: Normal appearance. He is obese.  Cardiovascular:     Rate and Rhythm: Normal rate and regular rhythm.  Pulmonary:     Effort: Pulmonary effort is normal.     Breath sounds: Normal breath sounds.  Musculoskeletal:     Right ankle: Swelling present. Tenderness present over the lateral malleolus. Decreased range of motion. Normal pulse.     Left ankle: Normal.  Neurological:     Mental Status: He is alert.     No results found for any visits on 09/24/23.      Assessment & Plan:   Problem List Items Addressed This Visit       Other   History of fracture of right ankle (Chronic)   Relevant Orders   Ambulatory referral to Orthopedic Surgery   Other Visit Diagnoses       Acute right ankle pain    -  Primary   will obtain x-ray of right ankle given distant hx of traumatic fx with surgical repair.  Refer to ortho for further evaluation and treatment.   Relevant Orders   DG Foot Complete Right   Ambulatory referral to Orthopedic Surgery       No orders of the defined types were placed in this encounter.   Return for as needed.  Darral Dash, FNP

## 2023-09-25 ENCOUNTER — Other Ambulatory Visit: Payer: Self-pay | Admitting: Internal Medicine

## 2023-09-29 ENCOUNTER — Other Ambulatory Visit (HOSPITAL_COMMUNITY): Payer: Self-pay | Admitting: Psychiatry

## 2023-09-29 DIAGNOSIS — F319 Bipolar disorder, unspecified: Secondary | ICD-10-CM

## 2023-09-29 DIAGNOSIS — S069X9S Unspecified intracranial injury with loss of consciousness of unspecified duration, sequela: Secondary | ICD-10-CM

## 2023-10-09 ENCOUNTER — Other Ambulatory Visit: Payer: Self-pay | Admitting: Internal Medicine

## 2023-10-09 DIAGNOSIS — E785 Hyperlipidemia, unspecified: Secondary | ICD-10-CM

## 2023-10-14 ENCOUNTER — Ambulatory Visit: Admitting: Internal Medicine

## 2023-10-14 ENCOUNTER — Encounter: Payer: Self-pay | Admitting: Internal Medicine

## 2023-10-14 VITALS — BP 128/82 | HR 98 | Ht 68.0 in | Wt 214.0 lb

## 2023-10-14 DIAGNOSIS — I10 Essential (primary) hypertension: Secondary | ICD-10-CM

## 2023-10-14 DIAGNOSIS — E669 Obesity, unspecified: Secondary | ICD-10-CM

## 2023-10-14 DIAGNOSIS — I6930 Unspecified sequelae of cerebral infarction: Secondary | ICD-10-CM

## 2023-10-14 DIAGNOSIS — G629 Polyneuropathy, unspecified: Secondary | ICD-10-CM | POA: Diagnosis not present

## 2023-10-14 DIAGNOSIS — K219 Gastro-esophageal reflux disease without esophagitis: Secondary | ICD-10-CM | POA: Diagnosis not present

## 2023-10-14 DIAGNOSIS — Z8781 Personal history of (healed) traumatic fracture: Secondary | ICD-10-CM

## 2023-10-14 DIAGNOSIS — R7303 Prediabetes: Secondary | ICD-10-CM | POA: Diagnosis not present

## 2023-10-14 DIAGNOSIS — E559 Vitamin D deficiency, unspecified: Secondary | ICD-10-CM | POA: Diagnosis not present

## 2023-10-14 DIAGNOSIS — S069X9S Unspecified intracranial injury with loss of consciousness of unspecified duration, sequela: Secondary | ICD-10-CM

## 2023-10-14 DIAGNOSIS — I639 Cerebral infarction, unspecified: Secondary | ICD-10-CM

## 2023-10-14 DIAGNOSIS — E785 Hyperlipidemia, unspecified: Secondary | ICD-10-CM | POA: Diagnosis not present

## 2023-10-14 DIAGNOSIS — R338 Other retention of urine: Secondary | ICD-10-CM

## 2023-10-14 DIAGNOSIS — F02818 Dementia in other diseases classified elsewhere, unspecified severity, with other behavioral disturbance: Secondary | ICD-10-CM

## 2023-10-14 DIAGNOSIS — N401 Enlarged prostate with lower urinary tract symptoms: Secondary | ICD-10-CM

## 2023-10-14 NOTE — Assessment & Plan Note (Signed)
 Symptoms remain well-controlled with pantoprazole  40 mg daily.

## 2023-10-14 NOTE — Assessment & Plan Note (Signed)
 Multiple recent acute visits in the setting of right foot/ankle pain.  He has been referred to orthopedic surgery and has an appointment scheduled 4/24.
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
## 2023-10-14 NOTE — Assessment & Plan Note (Signed)
 Seen by behavioral health for follow-up in November 2024.  He currently lives at [facility] assisted living facility.  He takes Seroquel  nightly, donepezil  and Namenda  daily.  Mood is stable.  No changes are indicated today.

## 2023-10-14 NOTE — Assessment & Plan Note (Signed)
 Remains adequately controlled on current antihypertensive regimen.  No medication changes are indicated today.

## 2023-10-14 NOTE — Assessment & Plan Note (Signed)
Symptoms remain well-controlled with Flomax.

## 2023-10-14 NOTE — Assessment & Plan Note (Signed)
 Lipid panel last updated in June 2024.  Total cholesterol 134 and LDL 54.  He is currently prescribed atorvastatin  80 mg daily.  Repeat lipid panel ordered today.

## 2023-10-14 NOTE — Assessment & Plan Note (Signed)
 He continues to endorse bilateral hand numbness but states that symptoms are stable with gabapentin  300 mg 3 times daily.

## 2023-10-14 NOTE — Patient Instructions (Signed)
 It was a pleasure to see you today.  Thank you for giving [place] the opportunity to be involved in your care.  Below is a brief recap of your visit and next steps.  We will plan to see you again in 6 months.  Summary No medication changes today Repeat labs ordered Follow up in 6 months

## 2023-10-15 ENCOUNTER — Other Ambulatory Visit: Payer: Self-pay | Admitting: Internal Medicine

## 2023-10-15 DIAGNOSIS — E559 Vitamin D deficiency, unspecified: Secondary | ICD-10-CM

## 2023-10-15 LAB — CBC WITH DIFFERENTIAL/PLATELET
Basophils Absolute: 0 10*3/uL (ref 0.0–0.2)
Basos: 0 %
EOS (ABSOLUTE): 0.1 10*3/uL (ref 0.0–0.4)
Eos: 2 %
Hematocrit: 40.3 % (ref 37.5–51.0)
Hemoglobin: 13.4 g/dL (ref 13.0–17.7)
Immature Grans (Abs): 0 10*3/uL (ref 0.0–0.1)
Immature Granulocytes: 0 %
Lymphocytes Absolute: 1.8 10*3/uL (ref 0.7–3.1)
Lymphs: 36 %
MCH: 29.6 pg (ref 26.6–33.0)
MCHC: 33.3 g/dL (ref 31.5–35.7)
MCV: 89 fL (ref 79–97)
Monocytes Absolute: 0.8 10*3/uL (ref 0.1–0.9)
Monocytes: 15 %
Neutrophils Absolute: 2.3 10*3/uL (ref 1.4–7.0)
Neutrophils: 47 %
Platelets: 196 10*3/uL (ref 150–450)
RBC: 4.53 x10E6/uL (ref 4.14–5.80)
RDW: 13.8 % (ref 11.6–15.4)
WBC: 4.9 10*3/uL (ref 3.4–10.8)

## 2023-10-15 LAB — CMP14+EGFR
ALT: 24 IU/L (ref 0–44)
AST: 29 IU/L (ref 0–40)
Albumin: 4.4 g/dL (ref 3.9–4.9)
Alkaline Phosphatase: 118 IU/L (ref 44–121)
BUN/Creatinine Ratio: 12 (ref 10–24)
BUN: 11 mg/dL (ref 8–27)
Bilirubin Total: 0.2 mg/dL (ref 0.0–1.2)
CO2: 20 mmol/L (ref 20–29)
Calcium: 9.2 mg/dL (ref 8.6–10.2)
Chloride: 109 mmol/L — ABNORMAL HIGH (ref 96–106)
Creatinine, Ser: 0.89 mg/dL (ref 0.76–1.27)
Globulin, Total: 2.9 g/dL (ref 1.5–4.5)
Glucose: 96 mg/dL (ref 70–99)
Potassium: 4.1 mmol/L (ref 3.5–5.2)
Sodium: 144 mmol/L (ref 134–144)
Total Protein: 7.3 g/dL (ref 6.0–8.5)
eGFR: 92 mL/min/{1.73_m2} (ref >59)

## 2023-10-15 LAB — TSH+FREE T4
Free T4: 0.68 ng/dL — ABNORMAL LOW (ref 0.82–1.77)
TSH: 1.62 u[IU]/mL (ref 0.450–4.500)

## 2023-10-15 LAB — VITAMIN D 25 HYDROXY (VIT D DEFICIENCY, FRACTURES): Vit D, 25-Hydroxy: 12.1 ng/mL — ABNORMAL LOW (ref 30.0–100.0)

## 2023-10-15 LAB — B12 AND FOLATE PANEL
Folate: 10.2 ng/mL (ref >3.0)
Vitamin B-12: 858 pg/mL (ref 232–1245)

## 2023-10-15 LAB — LIPID PANEL
Chol/HDL Ratio: 4.6 ratio (ref 0.0–5.0)
Cholesterol, Total: 138 mg/dL (ref 100–199)
HDL: 30 mg/dL — ABNORMAL LOW (ref >39)
LDL Chol Calc (NIH): 79 mg/dL (ref 0–99)
Triglycerides: 165 mg/dL — ABNORMAL HIGH (ref 0–149)
VLDL Cholesterol Cal: 29 mg/dL (ref 5–40)

## 2023-10-15 LAB — HEMOGLOBIN A1C
Est. average glucose Bld gHb Est-mCnc: 148 mg/dL
Hgb A1c MFr Bld: 6.8 % — ABNORMAL HIGH (ref 4.8–5.6)

## 2023-10-15 MED ORDER — VITAMIN D (ERGOCALCIFEROL) 1.25 MG (50000 UNIT) PO CAPS: 50000.0000 [IU] | ORAL_CAPSULE | ORAL | 0 refills | Status: DC

## 2023-10-16 ENCOUNTER — Telehealth: Payer: Self-pay

## 2023-10-16 DIAGNOSIS — Z79899 Other long term (current) drug therapy: Secondary | ICD-10-CM | POA: Diagnosis not present

## 2023-10-16 NOTE — Telephone Encounter (Signed)
 Copied from CRM [id number]. Topic: General - Other >> Oct 15, 2023  1:56 PM Shardie S wrote: Reason for CRM: Patient requesting a callback from provider/nurse. He states he has some additional questions from his 4/21 appointment regarding " Statistics taken from visit".

## 2023-10-16 NOTE — Telephone Encounter (Signed)
 Tried calling pt unable to reach him.

## 2023-10-17 ENCOUNTER — Ambulatory Visit: Admitting: Orthopedic Surgery

## 2023-10-17 ENCOUNTER — Encounter: Payer: Self-pay | Admitting: Orthopedic Surgery

## 2023-10-17 ENCOUNTER — Other Ambulatory Visit (INDEPENDENT_AMBULATORY_CARE_PROVIDER_SITE_OTHER): Payer: Self-pay

## 2023-10-17 VITALS — BP 121/82 | Ht 68.0 in | Wt 214.0 lb

## 2023-10-17 DIAGNOSIS — M25571 Pain in right ankle and joints of right foot: Secondary | ICD-10-CM

## 2023-10-17 DIAGNOSIS — M76821 Posterior tibial tendinitis, right leg: Secondary | ICD-10-CM

## 2023-10-17 MED ORDER — MELOXICAM 7.5 MG PO TABS: 7.5000 mg | ORAL_TABLET | Freq: Every day | ORAL | 5 refills | Status: DC
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
## 2023-11-01 DIAGNOSIS — H02105 Unspecified ectropion of left lower eyelid: Secondary | ICD-10-CM | POA: Diagnosis not present

## 2023-11-19 ENCOUNTER — Ambulatory Visit: Payer: Self-pay | Admitting: Internal Medicine

## 2023-11-20 DIAGNOSIS — B351 Tinea unguium: Secondary | ICD-10-CM | POA: Diagnosis not present

## 2023-11-20 DIAGNOSIS — M79675 Pain in left toe(s): Secondary | ICD-10-CM | POA: Diagnosis not present

## 2023-11-20 DIAGNOSIS — I639 Cerebral infarction, unspecified: Secondary | ICD-10-CM | POA: Diagnosis not present

## 2023-11-20 DIAGNOSIS — L851 Acquired keratosis [keratoderma] palmaris et plantaris: Secondary | ICD-10-CM | POA: Diagnosis not present

## 2023-11-20 DIAGNOSIS — M79674 Pain in right toe(s): Secondary | ICD-10-CM | POA: Diagnosis not present

## 2023-11-20 DIAGNOSIS — Z79899 Other long term (current) drug therapy: Secondary | ICD-10-CM | POA: Diagnosis not present

## 2023-11-24 ENCOUNTER — Other Ambulatory Visit: Payer: Self-pay | Admitting: Internal Medicine

## 2023-11-24 DIAGNOSIS — I10 Essential (primary) hypertension: Secondary | ICD-10-CM

## 2023-11-27 ENCOUNTER — Other Ambulatory Visit: Payer: Self-pay | Admitting: Internal Medicine

## 2023-11-27 DIAGNOSIS — J302 Other seasonal allergic rhinitis: Secondary | ICD-10-CM

## 2023-12-03 ENCOUNTER — Telehealth: Payer: Self-pay | Admitting: Internal Medicine

## 2023-12-03 DIAGNOSIS — E559 Vitamin D deficiency, unspecified: Secondary | ICD-10-CM

## 2023-12-03 MED ORDER — VITAMIN D (ERGOCALCIFEROL) 1.25 MG (50000 UNIT) PO CAPS: 50000.0000 [IU] | ORAL_CAPSULE | ORAL | 0 refills | Status: DC

## 2023-12-03 MED ORDER — UMECLIDINIUM-VILANTEROL 62.5-25 MCG/ACT IN AEPB: 1.0000 | INHALATION_SPRAY | Freq: Every day | RESPIRATORY_TRACT | 0 refills | Status: DC

## 2023-12-03 NOTE — Telephone Encounter (Signed)
 Copied from CRM [id number]. Topic: Clinical - Medication Refill >> Dec 03, 2023 12:03 PM Rachelle R wrote: Medication:  Vitamin D , Ergocalciferol , (DRISDOL ) 1.25 MG (50000 UNIT) CAPS capsule ANORO ELLIPTA  62.5-25 MCG/ACT AEPB (Requesting a monthly supply because they fill as a med pack, need prescription with 5)  Has the patient contacted their pharmacy? Yes, call dr  This is the patient's preferred pharmacy:  [facility] [place], [place] - [address] [place] [place] [address] Phone: [phone] Fax: [phone]  Is this the correct pharmacy for this prescription? Yes If no, delete pharmacy and type the correct one.   Has the prescription been filled recently? No  Is the patient out of the medication? Yes  Has the patient been seen for an appointment in the last year OR does the patient have an upcoming appointment? Yes  Can we respond through MyChart? Yes  Agent: Please be advised that Rx refills may take up to 3 business days. We ask that you follow-up with your pharmacy.

## 2023-12-11 ENCOUNTER — Telehealth: Payer: Self-pay | Admitting: Neurology

## 2023-12-11 NOTE — Telephone Encounter (Signed)
 Appointment details confirmed

## 2023-12-18 ENCOUNTER — Ambulatory Visit (INDEPENDENT_AMBULATORY_CARE_PROVIDER_SITE_OTHER): Payer: 59 | Admitting: Neurology

## 2023-12-18 ENCOUNTER — Encounter: Payer: Self-pay | Admitting: Neurology

## 2023-12-18 VITALS — BP 132/79 | HR 88 | Resp 15 | Ht 69.0 in

## 2023-12-18 DIAGNOSIS — G3184 Mild cognitive impairment, so stated: Secondary | ICD-10-CM

## 2023-12-18 DIAGNOSIS — R569 Unspecified convulsions: Secondary | ICD-10-CM

## 2023-12-18 DIAGNOSIS — Z79899 Other long term (current) drug therapy: Secondary | ICD-10-CM | POA: Diagnosis not present
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
## 2023-12-18 NOTE — Patient Instructions (Signed)
 Continue with current medications including Tegretol  200 mg in the morning and 300 mg at night, Aricept  5 mg nightly, Namenda  10 mg twice daily Continue follow-up PCP Return in 1 year or sooner if worse Please contact us  if you have any additional questions or concerns.

## 2023-12-19 DIAGNOSIS — Z79899 Other long term (current) drug therapy: Secondary | ICD-10-CM | POA: Diagnosis not present

## 2023-12-25 ENCOUNTER — Other Ambulatory Visit: Payer: Self-pay | Admitting: Internal Medicine

## 2023-12-29 ENCOUNTER — Other Ambulatory Visit: Payer: Self-pay | Admitting: Internal Medicine

## 2023-12-29 DIAGNOSIS — G629 Polyneuropathy, unspecified: Secondary | ICD-10-CM

## 2023-12-29 DIAGNOSIS — E785 Hyperlipidemia, unspecified: Secondary | ICD-10-CM

## 2023-12-31 ENCOUNTER — Telehealth: Payer: Self-pay

## 2023-12-31 NOTE — Telephone Encounter (Signed)
 Copied from CRM [id number]. Topic: General - Other >> Dec 31, 2023 12:44 PM Avram G wrote: Reason for CRM: Luke is calling because she faxed over a diet & standing order and it was uploaded to the chart on 12/11/23. She is waiting for the form to be signed and faxed over by the provider.

## 2023-12-31 NOTE — Telephone Encounter (Signed)
 Printed and re-faxed

## 2024-01-08 ENCOUNTER — Ambulatory Visit: Payer: 59

## 2024-01-08 VITALS — Ht 69.0 in | Wt 220.0 lb

## 2024-01-08 DIAGNOSIS — Z0000 Encounter for general adult medical examination without abnormal findings: Secondary | ICD-10-CM

## 2024-01-08 DIAGNOSIS — F17210 Nicotine dependence, cigarettes, uncomplicated: Secondary | ICD-10-CM | POA: Diagnosis not present

## 2024-01-08 DIAGNOSIS — Z1211 Encounter for screening for malignant neoplasm of colon: Secondary | ICD-10-CM | POA: Diagnosis not present

## 2024-01-08 DIAGNOSIS — Z0001 Encounter for general adult medical examination with abnormal findings: Secondary | ICD-10-CM

## 2024-01-08 DIAGNOSIS — F17200 Nicotine dependence, unspecified, uncomplicated: Secondary | ICD-10-CM | POA: Diagnosis not present

## 2024-01-08 NOTE — Patient Instructions (Addendum)
 Mr. Weinberg ,  Thank you for taking time out of your busy schedule to complete your Annual Wellness Visit with me. I enjoyed our conversation and look forward to speaking with you again next year. I, as well as your care team,  appreciate your ongoing commitment to your health goals. Please review the following plan we discussed and let me know if I can assist you in the future.  I enjoyed our conversation and look forward to it again next year. Blessing for the upcoming year!!  -Abhi Moccia  Your Game plan/ To Do List   Referrals Placed:  Colon Cancer Screening: [facility] [address] [place] [address] [place],  [place]  [address] Main: [phone]   You have been referred to have a low dose lung cancer screening test. If you haven't heard from them in a week please call one of the numbers listed below.  [facility] Phone: [phone]   Follow up Visits:  Next Office Visit with your Primary Care Provider: April 14, 2024 at 2:20 pm in office  Medicare Wellness with Health Advisor (1 year): January 11, 2025 at 11:20 am telephone visit.     Clinician Recommendations:  Aim for 30 minutes of exercise or brisk walking, 6-8 glasses of water, and 5 servings of fruits and vegetables each day.       This is a list of the screening recommended for you and due dates:  Health Maintenance  Topic Date Due   Zoster (Shingles) Vaccine (2 of 2) 06/12/2021   DTaP/Tdap/Td vaccine (2 - Td or Tdap) 07/10/2021   COVID-19 Vaccine (6 - 2024-25 season) 02/24/2023   Screening for Lung Cancer  07/07/2023   Colon Cancer Screening  03/23/2024   Flu Shot  01/24/2024   Medicare Annual Wellness Visit  01/07/2025   Pneumococcal Vaccine for age over 67  Completed   Hepatitis C Screening  Completed   Hepatitis B Vaccine  Aged Out   HPV Vaccine  Aged Out   Meningitis B Vaccine  Aged Out    Advanced directives: (In Chart) A copy of your advanced directives  are scanned into your chart should your provider ever need it. Advance Care Planning is important because it:  [x]  Makes sure you receive the medical care that is consistent with your values, goals, and preferences  [x]  It provides guidance to your family and loved ones and reduces their decisional burden about whether or not they are making the right decisions based on your wishes.  Follow the link provided in your after visit summary or read over the paperwork we have mailed to you to help you started getting your Advance Directives in place. If you need assistance in completing these, please reach out to us  so that we can help you!  If you choose to send your directives in yourself, the information to do so is below:  Please email a copy of your Advanced Healthcare Directives,such as your Healthcare Power of Attorney, Living Will or DNR status to the following secure email: [email or web address]
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
## 2024-01-15 DIAGNOSIS — Z79899 Other long term (current) drug therapy: Secondary | ICD-10-CM | POA: Diagnosis not present

## 2024-01-21 DIAGNOSIS — B351 Tinea unguium: Secondary | ICD-10-CM | POA: Diagnosis not present

## 2024-01-21 DIAGNOSIS — M79674 Pain in right toe(s): Secondary | ICD-10-CM | POA: Diagnosis not present

## 2024-01-21 DIAGNOSIS — L603 Nail dystrophy: Secondary | ICD-10-CM | POA: Diagnosis not present

## 2024-01-21 DIAGNOSIS — L84 Corns and callosities: Secondary | ICD-10-CM | POA: Diagnosis not present

## 2024-01-21 DIAGNOSIS — R2689 Other abnormalities of gait and mobility: Secondary | ICD-10-CM | POA: Diagnosis not present

## 2024-01-21 DIAGNOSIS — M79675 Pain in left toe(s): Secondary | ICD-10-CM | POA: Diagnosis not present

## 2024-01-22 ENCOUNTER — Other Ambulatory Visit: Payer: Self-pay

## 2024-01-22 ENCOUNTER — Encounter: Payer: Self-pay | Admitting: Emergency Medicine

## 2024-01-27 ENCOUNTER — Other Ambulatory Visit: Payer: Self-pay

## 2024-01-27 DIAGNOSIS — E785 Hyperlipidemia, unspecified: Secondary | ICD-10-CM

## 2024-01-27 DIAGNOSIS — G629 Polyneuropathy, unspecified: Secondary | ICD-10-CM

## 2024-01-28 ENCOUNTER — Other Ambulatory Visit: Payer: Self-pay | Admitting: Internal Medicine

## 2024-02-02 ENCOUNTER — Other Ambulatory Visit: Payer: Self-pay | Admitting: Internal Medicine

## 2024-02-11 ENCOUNTER — Telehealth: Admitting: Nurse Practitioner

## 2024-02-11 ENCOUNTER — Other Ambulatory Visit: Payer: Self-pay | Admitting: Nurse Practitioner

## 2024-02-11 ENCOUNTER — Telehealth: Payer: Self-pay

## 2024-02-11 DIAGNOSIS — U071 COVID-19: Secondary | ICD-10-CM

## 2024-02-11 MED ORDER — PAXLOVID (150/100) 10 X 150 MG & 10 X 100MG PO TBPK: 2.0000 | ORAL_TABLET | Freq: Two times a day (BID) | ORAL | 0 refills | Status: DC

## 2024-02-11 MED ORDER — NIRMATRELVIR/RITONAVIR (PAXLOVID)TABLET
3.0000 | ORAL_TABLET | Freq: Two times a day (BID) | ORAL | 0 refills | Status: DC
Start: 2024-02-11 — End: 2024-02-15

## 2024-02-11 MED ORDER — DM-GUAIFENESIN ER 30-600 MG PO TB12: 1.0000 | ORAL_TABLET | Freq: Two times a day (BID) | ORAL | 0 refills | Status: DC

## 2024-02-11 NOTE — Patient Instructions (Signed)
 1) Covid positive - push water, Mucinex  DM and Paxlovid  - pt in nursing facility and staff aware

## 2024-02-11 NOTE — Addendum Note (Signed)
 Addended by: GLENNON SAND on: 02/11/2024 04:10 PM   Modules accepted: Orders

## 2024-02-11 NOTE — Telephone Encounter (Signed)
 Copied from CRM [id number]. Topic: Clinical - Medical Advice >> Feb 11, 2024 10:04 AM Treva T wrote: Reason for CRM: Received call from Luke, with [facility], calling on behalf of patient.  Reports patient was tested for COVID on yesterday, 02/10/24, and test results were positive.  Inquiring if medication can be called in for patient. States current symptoms are runny nose, and per patient states feels bad.  Kim with assisted living can be reached back at [phone].  Preferred Local Pharmacy:  [id number] - [place], [place] - [address] AT [facility] [address] [place] [address] Phone: [phone] Fax: [phone]
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
## 2024-02-13 ENCOUNTER — Other Ambulatory Visit: Payer: Self-pay

## 2024-02-13 ENCOUNTER — Emergency Department (HOSPITAL_COMMUNITY)

## 2024-02-13 ENCOUNTER — Encounter (HOSPITAL_COMMUNITY): Payer: Self-pay

## 2024-02-13 ENCOUNTER — Inpatient Hospital Stay (HOSPITAL_COMMUNITY)
Admission: EM | Admit: 2024-02-13 | Discharge: 2024-02-15 | DRG: 871 | Disposition: A | Discharge status: Nursing Home (Includes ICF) | Source: Skilled Nursing Facility | Attending: Family Medicine | Admitting: Family Medicine

## 2024-02-13 DIAGNOSIS — U071 COVID-19: Principal | ICD-10-CM | POA: Diagnosis present

## 2024-02-13 DIAGNOSIS — J410 Simple chronic bronchitis: Secondary | ICD-10-CM

## 2024-02-13 DIAGNOSIS — Z8709 Personal history of other diseases of the respiratory system: Secondary | ICD-10-CM

## 2024-02-13 DIAGNOSIS — J1282 Pneumonia due to coronavirus disease 2019: Secondary | ICD-10-CM | POA: Diagnosis present

## 2024-02-13 DIAGNOSIS — F17210 Nicotine dependence, cigarettes, uncomplicated: Secondary | ICD-10-CM | POA: Diagnosis present

## 2024-02-13 DIAGNOSIS — F0393 Unspecified dementia, unspecified severity, with mood disturbance: Secondary | ICD-10-CM | POA: Diagnosis present

## 2024-02-13 DIAGNOSIS — N400 Enlarged prostate without lower urinary tract symptoms: Secondary | ICD-10-CM | POA: Diagnosis present

## 2024-02-13 DIAGNOSIS — A419 Sepsis, unspecified organism: Secondary | ICD-10-CM | POA: Diagnosis not present

## 2024-02-13 DIAGNOSIS — J440 Chronic obstructive pulmonary disease with acute lower respiratory infection: Secondary | ICD-10-CM | POA: Diagnosis not present

## 2024-02-13 DIAGNOSIS — J9601 Acute respiratory failure with hypoxia: Secondary | ICD-10-CM | POA: Diagnosis not present

## 2024-02-13 DIAGNOSIS — Z7982 Long term (current) use of aspirin: Secondary | ICD-10-CM

## 2024-02-13 DIAGNOSIS — Z8619 Personal history of other infectious and parasitic diseases: Secondary | ICD-10-CM

## 2024-02-13 DIAGNOSIS — M47812 Spondylosis without myelopathy or radiculopathy, cervical region: Secondary | ICD-10-CM | POA: Diagnosis not present

## 2024-02-13 DIAGNOSIS — R221 Localized swelling, mass and lump, neck: Secondary | ICD-10-CM | POA: Diagnosis not present

## 2024-02-13 DIAGNOSIS — H409 Unspecified glaucoma: Secondary | ICD-10-CM | POA: Diagnosis present

## 2024-02-13 DIAGNOSIS — J441 Chronic obstructive pulmonary disease with (acute) exacerbation: Secondary | ICD-10-CM | POA: Diagnosis present

## 2024-02-13 DIAGNOSIS — D649 Anemia, unspecified: Secondary | ICD-10-CM | POA: Diagnosis not present

## 2024-02-13 DIAGNOSIS — Z860101 Personal history of adenomatous and serrated colon polyps: Secondary | ICD-10-CM

## 2024-02-13 DIAGNOSIS — Z79899 Other long term (current) drug therapy: Secondary | ICD-10-CM

## 2024-02-13 DIAGNOSIS — R590 Localized enlarged lymph nodes: Secondary | ICD-10-CM | POA: Diagnosis not present

## 2024-02-13 DIAGNOSIS — Z791 Long term (current) use of non-steroidal anti-inflammatories (NSAID): Secondary | ICD-10-CM | POA: Diagnosis not present

## 2024-02-13 DIAGNOSIS — N401 Enlarged prostate with lower urinary tract symptoms: Secondary | ICD-10-CM | POA: Diagnosis not present

## 2024-02-13 DIAGNOSIS — A4189 Other specified sepsis: Secondary | ICD-10-CM | POA: Diagnosis present

## 2024-02-13 DIAGNOSIS — E785 Hyperlipidemia, unspecified: Secondary | ICD-10-CM

## 2024-02-13 DIAGNOSIS — K219 Gastro-esophageal reflux disease without esophagitis: Secondary | ICD-10-CM | POA: Diagnosis not present

## 2024-02-13 DIAGNOSIS — F39 Unspecified mood [affective] disorder: Secondary | ICD-10-CM | POA: Diagnosis present

## 2024-02-13 DIAGNOSIS — F319 Bipolar disorder, unspecified: Secondary | ICD-10-CM | POA: Diagnosis present

## 2024-02-13 DIAGNOSIS — F419 Anxiety disorder, unspecified: Secondary | ICD-10-CM | POA: Diagnosis present

## 2024-02-13 DIAGNOSIS — Z833 Family history of diabetes mellitus: Secondary | ICD-10-CM | POA: Diagnosis not present

## 2024-02-13 DIAGNOSIS — E559 Vitamin D deficiency, unspecified: Secondary | ICD-10-CM

## 2024-02-13 DIAGNOSIS — Z8673 Personal history of transient ischemic attack (TIA), and cerebral infarction without residual deficits: Secondary | ICD-10-CM

## 2024-02-13 DIAGNOSIS — G40909 Epilepsy, unspecified, not intractable, without status epilepticus: Secondary | ICD-10-CM | POA: Diagnosis present

## 2024-02-13 DIAGNOSIS — Z87820 Personal history of traumatic brain injury: Secondary | ICD-10-CM

## 2024-02-13 DIAGNOSIS — I10 Essential (primary) hypertension: Secondary | ICD-10-CM | POA: Diagnosis not present

## 2024-02-13 DIAGNOSIS — S069X9S Unspecified intracranial injury with loss of consciousness of unspecified duration, sequela: Secondary | ICD-10-CM

## 2024-02-13 DIAGNOSIS — R0602 Shortness of breath: Secondary | ICD-10-CM | POA: Diagnosis present

## 2024-02-13 DIAGNOSIS — R338 Other retention of urine: Secondary | ICD-10-CM | POA: Diagnosis not present

## 2024-02-13 DIAGNOSIS — I639 Cerebral infarction, unspecified: Secondary | ICD-10-CM | POA: Diagnosis present

## 2024-02-13 DIAGNOSIS — R000 Tachycardia, unspecified: Secondary | ICD-10-CM | POA: Diagnosis not present

## 2024-02-13 LAB — CBC WITH DIFFERENTIAL/PLATELET
Abs Immature Granulocytes: 0.07 K/uL (ref 0.00–0.07)
Basophils Absolute: 0 K/uL (ref 0.0–0.1)
Basophils Relative: 0 %
Eosinophils Absolute: 0 K/uL (ref 0.0–0.5)
Eosinophils Relative: 0 %
HCT: 35 % — ABNORMAL LOW (ref 39.0–52.0)
Hemoglobin: 11.2 g/dL — ABNORMAL LOW (ref 13.0–17.0)
Immature Granulocytes: 1 %
Lymphocytes Relative: 11 %
Lymphs Abs: 1 K/uL (ref 0.7–4.0)
MCH: 29.2 pg (ref 26.0–34.0)
MCHC: 32 g/dL (ref 30.0–36.0)
MCV: 91.4 fL (ref 80.0–100.0)
Monocytes Absolute: 1.4 K/uL — ABNORMAL HIGH (ref 0.1–1.0)
Monocytes Relative: 14 %
Neutro Abs: 6.9 K/uL (ref 1.7–7.7)
Neutrophils Relative %: 74 %
Platelets: 128 K/uL — ABNORMAL LOW (ref 150–400)
RBC: 3.83 MIL/uL — ABNORMAL LOW (ref 4.22–5.81)
RDW: 15.7 % — ABNORMAL HIGH (ref 11.5–15.5)
WBC: 9.4 K/uL (ref 4.0–10.5)
nRBC: 0 % (ref 0.0–0.2)

## 2024-02-13 LAB — COMPREHENSIVE METABOLIC PANEL WITH GFR
ALT: 23 U/L (ref 0–44)
AST: 31 U/L (ref 15–41)
Albumin: 3.7 g/dL (ref 3.5–5.0)
Alkaline Phosphatase: 63 U/L (ref 38–126)
Anion gap: 12 (ref 5–15)
BUN: 9 mg/dL (ref 8–23)
CO2: 21 mmol/L — ABNORMAL LOW (ref 22–32)
Calcium: 8 mg/dL — ABNORMAL LOW (ref 8.9–10.3)
Chloride: 108 mmol/L (ref 98–111)
Creatinine, Ser: 1.01 mg/dL (ref 0.61–1.24)
GFR, Estimated: >60 mL/min (ref >60)
Glucose, Bld: 112 mg/dL — ABNORMAL HIGH (ref 70–99)
Potassium: 3.8 mmol/L (ref 3.5–5.1)
Sodium: 141 mmol/L (ref 135–145)
Total Bilirubin: 0.6 mg/dL (ref 0.0–1.2)
Total Protein: 6.8 g/dL (ref 6.5–8.1)

## 2024-02-13 LAB — LACTIC ACID, PLASMA
Lactic Acid, Venous: 1.1 mmol/L (ref 0.5–1.9)
Lactic Acid, Venous: 1.3 mmol/L (ref 0.5–1.9)

## 2024-02-13 LAB — URINALYSIS, W/ REFLEX TO CULTURE (INFECTION SUSPECTED)
Bacteria, UA: NONE SEEN
Bilirubin Urine: NEGATIVE
Glucose, UA: NEGATIVE mg/dL
Hgb urine dipstick: NEGATIVE
Ketones, ur: NEGATIVE mg/dL
Leukocytes,Ua: NEGATIVE
Nitrite: NEGATIVE
Protein, ur: NEGATIVE mg/dL
Specific Gravity, Urine: 1.014 (ref 1.005–1.030)
pH: 5 (ref 5.0–8.0)

## 2024-02-13 LAB — TROPONIN I (HIGH SENSITIVITY)
Troponin I (High Sensitivity): 20 ng/L — ABNORMAL HIGH (ref <18)
Troponin I (High Sensitivity): 22 ng/L — ABNORMAL HIGH (ref <18)

## 2024-02-13 LAB — PROTIME-INR
INR: 1.1 (ref 0.8–1.2)
Prothrombin Time: 14.5 s (ref 11.4–15.2)

## 2024-02-13 LAB — D-DIMER, QUANTITATIVE: D-Dimer, Quant: 0.49 ug{FEU}/mL (ref 0.00–0.50)

## 2024-02-13 LAB — RESP PANEL BY RT-PCR (RSV, FLU A&B, COVID)  RVPGX2
Influenza A by PCR: NEGATIVE
Influenza B by PCR: NEGATIVE
Resp Syncytial Virus by PCR: NEGATIVE
SARS Coronavirus 2 by RT PCR: POSITIVE — AB

## 2024-02-13 LAB — FERRITIN: Ferritin: 35 ng/mL (ref 24–336)

## 2024-02-13 LAB — LACTATE DEHYDROGENASE: LDH: 203 U/L — ABNORMAL HIGH (ref 98–192)

## 2024-02-13 MED ORDER — ONDANSETRON HCL 4 MG PO TABS: 4.0000 mg | ORAL_TABLET | Freq: Four times a day (QID) | ORAL | Status: DC | PRN

## 2024-02-13 MED ORDER — ONDANSETRON HCL 4 MG/2ML IJ SOLN: 4.0000 mg | Freq: Four times a day (QID) | INTRAMUSCULAR | Status: DC | PRN

## 2024-02-13 MED ORDER — POLYETHYLENE GLYCOL 3350 17 G PO PACK: 17.0000 g | PACK | Freq: Every day | ORAL | Status: DC | PRN

## 2024-02-13 MED ORDER — ACETAMINOPHEN 650 MG RE SUPP: 650.0000 mg | Freq: Four times a day (QID) | RECTAL | Status: DC | PRN

## 2024-02-13 MED ORDER — ENOXAPARIN SODIUM 40 MG/0.4ML IJ SOSY: 40.0000 mg | PREFILLED_SYRINGE | INTRAMUSCULAR | Status: DC

## 2024-02-13 MED ORDER — ALBUTEROL SULFATE HFA 108 (90 BASE) MCG/ACT IN AERS: 1.0000 | INHALATION_SPRAY | RESPIRATORY_TRACT | Status: DC | PRN

## 2024-02-13 MED ORDER — LACTATED RINGERS IV BOLUS (SEPSIS)
1000.0000 mL | Freq: Once | INTRAVENOUS | Status: AC
  Administered 2024-02-13: 1000 mL via INTRAVENOUS

## 2024-02-13 MED ORDER — LACTATED RINGERS IV SOLN: INTRAVENOUS | Status: AC

## 2024-02-13 MED ORDER — SODIUM CHLORIDE 0.9 % IV SOLN
2.0000 g | INTRAVENOUS | Status: DC
  Administered 2024-02-14: 2 g via INTRAVENOUS
  Filled 2024-02-13: qty 20

## 2024-02-13 MED ORDER — METHYLPREDNISOLONE SODIUM SUCC 125 MG IJ SOLR
0.5000 mg/kg | Freq: Two times a day (BID) | INTRAMUSCULAR | Status: DC
  Administered 2024-02-13 – 2024-02-14 (×2): 50.625 mg via INTRAVENOUS
  Filled 2024-02-13 (×2): qty 2

## 2024-02-13 MED ORDER — LACTATED RINGERS IV SOLN: INTRAVENOUS | Status: DC

## 2024-02-13 MED ORDER — GUAIFENESIN-DM 100-10 MG/5ML PO SYRP
15.0000 mL | ORAL_SOLUTION | Freq: Three times a day (TID) | ORAL | Status: AC
  Administered 2024-02-13 – 2024-02-14 (×3): 15 mL via ORAL
  Filled 2024-02-13 (×3): qty 15

## 2024-02-13 MED ORDER — ACETAMINOPHEN 500 MG PO TABS
1000.0000 mg | ORAL_TABLET | Freq: Once | ORAL | Status: AC
  Administered 2024-02-13: 1000 mg via ORAL
  Filled 2024-02-13: qty 2

## 2024-02-13 MED ORDER — ENOXAPARIN SODIUM 60 MG/0.6ML IJ SOSY
50.0000 mg | PREFILLED_SYRINGE | INTRAMUSCULAR | Status: DC
  Administered 2024-02-13 – 2024-02-14 (×2): 50 mg via SUBCUTANEOUS
  Filled 2024-02-13 (×2): qty 0.6

## 2024-02-13 MED ORDER — SODIUM CHLORIDE 0.9 % IV SOLN
500.0000 mg | INTRAVENOUS | Status: DC
  Administered 2024-02-14: 500 mg via INTRAVENOUS
  Filled 2024-02-13 (×2): qty 5

## 2024-02-13 MED ORDER — IOHEXOL 300 MG/ML  SOLN
80.0000 mL | Freq: Once | INTRAMUSCULAR | Status: AC | PRN
  Administered 2024-02-13: 80 mL via INTRAVENOUS

## 2024-02-13 MED ORDER — PREDNISONE 20 MG PO TABS: 50.0000 mg | ORAL_TABLET | Freq: Every day | ORAL | Status: DC

## 2024-02-13 MED ORDER — SODIUM CHLORIDE 0.9 % IV SOLN
500.0000 mg | Freq: Once | INTRAVENOUS | Status: AC
  Administered 2024-02-13: 500 mg via INTRAVENOUS
  Filled 2024-02-13: qty 5

## 2024-02-13 MED ORDER — SODIUM CHLORIDE 0.9 % IV SOLN
2.0000 g | Freq: Once | INTRAVENOUS | Status: AC
  Administered 2024-02-13: 2 g via INTRAVENOUS
  Filled 2024-02-13: qty 20

## 2024-02-13 MED ORDER — ACETAMINOPHEN 325 MG PO TABS: 650.0000 mg | ORAL_TABLET | Freq: Four times a day (QID) | ORAL | Status: DC | PRN

## 2024-02-13 NOTE — ED Notes (Signed)
 Pt tested postive for Covid-19 on 02/10/24
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
## 2024-02-13 NOTE — Assessment & Plan Note (Addendum)
 On carbamazepine  for bipolar disorder, also on gabapentin .

## 2024-02-13 NOTE — ED Triage Notes (Signed)
 Pt comes by EMS for COVID s/s. Pt is from [facility]. COVID + for the past week. A&Ox4   EMS VS CBG 117 HR 110 O2 97% RA BP 116/72 RR 20

## 2024-02-13 NOTE — Sepsis Progress Note (Signed)
 Elink monitoring for the code sepsis protocol.

## 2024-02-13 NOTE — Assessment & Plan Note (Signed)
Stable. -Resume Norvasc, tamsulosin

## 2024-02-13 NOTE — Progress Notes (Signed)
 PHARMACIST - PHYSICIAN COMMUNICATION  CONCERNING:  Enoxaparin  (Lovenox ) for DVT Prophylaxis    RECOMMENDATION: Patient was prescribed enoxaprin 40mg  q24 hours for VTE prophylaxis.   Filed Weights   02/13/24 1950  Weight: 100.8 kg (222 lb 3.6 oz)    Body mass index is 33.79 kg/m.  Estimated Creatinine Clearance: 78.4 mL/min (by C-G formula based on SCr of 1.01 mg/dL).   Based on [facility] policy patient is candidate for enoxaparin  0.5mg /kg TBW SQ every 24 hours based on BMI being >30.  DESCRIPTION: Pharmacy has adjusted enoxaparin  dose per [facility] policy.  Patient is now receiving enoxaparin  0.5 mg/kg every 24 hours    Adriana JONETTA Bolster, PharmD Clinical Pharmacist  02/13/2024 8:06 PM
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
## 2024-02-13 NOTE — Assessment & Plan Note (Signed)
 Resume finasteride , tamsulosin .

## 2024-02-13 NOTE — Assessment & Plan Note (Addendum)
 On several psychoactive agents.   - Resume, donepezil , memantine , Seroquel , varenicline , carbamazepine 

## 2024-02-13 NOTE — Assessment & Plan Note (Addendum)
 Diagnosed 8/18 with COVID.  Worsening cough, dyspnea, with new hypoxia.  Started on Paxlovid  8/19. CT chest WC-right lower lobe pneumonia. ?bacterial superimposed infection.  Showing meeting sepsis criteria with with fever of 101, tachycardia heart rate 85-109, and tachypnea respiratory rate 16-27.  Lactic acid 1.1. -IV ceftriaxone  and IV azithromycin  -Outside window for benefit with remdesivir -IV Solu-Medrol  weight-based x 24hrs then prednisone  - Check procalcitonin - 1 L LR bolus given, continue LR 75 cc/h x 12hr - Obtain and trend inflammatory panels-CRP, D-dimer, LDH, ferritin - Paxlovid  ordered to complete course if available formulary.

## 2024-02-13 NOTE — Assessment & Plan Note (Signed)
 Stable.  Nursing home resident. -Resume aspirin , Lipitor

## 2024-02-13 NOTE — Sepsis Progress Note (Signed)
 One set blood cultures drawn before antibiotics hung

## 2024-02-13 NOTE — ED Notes (Addendum)
 Report given to [facility] LPN of [id number] at this time. No questions, comments, or concerns after report was given.

## 2024-02-14 DIAGNOSIS — U071 COVID-19: Secondary | ICD-10-CM | POA: Diagnosis not present

## 2024-02-14 DIAGNOSIS — J1282 Pneumonia due to coronavirus disease 2019: Secondary | ICD-10-CM | POA: Diagnosis not present

## 2024-02-14 LAB — BASIC METABOLIC PANEL WITH GFR
Anion gap: 8 (ref 5–15)
BUN: 10 mg/dL (ref 8–23)
CO2: 22 mmol/L (ref 22–32)
Calcium: 8.6 mg/dL — ABNORMAL LOW (ref 8.9–10.3)
Chloride: 113 mmol/L — ABNORMAL HIGH (ref 98–111)
Creatinine, Ser: 0.73 mg/dL (ref 0.61–1.24)
GFR, Estimated: >60 mL/min (ref >60)
Glucose, Bld: 180 mg/dL — ABNORMAL HIGH (ref 70–99)
Potassium: 3.9 mmol/L (ref 3.5–5.1)
Sodium: 143 mmol/L (ref 135–145)

## 2024-02-14 LAB — CBC
HCT: 36.7 % — ABNORMAL LOW (ref 39.0–52.0)
Hemoglobin: 11.9 g/dL — ABNORMAL LOW (ref 13.0–17.0)
MCH: 29.1 pg (ref 26.0–34.0)
MCHC: 32.4 g/dL (ref 30.0–36.0)
MCV: 89.7 fL (ref 80.0–100.0)
Platelets: 146 K/uL — ABNORMAL LOW (ref 150–400)
RBC: 4.09 MIL/uL — ABNORMAL LOW (ref 4.22–5.81)
RDW: 15.6 % — ABNORMAL HIGH (ref 11.5–15.5)
WBC: 10.3 K/uL (ref 4.0–10.5)
nRBC: 0 % (ref 0.0–0.2)

## 2024-02-14 LAB — C-REACTIVE PROTEIN: CRP: 10.8 mg/dL — ABNORMAL HIGH (ref <1.0)

## 2024-02-14 LAB — HIV ANTIBODY (ROUTINE TESTING W REFLEX): HIV Screen 4th Generation wRfx: NONREACTIVE

## 2024-02-14 LAB — LACTATE DEHYDROGENASE: LDH: 230 U/L — ABNORMAL HIGH (ref 98–192)

## 2024-02-14 LAB — PROCALCITONIN: Procalcitonin: 1.81 ng/mL

## 2024-02-14 LAB — FERRITIN: Ferritin: 33 ng/mL (ref 24–336)

## 2024-02-14 LAB — D-DIMER, QUANTITATIVE: D-Dimer, Quant: 0.45 ug{FEU}/mL (ref 0.00–0.50)

## 2024-02-14 MED ORDER — OLOPATADINE HCL 0.1 % OP SOLN
1.0000 [drp] | Freq: Two times a day (BID) | OPHTHALMIC | Status: DC
  Administered 2024-02-14 – 2024-02-15 (×3): 1 [drp] via OPHTHALMIC
  Filled 2024-02-14: qty 5

## 2024-02-14 MED ORDER — ALBUTEROL SULFATE (2.5 MG/3ML) 0.083% IN NEBU: 2.5000 mg | INHALATION_SOLUTION | RESPIRATORY_TRACT | Status: DC | PRN

## 2024-02-14 MED ORDER — DM-GUAIFENESIN ER 30-600 MG PO TB12
1.0000 | ORAL_TABLET | Freq: Two times a day (BID) | ORAL | Status: DC
  Administered 2024-02-14 – 2024-02-15 (×2): 1 via ORAL
  Filled 2024-02-14 (×2): qty 1

## 2024-02-14 MED ORDER — METHYLPREDNISOLONE SODIUM SUCC 40 MG IJ SOLR: 40.0000 mg | Freq: Two times a day (BID) | INTRAMUSCULAR | Status: DC

## 2024-02-14 MED ORDER — METHYLPREDNISOLONE SODIUM SUCC 40 MG IJ SOLR
40.0000 mg | Freq: Two times a day (BID) | INTRAMUSCULAR | Status: DC
  Administered 2024-02-14 – 2024-02-15 (×2): 40 mg via INTRAVENOUS
  Filled 2024-02-14 (×2): qty 1

## 2024-02-14 MED ORDER — IPRATROPIUM-ALBUTEROL 0.5-2.5 (3) MG/3ML IN SOLN
3.0000 mL | Freq: Four times a day (QID) | RESPIRATORY_TRACT | Status: DC
  Administered 2024-02-14: 3 mL via RESPIRATORY_TRACT
  Filled 2024-02-14 (×2): qty 3

## 2024-02-14 NOTE — Progress Notes (Addendum)
 PROGRESS NOTE  Stephen Buckley, is a 73 y.o. male, DOB - [birth year], FMW:[id number]  Admit date - 02/13/2024   Admitting Physician Tully FORBES Carwin, MD  Outpatient Primary MD for the patient is Bevely Doffing, FNP  LOS - 1  Chief Complaint  Patient presents with   flu-like symptoms       Brief Narrative:  73 y.o. male with medical history significant for stroke, hypertension, seizures, BPH-initially diagnosed with COVID-19 infection on 02/10/2024 failed outpatient Paxlovid , admitted on 02/13/2023 with worsening respiratory symptoms    -Assessment and Plan: 1) sepsis secondary to pneumonia in the setting of COVID-19 virus infection Diagnosed 02/10/24 with COVID.  Worsening cough, dyspnea, with new hypoxia.  Started on Paxlovid  8/19. CT chest WC-right lower lobe pneumonia. ?bacterial superimposed infection.  Showing meeting sepsis criteria with with fever of 101, tachycardia heart rate 85-109, and tachypnea respiratory rate 16-27.  Lactic acid 1.1. - Continue IV ceftriaxone  and IV azithromycin  -Outside window for benefit with remdesivir -Continue IV Sinemet - Okay to complete Paxlovid  course - Treated with IV fluids - Continue bronchodilators and mucolytics  2) acute COPD exacerbation/tobacco abuse--smoking/advised - Antibiotics, mucolytic's and bronchodilators as above #1  Mood disorder (HCC) -Continue donepezil , memantine , Seroquel , varenicline , carbamazepine    CVA (cerebral vascular accident) (HCC) Stable.  Nursing home resident. - Continue aspirin  and atorvastatin  for secondary prevention  Seizure disorder (HCC) On carbamazepine   also on gabapentin .    BPH (benign prostatic hyperplasia) -Continue finasteride , tamsulosin .  Essential hypertension -Continue Norvasc ,   Acute hypoxic respiratory failure--- due to pneumonia above - Hypoxia resolving - Wean off O2  Status is: Inpatient   Disposition: The patient is from: Home              Anticipated d/c is to:  Home              Anticipated d/c date is: 1 day              Patient currently is not medically stable to d/c. Barriers: Not Clinically Stable-   Code Status :  -  Code Status: Full Code   Family Communication:    NA (patient is alert, awake and coherent)   DVT Prophylaxis  :   - SCDs      Lab Results  Component Value Date   PLT 146 (L) 02/14/2024    Inpatient Medications  Scheduled Meds:  enoxaparin  (LOVENOX ) injection  50 mg Subcutaneous Q24H   methylPREDNISolone  (SOLU-MEDROL ) injection  40 mg Intravenous Q12H   olopatadine   1 drop Both Eyes BID   Continuous Infusions:  azithromycin  500 mg (02/14/24 0838)   cefTRIAXone  (ROCEPHIN )  IV 2 g (02/14/24 1314)   PRN Meds:.acetaminophen  **OR** acetaminophen , albuterol , ondansetron  **OR** ondansetron  (ZOFRAN ) IV, polyethylene glycol   Anti-infectives (From admission, onward)    Start     Dose/Rate Route Frequency Ordered Stop   02/14/24 1300  cefTRIAXone  (ROCEPHIN ) 2 g in sodium chloride  0.9 % 100 mL IVPB        2 g 200 mL/hr over 30 Minutes Intravenous Every 24 hours 02/13/24 1946 02/19/24 1259   02/14/24 1000  azithromycin  (ZITHROMAX ) 500 mg in sodium chloride  0.9 % 250 mL IVPB        500 mg 250 mL/hr over 60 Minutes Intravenous Every 24 hours 02/13/24 1946 02/19/24 0959   02/13/24 1315  cefTRIAXone  (ROCEPHIN ) 2 g in sodium chloride  0.9 % 100 mL IVPB        2 g 200 mL/hr over 30 Minutes  Intravenous Once 02/13/24 1303 02/13/24 1400   02/13/24 1315  azithromycin  (ZITHROMAX ) 500 mg in sodium chloride  0.9 % 250 mL IVPB        500 mg 250 mL/hr over 60 Minutes Intravenous  Once 02/13/24 1303 02/13/24 1503         Subjective: [name] today has no fevers, no emesis,  No chest pain,   - Cough and dyspnea improving - Tachycardia and tachypnea improving - Hypoxia resolved   Objective: Vitals:   02/13/24 2007 02/13/24 2317 02/14/24 0330 02/14/24 1343  BP: 117/64 139/69 137/70 130/70  Pulse: 83 91 95 93  Resp:  18 18 18    Temp: 97.9 F (36.6 C) 97.9 F (36.6 C) 98.3 F (36.8 C) 97.9 F (36.6 C)  TempSrc: Oral Oral Oral Oral  SpO2: 98% 94% 93% 97%  Weight:      Height:        Intake/Output Summary (Last 24 hours) at 02/14/2024 1856 Last data filed at 02/14/2024 1752 Gross per 24 hour  Intake 1080 ml  Output 1275 ml  Net -195 ml   Filed Weights   02/13/24 1950  Weight: 100.8 kg    Physical Exam  Gen:- Awake Alert, no conversational dyspnea HEENT:- [place].AT, No sclera icterus Neck-Supple Neck,No JVD,.  Lungs-improving air movement , no wheezing  CV- S1, S2 normal, regular  Abd-  +ve B.Sounds, Abd Soft, No tenderness,    Extremity/Skin:- No  edema, pedal pulses present  Psych-affect is appropriate, oriented x3 Neuro-no new focal deficits, no tremors  Data Reviewed: I have personally reviewed following labs and imaging studies  CBC: Recent Labs  Lab 02/13/24 1340 02/14/24 0520  WBC 9.4 10.3  NEUTROABS 6.9  --   HGB 11.2* 11.9*  HCT 35.0* 36.7*  MCV 91.4 89.7  PLT 128* 146*   Basic Metabolic Panel: Recent Labs  Lab 02/13/24 1340 02/14/24 0520  NA 141 143  K 3.8 3.9  CL 108 113*  CO2 21* 22  GLUCOSE 112* 180*  BUN 9 10  CREATININE 1.01 0.73  CALCIUM  8.0* 8.6*   GFR: Estimated Creatinine Clearance: 98.9 mL/min (by C-G formula based on SCr of 0.73 mg/dL). Liver Function Tests: Recent Labs  Lab 02/13/24 1340  AST 31  ALT 23  ALKPHOS 63  BILITOT 0.6  PROT 6.8  ALBUMIN  3.7   Recent Results (from the past 240 hours)  Blood Culture (routine x 2)     Status: None (Preliminary result)   Collection Time: 02/13/24  1:40 PM   Specimen: BLOOD  Result Value Ref Range Status   Specimen Description BLOOD LEFT ANTECUBITAL  Final   Special Requests   Final    BOTTLES DRAWN AEROBIC ONLY Blood Culture results may not be optimal due to an inadequate volume of blood received in culture bottles   Culture   Final    NO GROWTH < 24 HOURS Performed at [facility],  [address], [place], [place] [address]    Report Status PENDING  Incomplete  Blood Culture (routine x 2)     Status: None (Preliminary result)   Collection Time: 02/13/24  1:52 PM   Specimen: BLOOD  Result Value Ref Range Status   Specimen Description BLOOD BLOOD LEFT FOREARM  Final   Special Requests   Final    BOTTLES DRAWN AEROBIC AND ANAEROBIC Blood Culture adequate volume   Culture   Final    NO GROWTH < 24 HOURS Performed at [facility], [address]  [place], [place] [address]    Report Status PENDING  Incomplete  Resp panel by RT-PCR (RSV, Flu A&B, Covid) Anterior Nasal Swab     Status: Abnormal   Collection Time: 02/13/24  3:37 PM   Specimen: Anterior Nasal Swab  Result Value Ref Range Status   SARS Coronavirus 2 by RT PCR POSITIVE (A) NEGATIVE Final    Comment: (NOTE) SARS-CoV-2 target nucleic acids are DETECTED.  The SARS-CoV-2 RNA is generally detectable in upper respiratory specimens during the acute phase of infection. Positive results are indicative of the presence of the identified virus, but do not rule out bacterial infection or co-infection with other pathogens not detected by the test. Clinical correlation with patient history and other diagnostic information is necessary to determine patient infection status. The expected result is Negative.  Fact Sheet for Patients: BloggerCourse.com  Fact Sheet for Healthcare Providers: SeriousBroker.it  This test is not yet approved or cleared by the [place]  FDA and  has been authorized for detection and/or diagnosis of SARS-CoV-2 by FDA under an Emergency Use Authorization (EUA).  This EUA will remain in effect (meaning this test can be used) for the duration of  the COVID-19 declaration under Section 564(b)(1) of the A ct, 21 U.S.C. section 360bbb-3(b)(1), unless the authorization is terminated or revoked sooner.     Influenza A by PCR NEGATIVE NEGATIVE Final    Influenza B by PCR NEGATIVE NEGATIVE Final    Comment: (NOTE) The Xpert Xpress SARS-CoV-2/FLU/RSV plus assay is intended as an aid in the diagnosis of influenza from Nasopharyngeal swab specimens and should not be used as a sole basis for treatment. Nasal washings and aspirates are unacceptable for Xpert Xpress SARS-CoV-2/FLU/RSV testing.  Fact Sheet for Patients: BloggerCourse.com  Fact Sheet for Healthcare Providers: SeriousBroker.it  This test is not yet approved or cleared by the [place]  FDA and has been authorized for detection and/or diagnosis of SARS-CoV-2 by FDA under an Emergency Use Authorization (EUA). This EUA will remain in effect (meaning this test can be used) for the duration of the COVID-19 declaration under Section 564(b)(1) of the Act, 21 U.S.C. section 360bbb-3(b)(1), unless the authorization is terminated or revoked.     Resp Syncytial Virus by PCR NEGATIVE NEGATIVE Final    Comment: (NOTE) Fact Sheet for Patients: BloggerCourse.com  Fact Sheet for Healthcare Providers: SeriousBroker.it  This test is not yet approved or cleared by the [place]  FDA and has been authorized for detection and/or diagnosis of SARS-CoV-2 by FDA under an Emergency Use Authorization (EUA). This EUA will remain in effect (meaning this test can be used) for the duration of the COVID-19 declaration under Section 564(b)(1) of the Act, 21 U.S.C. section 360bbb-3(b)(1), unless the authorization is terminated or revoked.  Performed at [facility], [address] [place], [place] [address]     Radiology Studies: CT Soft Tissue Neck W Contrast Result Date: 02/13/2024 CLINICAL DATA:  Neck mass, nonpulsatile EXAM: CT NECK WITH CONTRAST TECHNIQUE: Multidetector CT imaging of the neck was performed using the standard protocol following the bolus administration of intravenous  contrast. RADIATION DOSE REDUCTION: This exam was performed according to the departmental dose-optimization program which includes automated exposure control, adjustment of the mA and/or kV according to patient size and/or use of iterative reconstruction technique. CONTRAST:  80mL OMNIPAQUE  IOHEXOL  300 MG/ML  SOLN COMPARISON:  CT 09/03/2017 FINDINGS: Pharynx and larynx: The study suffers somewhat from breathing motion. No mucosal or submucosal mass or inflammatory disease is identified. Salivary glands: Parotid  and submandibular glands are normal. Thyroid : Normal Lymph nodes: No lymphadenopathy on either side of the neck. Normal bilateral cervical chain lymph nodes. Vascular: No abnormal vascular finding. 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 Visualized orbits: Old medial wall blowout fracture of the right orbit. Mastoids and visualized paranasal sinuses: Ordinary seasonal mucosal thickening in the ethmoid sinus regions. Skeleton: Ordinary mid cervical spondylosis. Upper chest: See results of chest CT. Other: None IMPRESSION: 1. No evidence of neck mass or lymphadenopathy. 2. 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 3. Old medial wall blowout fracture of the right orbit. 4. Ordinary seasonal mucosal thickening in the ethmoid sinus regions. Electronically Signed   By: Oneil Officer M.D.   On: 02/13/2024 15:43   CT Chest W Contrast Result Date: 02/13/2024 CLINICAL DATA:  Sepsis EXAM: CT CHEST WITH CONTRAST TECHNIQUE: Multidetector CT imaging of the chest was performed during intravenous contrast administration. RADIATION DOSE REDUCTION: This exam was performed according to the departmental dose-optimization program which includes automated exposure control, adjustment of the mA and/or kV according to patient size and/or use of iterative reconstruction technique. CONTRAST:  80mL OMNIPAQUE  IOHEXOL  300 MG/ML  SOLN COMPARISON:  None Available. FINDINGS: Cardiovascular: No significant vascular findings.  Normal heart size. No pericardial effusion. Mediastinum/Nodes: Multiple small paratracheal prevascular and hilar lymph nodes. Lungs/Pleura: Consolidation in the RIGHT upper lobe with air bronchograms. Patchy nodular airspace disease peripheral to the consolidation. Several airspace nodules in the RIGHT lower lobe additionally Upper Abdomen: Limited view of the liver, kidneys, pancreas are unremarkable. Normal adrenal glands. Musculoskeletal: No aggressive osseous lesion. IMPRESSION: 1. RIGHT upper lobe pneumonia. 2. Reactive mediastinal lymphadenopathy. 3. Followup PA and lateral chest X-ray or CT is recommended in 3-4 weeks following trial of antibiotic therapy to ensure resolution and exclude underlying malignancy. Electronically Signed   By: Jackquline Boxer M.D.   On: 02/13/2024 15:32   DG Chest Port 1 View Result Date: 02/13/2024 CLINICAL DATA:  Sepsis. EXAM: PORTABLE CHEST 1 VIEW COMPARISON:  September 02, 2016. FINDINGS: Stable cardiac size. Lobulated right perihilar density is noted concerning for possible neoplasm. Old left rib fractures are noted. Minimal bibasilar subsegmental atelectasis or scarring is noted. Small pleural effusion may be present. IMPRESSION: Lobulated right perihilar density is noted concerning for possible neoplasm. CT scan of the chest with intravenous contrast is recommended for further evaluation. Electronically Signed   By: Lynwood Landy Raddle M.D.   On: 02/13/2024 13:50   Scheduled Meds:  enoxaparin  (LOVENOX ) injection  50 mg Subcutaneous Q24H   methylPREDNISolone  (SOLU-MEDROL ) injection  40 mg Intravenous Q12H   olopatadine   1 drop Both Eyes BID   Continuous Infusions:  azithromycin  500 mg (02/14/24 0838)   cefTRIAXone  (ROCEPHIN )  IV 2 g (02/14/24 1314)    LOS: 1 day   Rendall Carwin M.D on 02/14/2024 at 6:56 PM  Go to [email or web address] - for contact info  [facility] - Office  [phone]  If 7PM-7AM, please contact night-coverage [email or web address] 02/14/2024,  6:56 PM

## 2024-02-14 NOTE — TOC Progression Note (Signed)
  Transition of Care [facility]) - Inpatient Brief Assessment   Patient Details  Name: Stephen Buckley MRN: [id number] Date of Birth: [birth year]  [facility] CM/SW Contact:    Sharlyne Stabs, RN Phone Number: 02/14/2024, 7:36 AM   Clinical Narrative:  Patient from [facility], Work up continues, No PT order as of yet, FL2 started, TOC following.    Transition of Care Asessment: Insurance and Status: Insurance coverage has been reviewed Patient has primary care physician: Yes Home environment has been reviewed: From [facility] Prior level of function:: Needs assistance Prior/Current Home Services: No current home services Social Drivers of Health Review: SDOH reviewed no interventions necessary Readmission risk has been reviewed: Yes Transition of care needs: no transition of care needs at this time     Expected Discharge Plan: Assisted Living Barriers to Discharge: Continued Medical Work up    Social Drivers of Health (SDOH) Interventions SDOH Screenings   Food Insecurity: No Food Insecurity (02/13/2024)  Housing: Low Risk  (02/13/2024)  Transportation Needs: No Transportation Needs (02/13/2024)  Utilities: Not At Risk (02/13/2024)  Alcohol Screen: Low Risk  (01/08/2024)  Depression (PHQ2-9): Low Risk  (01/08/2024)  Recent Concern: Depression (PHQ2-9) - Medium Risk (10/14/2023)  Financial Resource Strain: Low Risk  (01/08/2024)  Physical Activity: Inactive (01/08/2024)  Social Connections: Moderately Integrated (02/13/2024)  Stress: No Stress Concern Present (01/08/2024)  Tobacco Use: High Risk (02/13/2024)  Health Literacy: Adequate Health Literacy (01/08/2024)    Readmission Risk Interventions     No data to display

## 2024-02-14 NOTE — Plan of Care (Signed)
  Problem: Education: Goal: Knowledge of risk factors and measures for prevention of condition will improve Outcome: Progressing   Problem: Coping: Goal: Psychosocial and spiritual needs will be supported Outcome: Progressing   Problem: Respiratory: Goal: Will maintain a patent airway Outcome: Progressing Goal: Complications related to the disease process, condition or treatment will be avoided or minimized Outcome: Progressing   Problem: Education: Goal: Knowledge of General Education information will improve Description: Including pain rating scale, medication(s)/side effects and non-pharmacologic comfort measures Outcome: Progressing   Problem: Health Behavior/Discharge Planning: Goal: Ability to manage health-related needs will improve Outcome: Progressing   Problem: Clinical Measurements: Goal: Ability to maintain clinical measurements within normal limits will improve Outcome: Progressing Goal: Will remain free from infection Outcome: Progressing   

## 2024-02-15 DIAGNOSIS — J1282 Pneumonia due to coronavirus disease 2019: Secondary | ICD-10-CM | POA: Diagnosis not present

## 2024-02-15 DIAGNOSIS — U071 COVID-19: Secondary | ICD-10-CM | POA: Diagnosis not present

## 2024-02-15 MED ORDER — AMLODIPINE BESYLATE 10 MG PO TABS: 10.0000 mg | ORAL_TABLET | Freq: Every day | ORAL | 5 refills | Status: AC

## 2024-02-15 MED ORDER — ALBUTEROL SULFATE (2.5 MG/3ML) 0.083% IN NEBU: 2.5000 mg | INHALATION_SOLUTION | RESPIRATORY_TRACT | 12 refills | Status: AC | PRN

## 2024-02-15 MED ORDER — COMPRESSOR/NEBULIZER MISC: 1.0000 [IU] | Freq: Every day | 0 refills | Status: AC | PRN

## 2024-02-15 MED ORDER — ASPIRIN 81 MG PO TBEC: 81.0000 mg | DELAYED_RELEASE_TABLET | Freq: Every day | ORAL | 3 refills | Status: AC

## 2024-02-15 MED ORDER — TAMSULOSIN HCL 0.4 MG PO CAPS
0.4000 mg | ORAL_CAPSULE | Freq: Every day | ORAL | 3 refills | Status: AC
Start: 2024-02-15 — End: ?

## 2024-02-15 MED ORDER — CARBAMAZEPINE 200 MG PO TABS: ORAL_TABLET | ORAL | 2 refills | Status: AC

## 2024-02-15 MED ORDER — ANORO ELLIPTA 62.5-25 MCG/ACT IN AEPB
1.0000 | INHALATION_SPRAY | Freq: Every day | RESPIRATORY_TRACT | 5 refills | Status: AC
Start: 2024-02-15 — End: ?

## 2024-02-15 MED ORDER — AZITHROMYCIN 500 MG PO TABS: 500.0000 mg | ORAL_TABLET | Freq: Every day | ORAL | 0 refills | Status: AC

## 2024-02-15 MED ORDER — VITAMIN D (ERGOCALCIFEROL) 1.25 MG (50000 UNIT) PO CAPS: 50000.0000 [IU] | ORAL_CAPSULE | ORAL | 3 refills | Status: AC

## 2024-02-15 MED ORDER — CEFDINIR 300 MG PO CAPS: 300.0000 mg | ORAL_CAPSULE | Freq: Two times a day (BID) | ORAL | 0 refills | Status: AC

## 2024-02-15 MED ORDER — AZITHROMYCIN 250 MG PO TABS
500.0000 mg | ORAL_TABLET | Freq: Once | ORAL | Status: AC
  Administered 2024-02-15: 500 mg via ORAL
  Filled 2024-02-15: qty 2

## 2024-02-15 MED ORDER — PANTOPRAZOLE SODIUM 40 MG PO TBEC: 40.0000 mg | DELAYED_RELEASE_TABLET | Freq: Every day | ORAL | 3 refills | Status: AC

## 2024-02-15 MED ORDER — SODIUM CHLORIDE 0.9 % IV SOLN
2.0000 g | INTRAVENOUS | Status: DC
  Administered 2024-02-15: 2 g via INTRAVENOUS
  Filled 2024-02-15: qty 20

## 2024-02-15 MED ORDER — PREDNISONE 20 MG PO TABS: 40.0000 mg | ORAL_TABLET | Freq: Every day | ORAL | 0 refills | Status: AC

## 2024-02-15 MED ORDER — SODIUM CHLORIDE 0.9 % IV SOLN
500.0000 mg | INTRAVENOUS | Status: DC
  Filled 2024-02-15: qty 5

## 2024-02-15 MED ORDER — ATORVASTATIN CALCIUM 80 MG PO TABS: 80.0000 mg | ORAL_TABLET | Freq: Every day | ORAL | 5 refills | Status: AC

## 2024-02-15 MED ORDER — QUETIAPINE FUMARATE 50 MG PO TABS: 50.0000 mg | ORAL_TABLET | Freq: Every morning | ORAL | 5 refills | Status: AC

## 2024-02-15 MED ORDER — GUAIFENESIN ER 600 MG PO TB12: 600.0000 mg | ORAL_TABLET | Freq: Two times a day (BID) | ORAL | 0 refills | Status: DC

## 2024-02-15 MED ORDER — QUETIAPINE FUMARATE 200 MG PO TABS: 200.0000 mg | ORAL_TABLET | Freq: Every day | ORAL | 2 refills | Status: AC

## 2024-02-15 NOTE — Plan of Care (Signed)
  Problem: Respiratory: Goal: Will maintain a patent airway Outcome: Progressing Goal: Complications related to the disease process, condition or treatment will be avoided or minimized Outcome: Progressing   Problem: Clinical Measurements: Goal: Will remain free from infection Outcome: Progressing   Problem: Nutrition: Goal: Adequate nutrition will be maintained Outcome: Progressing   Problem: Coping: Goal: Level of anxiety will decrease Outcome: Progressing
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
## 2024-02-15 NOTE — Discharge Instructions (Signed)
 1)You are strongly advised to isolate/quarantine for at least 5 days from the date of your diagnosis with COVID-19 infection--you were initially Diagnosed on 02/10/2024--please always wear a mask if you have to go outside the house  2)Please take medications as prescribed--- please note that there were some changes to your medications

## 2024-02-15 NOTE — NC FL2 (Signed)
 [place]  MEDICAID FL2 LEVEL OF CARE FORM     IDENTIFICATION  Patient Name: Stephen Buckley Birthdate: 03/20/1951 Sex: male Admission Date (Current Location): 02/13/2024  [place] and [place] Number:  Stephen Buckley [id number] Facility and Address:  [facility],  [address], [place] [address]      Provider Number: [id number]  Attending Physician Name and Address:  Pearlean Manus, MD  Relative Name and Phone Number:  Kaan, Tosh (Spouse)  [phone]    Current Level of Care: Hospital Recommended Level of Care: Assisted Living Facility Prior Approval Number:    Date Approved/Denied:   PASRR Number:    Discharge Plan: Other (Comment) (ALF)    Current Diagnoses: Patient Active Problem List   Diagnosis Date Noted   Pneumonia due to COVID-19 virus 02/13/2024   COVID 02/11/2024   History of fracture of right ankle 09/24/2023   Bilateral hand pain 03/25/2023   Need for influenza vaccination 03/25/2023   Chronic right hip pain 01/10/2023   Need for zoster vaccination 09/04/2022   Need for Tdap vaccination 09/04/2022   Neuropathy 07/25/2022   Dry eyes 07/25/2022   History of substance abuse (HCC) 01/15/2022   Cerumen impaction 01/15/2022   Encounter for general adult medical examination with abnormal findings 01/15/2022   Vitamin D  deficiency 01/15/2022   Mood disorder (HCC) 01/10/2022   Tension headache 12/26/2021   Prediabetes 05/24/2021   History of colonic polyps 04/02/2019   Gastroesophageal reflux disease 04/02/2019   History of hepatitis C virus infection 04/02/2019   BPH (benign prostatic hyperplasia) 08/21/2018   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 09/06/2017   CVA (cerebral vascular accident) (HCC) 09/03/2017   Seizure disorder (HCC) 11/29/2016   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/01/2016   Seasonal allergies 12/30/2015   Essential  hypertension 08/15/2015   Dyslipidemia 08/15/2015   Smokers' cough (HCC) 03/16/2015   Obesity 08/28/2013   Tobacco dependence 08/07/2013   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   TBI (traumatic brain injury) (HCC) 03/04/2012    Orientation RESPIRATION BLADDER Height & Weight     Self, Situation, Place, Time  Normal Continent Weight: 100.8 kg Height:  5' 8 (172.7 cm)  BEHAVIORAL SYMPTOMS/MOOD NEUROLOGICAL BOWEL NUTRITION STATUS      Continent Diet (see dc summary)  AMBULATORY STATUS COMMUNICATION OF NEEDS Skin   Limited Assist Verbally Normal                       Personal Care Assistance Level of Assistance  Bathing, Feeding, Dressing Bathing Assistance: Limited assistance Feeding assistance: Independent Dressing Assistance: Limited assistance     Functional Limitations Info  Sight, Hearing, Speech Sight Info: Impaired        SPECIAL CARE FACTORS FREQUENCY                       Contractures Contractures Info: Not present    Additional Factors Info  Code Status, Allergies Code Status Info: FULL Allergies Info: NKDA           Current Medications (02/15/2024):   Allergies as of 02/15/2024   No Known Allergies      Medication List     STOP taking these medications    meloxicam  7.5 MG tablet Commonly known as: Mobic    nirmatrelvir /ritonavir  20 x 150 MG & 10 x 100MG  Tabs Commonly known as: PAXLOVID        TAKE these medications  acetaminophen  500 MG tablet Commonly known as: TYLENOL  Take 1 tablet (500 mg total) by mouth every 4 (four) hours as needed for moderate pain.   albuterol  (2.5 MG/3ML) 0.083% nebulizer solution Commonly known as: PROVENTIL  Take 3 mLs (2.5 mg total) by nebulization every 2 (two) hours as needed for wheezing or shortness of breath.   amLODipine  10 MG tablet Commonly known as: NORVASC  Take 1 tablet (10 mg total) by mouth at bedtime.   Anoro Ellipta  62.5-25 MCG/ACT Aepb Generic drug:  umeclidinium-vilanterol Inhale 1 puff into the lungs daily.   aspirin  EC 81 MG tablet Commonly known as: Aspirin  Low Dose Take 1 tablet (81 mg total) by mouth daily with breakfast. What changed: when to take this   atorvastatin  80 MG tablet Commonly known as: LIPITOR Take 1 tablet (80 mg total) by mouth daily.   azithromycin  500 MG tablet Commonly known as: ZITHROMAX  Take 1 tablet (500 mg total) by mouth daily for 3 days.   Blood Pressure Monitor Automat Devi 1 kit by Does not apply route daily. Please call the office if you have any issues obtaining this monitor.   carbamazepine  200 MG tablet Commonly known as: TEGRETOL  TAKE 1 TABLET BY MOUTH IN THE MORNING AND TAKE 1 & 1/2 (ONE & ONE-HALF) TABLET BY MOUTH AT BEDTIME What changed:  how much to take how to take this when to take this additional instructions   carboxymethylcellulose 1 % ophthalmic solution Apply 1 drop to eye as needed (dry/itching eyes). What changed: additional instructions   cefdinir  300 MG capsule Commonly known as: OMNICEF  Take 1 capsule (300 mg total) by mouth 2 (two) times daily for 5 days.   cetirizine  10 MG tablet Commonly known as: ZYRTEC  TAKE 1 TABLET BY MOUTH ONCE DAILY   Compressor/Nebulizer Misc 1 Units by Does not apply route daily as needed.   cyanocobalamin  1000 MCG tablet Commonly known as: VITAMIN B12 TAKE 1 TABLET BY MOUTH ONCE DAILY   dextromethorphan  15 MG/5ML syrup Take 20 mLs by mouth as needed for cough.   donepezil  10 MG tablet Commonly known as: ARICEPT  Take 10 mg by mouth at bedtime.   EYE WASH OP Place 1 drop into both eyes daily as needed (eye wash). Boric Acid   finasteride  5 MG tablet Commonly known as: PROSCAR  TAKE 1 TABLET BY MOUTH DAILY   gabapentin  300 MG capsule Commonly known as: NEURONTIN  Take 1 (one) capsule BY MOUTH 3 TIMES DAILY What changed: See the new instructions.   guaiFENesin  600 MG 12 hr tablet Commonly known as: Mucinex  Take 1 tablet  (600 mg total) by mouth 2 (two) times daily.   memantine  10 MG tablet Commonly known as: NAMENDA  TAKE 1 TABLET BY MOUTH TWICE DAILY   olopatadine  0.1 % ophthalmic solution Commonly known as: PATANOL Place 1 drop into both eyes daily as needed for allergies. Generic: Pataday  Twice Daily Relief   pantoprazole  40 MG tablet Commonly known as: PROTONIX  Take 1 tablet (40 mg total) by mouth daily.   predniSONE  20 MG tablet Commonly known as: DELTASONE  Take 2 tablets (40 mg total) by mouth daily with breakfast for 5 days.   QUEtiapine  50 MG tablet Commonly known as: SEROQUEL  Take 1 tablet (50 mg total) by mouth in the morning.   QUEtiapine  200 MG tablet Commonly known as: SEROQUEL  Take 1 tablet (200 mg total) by mouth at bedtime.   tamsulosin  0.4 MG Caps capsule Commonly known as: FLOMAX  Take 1 capsule (0.4 mg total) by mouth daily after  supper. What changed: See the new instructions.   varenicline  1 MG tablet Commonly known as: CHANTIX  TAKE 1 TABLET BY MOUTH TWICE DAILY   Vitamin D  (Ergocalciferol ) 1.25 MG (50000 UNIT) Caps capsule Commonly known as: DRISDOL  Take 1 capsule (50,000 Units total) by mouth every 7 (seven) days. Saturdays               Durable Medical Equipment  (From admission, onward)           Start     Ordered   02/15/24 0000  For home use only DME Nebulizer machine       Question Answer Comment  Patient needs a nebulizer to treat with the following condition Dyspnea and respiratory abnormalities   Length of Need Lifetime   Additional equipment included Administration kit   Additional equipment included Filter      02/15/24 1006            Current Facility-Administered Medications  Medication Dose Route Frequency Provider Last Rate Last Admin   acetaminophen  (TYLENOL ) tablet 650 mg  650 mg Oral Q6H PRN Emokpae, Ejiroghene E, MD       Or   acetaminophen  (TYLENOL ) suppository 650 mg  650 mg Rectal Q6H PRN Emokpae, Ejiroghene E, MD        albuterol  (PROVENTIL ) (2.5 MG/3ML) 0.083% nebulizer solution 2.5 mg  2.5 mg Nebulization Q2H PRN Emokpae, Courage, MD       azithromycin  (ZITHROMAX ) tablet 500 mg  500 mg Oral Once Emokpae, Courage, MD       cefTRIAXone  (ROCEPHIN ) 2 g in sodium chloride  0.9 % 100 mL IVPB  2 g Intravenous Q24H Emokpae, Courage, MD 200 mL/hr at 02/15/24 0912 2 g at 02/15/24 0912   dextromethorphan -guaiFENesin  (MUCINEX  DM) 30-600 MG per 12 hr tablet 1 tablet  1 tablet Oral BID Emokpae, Courage, MD   1 tablet at 02/15/24 0918   enoxaparin  (LOVENOX ) injection 50 mg  50 mg Subcutaneous Q24H Grubb, Rodney D, RPH   50 mg at 02/14/24 2109   ipratropium-albuterol  (DUONEB) 0.5-2.5 (3) MG/3ML nebulizer solution 3 mL  3 mL Nebulization QID Emokpae, Courage, MD   3 mL at 02/14/24 2013   methylPREDNISolone  sodium succinate (SOLU-MEDROL ) 40 mg/mL injection 40 mg  40 mg Intravenous Q12H Emokpae, Courage, MD   40 mg at 02/15/24 9081   olopatadine  (PATANOL) 0.1 % ophthalmic solution 1 drop  1 drop Both Eyes BID Emokpae, Courage, MD   1 drop at 02/14/24 2109   ondansetron  (ZOFRAN ) tablet 4 mg  4 mg Oral Q6H PRN Emokpae, Ejiroghene E, MD       Or   ondansetron  (ZOFRAN ) injection 4 mg  4 mg Intravenous Q6H PRN Emokpae, Ejiroghene E, MD       polyethylene glycol (MIRALAX  / GLYCOLAX ) packet 17 g  17 g Oral Daily PRN Emokpae, Ejiroghene E, MD         Discharge Medications: Please see discharge summary for a list of discharge medications.  Relevant Imaging Results:  Relevant Lab Results:   Additional Information SS# [id number]  Nena LITTIE Coffee, RN

## 2024-02-15 NOTE — TOC Transition Note (Signed)
 Transition of Care [facility]) - Discharge Note  Patient Details  Name: Stephen Buckley MRN: [id number] Date of Birth: 25-May-1951  Transition of Care [facility]) CM/SW Contact:  Nena LITTIE Coffee, RN Phone Number: 02/15/2024, 3:49 PM  Clinical Narrative:    Pt discharged back to [facility]F. Prescription for nebulizer machine sent to [facility] [place]. FL2 and DC summary faxed to Kim at [phone]. Kim called to confirm receipt and approve return.   Final next level of care: Assisted Living Barriers to Discharge: Barriers Resolved  Patient Goals and CMS Choice Patient states their goals for this hospitalization and ongoing recovery are:: Return to ALF   Discharge Placement   Discharge Plan and Services Additional resources added to the After Visit Summary for               DME Arranged: Nebulizer machine   Social Drivers of Health (SDOH) Interventions SDOH Screenings   Food Insecurity: No Food Insecurity (02/13/2024)  Housing: Low Risk  (02/13/2024)  Transportation Needs: No Transportation Needs (02/13/2024)  Utilities: Not At Risk (02/13/2024)  Alcohol Screen: Low Risk  (01/08/2024)  Depression (PHQ2-9): Low Risk  (01/08/2024)  Recent Concern: Depression (PHQ2-9) - Medium Risk (10/14/2023)  Financial Resource Strain: Low Risk  (01/08/2024)  Physical Activity: Inactive (01/08/2024)  Social Connections: Moderately Integrated (02/13/2024)  Stress: No Stress Concern Present (01/08/2024)  Tobacco Use: High Risk (02/13/2024)  Health Literacy: Adequate Health Literacy (01/08/2024)   Readmission Risk Interventions     No data to display

## 2024-02-19 DIAGNOSIS — Z79899 Other long term (current) drug therapy: Secondary | ICD-10-CM | POA: Diagnosis not present

## 2024-02-19 LAB — CULTURE, BLOOD (ROUTINE X 2)
Culture: NO GROWTH
Culture: NO GROWTH
Special Requests: ADEQUATE

## 2024-02-25 ENCOUNTER — Other Ambulatory Visit: Payer: Self-pay | Admitting: Internal Medicine

## 2024-02-25 ENCOUNTER — Other Ambulatory Visit: Payer: Self-pay

## 2024-02-25 DIAGNOSIS — J302 Other seasonal allergic rhinitis: Secondary | ICD-10-CM

## 2024-02-26 ENCOUNTER — Other Ambulatory Visit: Payer: Self-pay

## 2024-02-26 DIAGNOSIS — G629 Polyneuropathy, unspecified: Secondary | ICD-10-CM

## 2024-03-03 ENCOUNTER — Other Ambulatory Visit: Payer: Self-pay | Admitting: Internal Medicine

## 2024-03-03 DIAGNOSIS — H04123 Dry eye syndrome of bilateral lacrimal glands: Secondary | ICD-10-CM

## 2024-03-03 DIAGNOSIS — J302 Other seasonal allergic rhinitis: Secondary | ICD-10-CM

## 2024-03-05 ENCOUNTER — Inpatient Hospital Stay: Payer: Self-pay | Admitting: Nurse Practitioner

## 2024-03-06 ENCOUNTER — Ambulatory Visit

## 2024-03-06 VITALS — BP 152/87 | HR 89 | Ht 68.0 in | Wt 224.1 lb

## 2024-03-06 DIAGNOSIS — U071 COVID-19: Secondary | ICD-10-CM

## 2024-03-06 DIAGNOSIS — H04123 Dry eye syndrome of bilateral lacrimal glands: Secondary | ICD-10-CM | POA: Diagnosis not present

## 2024-03-06 DIAGNOSIS — H6123 Impacted cerumen, bilateral: Secondary | ICD-10-CM | POA: Diagnosis not present

## 2024-03-06 DIAGNOSIS — F17200 Nicotine dependence, unspecified, uncomplicated: Secondary | ICD-10-CM

## 2024-03-06 DIAGNOSIS — I10 Essential (primary) hypertension: Secondary | ICD-10-CM | POA: Diagnosis not present

## 2024-03-06 DIAGNOSIS — J1282 Pneumonia due to coronavirus disease 2019: Secondary | ICD-10-CM

## 2024-03-06 DIAGNOSIS — J410 Simple chronic bronchitis: Secondary | ICD-10-CM

## 2024-03-06 MED ORDER — GUAIFENESIN ER 600 MG PO TB12: 600.0000 mg | ORAL_TABLET | Freq: Two times a day (BID) | ORAL | 2 refills | Status: DC

## 2024-03-06 NOTE — Progress Notes (Signed)
 Established Patient Office Visit  Subjective   Patient ID: Stephen Buckley, male    DOB: 1951-01-30  Age: 73 y.o. MRN: [id number]  Chief Complaint  Patient presents with   Hospitalization Follow-up    Hospital follow up   HPI Hospital admission from 8/21-8/23/25 72 y.o. male with medical history significant for stroke, hypertension, seizures, BPH-initially diagnosed with COVID-19 infection on 02/10/2024 failed outpatient Paxlovid , admitted on 02/13/2023 with worsening respiratory symptoms    -Assessment and Plan: 1)Sepsis secondary to pneumonia in the setting of COVID-19 virus infection--POA - Patient met sepsis criteria on admission Diagnosed with COVID initially on 02/10/24  - Admitted on 02/13/2024 with worsening cough, dyspnea, with new hypoxia.  Started on Paxlovid  02/11/24-, completed on 02/15/2024 . CT chest WC-right lower lobe pneumonia. ?bacterial superimposed infection.  - Treated with IV ceftriaxone  and IV azithromycin  -Outside window for benefit with remdesivir Treated with IV Solu-Medrol  - Treated with IV fluids -Overall much improved -No further hypoxia -Okay to discharge on bronchodilators, mucolytic's azithromycin  and Omnicef  as well as prednisone    2) acute COPD exacerbation/tobacco abuse--smoking cessation advised - Antibiotics, mucolytic's and bronchodilators as above #1 -Continue Chantix  for smoking cessation   Mood disorder --stable, -Continue donepezil , memantine , Seroquel , varenicline , carbamazepine  CVA (cerebral vascular accident) (HCC) Stable.  - Continue aspirin  and atorvastatin  for secondary prevention Seizure disorder (HCC) On carbamazepine   also on gabapentin .   Dementia--- stable, continue Aricept  and Namenda  BPH (benign prostatic hyperplasia) -Continue finasteride , tamsulosin . Essential hypertension -Continue Norvasc ,  Acute hypoxic respiratory failure--- due to pneumonia as above with #1 - Hypoxia is resolved with treatment as above  #1 -Post ambulation O2 sat 95 to 96% on room air Mild acute anemia--no bleeding concerns -Maybe related to hemodilution from fluids -Repeat CBC as outpatient with PCP advised   Disposition: The patient is from: Home              Anticipated d/c is to: Home   Discharge Condition: Stable without hypoxia    Patient Active Problem List   Diagnosis Date Noted   Pneumonia due to COVID-19 virus 02/13/2024   COVID 02/11/2024   History of fracture of right ankle 09/24/2023   Bilateral hand pain 03/25/2023   Need for influenza vaccination 03/25/2023   Chronic right hip pain 01/10/2023   Need for zoster vaccination 09/04/2022   Need for Tdap vaccination 09/04/2022   Neuropathy 07/25/2022   Dry eyes, bilateral 07/25/2022   History of substance abuse (HCC) 01/15/2022   Cerumen impaction 01/15/2022   Encounter for general adult medical examination with abnormal findings 01/15/2022   Vitamin D  deficiency 01/15/2022   Mood disorder (HCC) 01/10/2022   Tension headache 12/26/2021   Prediabetes 05/24/2021   History of colonic polyps 04/02/2019   Gastroesophageal reflux disease 04/02/2019   History of hepatitis C virus infection 04/02/2019   BPH (benign prostatic hyperplasia) 08/21/2018   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 09/06/2017   CVA (cerebral vascular accident) (HCC) 09/03/2017   Seizure disorder (HCC) 11/29/2016   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/01/2016   Seasonal allergies 12/30/2015   Essential hypertension 08/15/2015   Dyslipidemia 08/15/2015   Smokers' cough (HCC) 03/16/2015   Obesity 08/28/2013   Tobacco dependence 08/07/2013   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  Constitutional:      Appearance: Normal appearance.  HENT:     Head: Normocephalic.  Eyes:     Extraocular Movements: Extraocular movements intact.     Pupils: Pupils are equal, round, and reactive to light.  Cardiovascular:     Rate and Rhythm: Normal rate and regular rhythm.  Pulmonary:     Effort: Pulmonary effort is normal.     Breath sounds: Normal breath sounds.  Musculoskeletal:     Cervical back: Normal range of motion and neck supple.  Neurological:     Mental Status: He is alert and oriented to person, place, and time.  Psychiatric:        Mood and Affect: Mood normal.        Thought Content: Thought content normal.    No results found for any visits on 03/06/24.  Last CBC Lab Results  Component Value Date   WBC 10.3 02/14/2024   HGB 11.9 (L) 02/14/2024   HCT 36.7 (L) 02/14/2024   MCV 89.7 02/14/2024   MCH 29.1 02/14/2024   RDW 15.6 (H) 02/14/2024   PLT 146 (L) 02/14/2024   Last metabolic panel Lab Results  Component Value Date   GLUCOSE 180 (H) 02/14/2024   NA 143 02/14/2024   K 3.9 02/14/2024   CL 113 (H) 02/14/2024   CO2 22 02/14/2024   BUN 10 02/14/2024   CREATININE 0.73 02/14/2024   GFRNONAA >60 02/14/2024   CALCIUM  8.6 (L) 02/14/2024   PROT 6.8 02/13/2024   ALBUMIN  3.7 02/13/2024   LABGLOB 2.9 10/14/2023   AGRATIO 2.0 05/23/2022   BILITOT 0.6 02/13/2024   ALKPHOS 63 02/13/2024   AST 31 02/13/2024   ALT 23 02/13/2024   ANIONGAP 8 02/14/2024   Last lipids Lab Results  Component Value Date   CHOL 138 10/14/2023   HDL 30 (L) 10/14/2023   LDLCALC 79 10/14/2023   LDLDIRECT 100 (H) 07/31/2013   TRIG 165 (H) 10/14/2023   CHOLHDL 4.6 10/14/2023   Last hemoglobin A1c Lab  Results  Component Value Date   HGBA1C 6.8 (H) 10/14/2023   Last thyroid  functions Lab Results  Component Value Date   TSH 1.620 10/14/2023   Last vitamin D  Lab Results  Component Value Date   VD25OH 12.1 (L) 10/14/2023   Last vitamin B12 and Folate Lab Results  Component Value Date   VITAMINB12 858 10/14/2023   FOLATE 10.2 10/14/2023      The ASCVD Risk score (Arnett DK, et al., 2019) failed to calculate for the following reasons:   Risk score cannot be calculated because patient has a medical history suggesting prior/existing ASCVD    Assessment & Plan:   Problem List Items Addressed This Visit       Cardiovascular and Mediastinum   Essential hypertension   Stable with amlodipine  10 mg.  Continue with low-sodium diet and regular physical activity, aiming for 150 minutes per week.         Respiratory   Smokers' cough (HCC)   Recommend smoking cessation.  Mucinex  refilled for ongoing chest congestion and cough.  Recommend increased intake of water while taking.        Relevant Medications   guaiFENesin  (MUCINEX ) 600 MG 12 hr tablet   Pneumonia due to COVID-19 virus   Stable after recent hospitalization.  Mucinex  refilled for chest congestion.       Relevant Medications   guaiFENesin  (MUCINEX ) 600 MG 12 hr tablet     Nervous and Auditory   Cerumen impaction - Primary   Cerumen obstructions of bilateral successfully cleared with gentle irrigation and cerumen spoon.  No complications from procedure.  Hearing subjectively improved.         Other   Dry eyes, bilateral   Relevant Orders   Ambulatory referral to Ophthalmology   Return in about 3 months (around 06/05/2024) for chronic follow-up with PCP.    Leita Longs, FNP

## 2024-03-09 NOTE — Assessment & Plan Note (Signed)
 Recommend smoking cessation.  Mucinex  refilled for ongoing chest congestion and cough.  Recommend increased intake of water while taking.

## 2024-03-09 NOTE — Assessment & Plan Note (Signed)
 Cerumen obstructions of bilateral successfully cleared with gentle irrigation and cerumen spoon.  No complications from procedure.  Hearing subjectively improved.

## 2024-03-09 NOTE — Assessment & Plan Note (Signed)
 Stable after recent hospitalization.  Mucinex  refilled for chest congestion.

## 2024-03-09 NOTE — Assessment & Plan Note (Signed)
 Stable with amlodipine  10 mg.  Continue with low-sodium diet and regular physical activity, aiming for 150 minutes per week.

## 2024-03-17 DIAGNOSIS — H25013 Cortical age-related cataract, bilateral: Secondary | ICD-10-CM | POA: Diagnosis not present

## 2024-03-17 DIAGNOSIS — H2513 Age-related nuclear cataract, bilateral: Secondary | ICD-10-CM | POA: Diagnosis not present

## 2024-03-18 DIAGNOSIS — Z79899 Other long term (current) drug therapy: Secondary | ICD-10-CM | POA: Diagnosis not present

## 2024-03-20 DIAGNOSIS — Z79899 Other long term (current) drug therapy: Secondary | ICD-10-CM | POA: Diagnosis not present

## 2024-04-03 ENCOUNTER — Other Ambulatory Visit: Payer: Self-pay

## 2024-04-03 DIAGNOSIS — G629 Polyneuropathy, unspecified: Secondary | ICD-10-CM

## 2024-04-09 ENCOUNTER — Encounter: Payer: Self-pay | Admitting: Gastroenterology

## 2024-04-14 ENCOUNTER — Ambulatory Visit

## 2024-04-30 ENCOUNTER — Other Ambulatory Visit: Payer: Self-pay | Admitting: Internal Medicine

## 2024-04-30 ENCOUNTER — Other Ambulatory Visit: Payer: Self-pay

## 2024-04-30 DIAGNOSIS — G629 Polyneuropathy, unspecified: Secondary | ICD-10-CM

## 2024-05-19 ENCOUNTER — Ambulatory Visit: Payer: Self-pay

## 2024-05-19 NOTE — Telephone Encounter (Signed)
Noted patient scheduled

## 2024-05-19 NOTE — Telephone Encounter (Signed)
 FYI Only or Action Required?: FYI only for provider: appointment scheduled on 12/1.  Patient was last seen in primary care on 03/06/2024 by Bevely Doffing, FNP.  Called Nurse Triage reporting Nasal Congestion.  Symptoms began 1-2 weeks ago.  Symptoms are: unchanged.  Triage Disposition: See PCP When Office is Open (Within 3 Days)  Patient/caregiver understands and will follow disposition?: Yes with modifications, patient not available until next week, appointment scheduled on 12/1      Reason for Triage: pt is having congestion and insisted on getting an appt with Dr.Huenink but there's no availability available today please follow up with wife about this [phone]        Reason for Disposition  [1] Sinus congestion (pressure, fullness) AND [2] present > 10 days  Answer Assessment - Initial Assessment Questions 1. LOCATION: Where does it hurt?      No pain 2. ONSET: When did the sinus pain start?  (e.g., hours, days)      1-2 weeks  3. SEVERITY: How bad is the pain?   (Scale 0-10; or none, mild, moderate or severe)     No pain 4. RECURRENT SYMPTOM: Have you ever had sinus problems before? If Yes, ask: When was the last time? and What happened that time?      Yes 5. NASAL CONGESTION: Is the nose blocked? If Yes, ask: Can you open it or must you breathe through your mouth?     Yes 6. NASAL DISCHARGE: Do you have discharge from your nose? If so ask, What color?     Yes 7. FEVER: Do you have a fever? If Yes, ask: What is it, how was it measured, and when did it start?      No 8. OTHER SYMPTOMS: Do you have any other symptoms? (e.g., sore throat, cough, earache, difficulty breathing)     No  Protocols used: Sinus Pain or Congestion-A-AH

## 2024-05-23 ENCOUNTER — Encounter: Payer: Self-pay | Admitting: Gastroenterology

## 2024-05-25 ENCOUNTER — Ambulatory Visit: Payer: Self-pay | Admitting: Family Medicine

## 2024-05-27 ENCOUNTER — Other Ambulatory Visit: Payer: Self-pay

## 2024-05-27 DIAGNOSIS — J410 Simple chronic bronchitis: Secondary | ICD-10-CM

## 2024-05-28 ENCOUNTER — Other Ambulatory Visit: Payer: Self-pay

## 2024-06-01 ENCOUNTER — Other Ambulatory Visit: Payer: Self-pay

## 2024-06-01 DIAGNOSIS — J302 Other seasonal allergic rhinitis: Secondary | ICD-10-CM

## 2024-06-01 DIAGNOSIS — G629 Polyneuropathy, unspecified: Secondary | ICD-10-CM

## 2024-06-16 ENCOUNTER — Ambulatory Visit (INDEPENDENT_AMBULATORY_CARE_PROVIDER_SITE_OTHER)

## 2024-06-16 VITALS — BP 140/82 | HR 103 | Ht 68.0 in | Wt 221.0 lb

## 2024-06-16 DIAGNOSIS — G629 Polyneuropathy, unspecified: Secondary | ICD-10-CM | POA: Diagnosis not present

## 2024-06-16 DIAGNOSIS — H04123 Dry eye syndrome of bilateral lacrimal glands: Secondary | ICD-10-CM

## 2024-06-16 DIAGNOSIS — S069X9S Unspecified intracranial injury with loss of consciousness of unspecified duration, sequela: Secondary | ICD-10-CM | POA: Diagnosis not present

## 2024-06-16 DIAGNOSIS — R7303 Prediabetes: Secondary | ICD-10-CM | POA: Diagnosis not present

## 2024-06-16 DIAGNOSIS — F02818 Dementia in other diseases classified elsewhere, unspecified severity, with other behavioral disturbance: Secondary | ICD-10-CM | POA: Diagnosis not present

## 2024-06-16 DIAGNOSIS — E559 Vitamin D deficiency, unspecified: Secondary | ICD-10-CM | POA: Diagnosis not present

## 2024-06-16 DIAGNOSIS — E669 Obesity, unspecified: Secondary | ICD-10-CM | POA: Diagnosis not present

## 2024-06-16 DIAGNOSIS — I10 Essential (primary) hypertension: Secondary | ICD-10-CM
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
## 2024-06-17 LAB — CMP14+EGFR
ALT: 21 IU/L (ref 0–44)
AST: 25 IU/L (ref 0–40)
Albumin: 4.4 g/dL (ref 3.8–4.8)
Alkaline Phosphatase: 104 IU/L (ref 47–123)
BUN/Creatinine Ratio: 10 (ref 10–24)
BUN: 8 mg/dL (ref 8–27)
Bilirubin Total: 0.2 mg/dL (ref 0.0–1.2)
CO2: 21 mmol/L (ref 20–29)
Calcium: 9.4 mg/dL (ref 8.6–10.2)
Chloride: 105 mmol/L (ref 96–106)
Creatinine, Ser: 0.83 mg/dL (ref 0.76–1.27)
Globulin, Total: 2.3 g/dL (ref 1.5–4.5)
Glucose: 84 mg/dL (ref 70–99)
Potassium: 4.1 mmol/L (ref 3.5–5.2)
Sodium: 141 mmol/L (ref 134–144)
Total Protein: 6.7 g/dL (ref 6.0–8.5)
eGFR: 94 mL/min/1.73

## 2024-06-17 LAB — CBC WITH DIFFERENTIAL/PLATELET
Basophils Absolute: 0 x10E3/uL (ref 0.0–0.2)
Basos: 1 %
EOS (ABSOLUTE): 0.1 x10E3/uL (ref 0.0–0.4)
Eos: 1 %
Hematocrit: 39.1 % (ref 37.5–51.0)
Hemoglobin: 12.5 g/dL — ABNORMAL LOW (ref 13.0–17.7)
Immature Grans (Abs): 0 x10E3/uL (ref 0.0–0.1)
Immature Granulocytes: 0 %
Lymphocytes Absolute: 1.6 x10E3/uL (ref 0.7–3.1)
Lymphs: 42 %
MCH: 28.2 pg (ref 26.6–33.0)
MCHC: 32 g/dL (ref 31.5–35.7)
MCV: 88 fL (ref 79–97)
Monocytes Absolute: 0.6 x10E3/uL (ref 0.1–0.9)
Monocytes: 16 %
Neutrophils Absolute: 1.6 x10E3/uL (ref 1.4–7.0)
Neutrophils: 40 %
Platelets: 217 x10E3/uL (ref 150–450)
RBC: 4.44 x10E6/uL (ref 4.14–5.80)
RDW: 16.1 % — ABNORMAL HIGH (ref 11.6–15.4)
WBC: 3.9 x10E3/uL (ref 3.4–10.8)

## 2024-06-17 LAB — VITAMIN D 25 HYDROXY (VIT D DEFICIENCY, FRACTURES): Vit D, 25-Hydroxy: 50 ng/mL (ref 30.0–100.0)

## 2024-06-17 LAB — HEMOGLOBIN A1C
Est. average glucose Bld gHb Est-mCnc: 151 mg/dL
Hgb A1c MFr Bld: 6.9 % — ABNORMAL HIGH (ref 4.8–5.6)

## 2024-06-17 LAB — VITAMIN B12: Vitamin B-12: 822 pg/mL (ref 232–1245)

## 2024-06-18 ENCOUNTER — Ambulatory Visit: Payer: Self-pay

## 2024-06-18 NOTE — Assessment & Plan Note (Signed)
 Bilateral dry eye syndrome with grittiness and discomfort, similar to pre-surgical condition.

## 2024-06-18 NOTE — Assessment & Plan Note (Signed)
 Abdominal enlargement possibly due to dietary habits or fluid retention. No significant dietary changes, pain, or constipation. Discussed potential CT scan, insurance coverage may be a barrier. - Ordered CT scan of the abdomen to evaluate abdominal enlargement.

## 2024-06-18 NOTE — Assessment & Plan Note (Signed)
 Major neurocognitive disorder with memory issues, resides in an assisted living facility.

## 2024-06-18 NOTE — Assessment & Plan Note (Signed)
 Recheck vitamin  D level today.

## 2024-06-18 NOTE — Assessment & Plan Note (Signed)
 Noted on previous labs. -Repeat A1c ordered today

## 2024-06-29 ENCOUNTER — Other Ambulatory Visit: Payer: Self-pay

## 2024-07-02 ENCOUNTER — Other Ambulatory Visit: Payer: Self-pay | Admitting: Internal Medicine

## 2024-07-06 ENCOUNTER — Other Ambulatory Visit: Payer: Self-pay

## 2024-07-06 ENCOUNTER — Ambulatory Visit: Payer: Self-pay

## 2024-07-06 DIAGNOSIS — G629 Polyneuropathy, unspecified: Secondary | ICD-10-CM

## 2024-07-06 MED ORDER — GABAPENTIN 300 MG PO CAPS: 600.0000 mg | ORAL_CAPSULE | Freq: Three times a day (TID) | ORAL | 1 refills | Status: DC

## 2024-07-06 NOTE — Telephone Encounter (Signed)
 We can increase his dose to 600 mg (2 capsules) 3 times a day.  I sent in a new prescription to his pharmacy
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
## 2024-07-06 NOTE — Telephone Encounter (Signed)
 Patient advised.

## 2024-07-27 ENCOUNTER — Ambulatory Visit: Payer: Self-pay

## 2024-07-28 NOTE — Telephone Encounter (Signed)
 Noted.

## 2024-07-30 ENCOUNTER — Other Ambulatory Visit: Payer: Self-pay

## 2024-07-30 DIAGNOSIS — G629 Polyneuropathy, unspecified: Secondary | ICD-10-CM

## 2024-10-16 ENCOUNTER — Ambulatory Visit: Payer: Self-pay

## 2024-10-19 ENCOUNTER — Ambulatory Visit

## 2024-12-17 ENCOUNTER — Ambulatory Visit: Admitting: Neurology

## 2025-01-11 ENCOUNTER — Ambulatory Visit
